# Patient Record
Sex: Male | Born: 1965
Health system: Southern US, Community
[De-identification: ages and names within clinical notes are randomized; demographics above are authoritative.]

## PROBLEM LIST (undated history)

## (undated) DIAGNOSIS — Z803 Family history of malignant neoplasm of breast: Secondary | ICD-10-CM

## (undated) DIAGNOSIS — Z9889 Other specified postprocedural states: Secondary | ICD-10-CM

## (undated) DIAGNOSIS — T4145XA Adverse effect of unspecified anesthetic, initial encounter: Secondary | ICD-10-CM

## (undated) DIAGNOSIS — Z87442 Personal history of urinary calculi: Secondary | ICD-10-CM

## (undated) DIAGNOSIS — M1990 Unspecified osteoarthritis, unspecified site: Secondary | ICD-10-CM

## (undated) DIAGNOSIS — C61 Malignant neoplasm of prostate: Secondary | ICD-10-CM

## (undated) DIAGNOSIS — R112 Nausea with vomiting, unspecified: Secondary | ICD-10-CM

## (undated) DIAGNOSIS — R51 Headache: Secondary | ICD-10-CM

## (undated) DIAGNOSIS — L0390 Cellulitis, unspecified: Secondary | ICD-10-CM

## (undated) DIAGNOSIS — A159 Respiratory tuberculosis unspecified: Secondary | ICD-10-CM

## (undated) DIAGNOSIS — K219 Gastro-esophageal reflux disease without esophagitis: Secondary | ICD-10-CM

## (undated) DIAGNOSIS — T8859XA Other complications of anesthesia, initial encounter: Secondary | ICD-10-CM

## (undated) DIAGNOSIS — T7840XA Allergy, unspecified, initial encounter: Secondary | ICD-10-CM

## (undated) DIAGNOSIS — Z808 Family history of malignant neoplasm of other organs or systems: Secondary | ICD-10-CM

## (undated) DIAGNOSIS — N200 Calculus of kidney: Secondary | ICD-10-CM

## (undated) DIAGNOSIS — E039 Hypothyroidism, unspecified: Secondary | ICD-10-CM

## (undated) DIAGNOSIS — E785 Hyperlipidemia, unspecified: Secondary | ICD-10-CM

## (undated) HISTORY — PX: TONSILLECTOMY: SUR1361

## (undated) HISTORY — DX: Family history of malignant neoplasm of other organs or systems: Z80.8

## (undated) HISTORY — DX: Allergy, unspecified, initial encounter: T78.40XA

## (undated) HISTORY — PX: SPINE SURGERY: SHX786

## (undated) HISTORY — DX: Calculus of kidney: N20.0

## (undated) HISTORY — DX: Family history of malignant neoplasm of breast: Z80.3

## (undated) HISTORY — DX: Hyperlipidemia, unspecified: E78.5

## (undated) HISTORY — PX: NASAL SEPTOPLASTY W/ TURBINOPLASTY: SHX2070

## (undated) HISTORY — PX: ELBOW SURGERY: SHX618

## (undated) HISTORY — DX: Headache: R51

## (undated) HISTORY — DX: Gastro-esophageal reflux disease without esophagitis: K21.9

## (undated) HISTORY — DX: Cellulitis, unspecified: L03.90

## (undated) HISTORY — DX: Unspecified osteoarthritis, unspecified site: M19.90

## (undated) HISTORY — PX: FRACTURE SURGERY: SHX138

---

## 1997-11-17 HISTORY — PX: MICRODISCECTOMY LUMBAR: SUR864

## 1998-11-17 HISTORY — PX: VASECTOMY: SHX75

## 1999-11-07 ENCOUNTER — Ambulatory Visit (HOSPITAL_COMMUNITY): Admission: RE | Admit: 1999-11-07 | Discharge: 1999-11-07 | Payer: Self-pay | Admitting: Internal Medicine

## 1999-11-07 ENCOUNTER — Encounter: Payer: Self-pay | Admitting: Internal Medicine

## 1999-11-27 ENCOUNTER — Encounter: Payer: Self-pay | Admitting: Neurosurgery

## 1999-11-27 ENCOUNTER — Ambulatory Visit (HOSPITAL_COMMUNITY): Admission: RE | Admit: 1999-11-27 | Discharge: 1999-11-28 | Payer: Self-pay | Admitting: Neurosurgery

## 2000-08-23 ENCOUNTER — Encounter: Payer: Self-pay | Admitting: Neurosurgery

## 2000-08-23 ENCOUNTER — Ambulatory Visit (HOSPITAL_COMMUNITY): Admission: RE | Admit: 2000-08-23 | Discharge: 2000-08-23 | Payer: Self-pay | Admitting: Neurosurgery

## 2001-01-04 ENCOUNTER — Other Ambulatory Visit: Admission: RE | Admit: 2001-01-04 | Discharge: 2001-01-04 | Payer: Self-pay | Admitting: Urology

## 2001-01-04 ENCOUNTER — Encounter (INDEPENDENT_AMBULATORY_CARE_PROVIDER_SITE_OTHER): Payer: Self-pay | Admitting: Specialist

## 2002-09-20 ENCOUNTER — Emergency Department (HOSPITAL_COMMUNITY): Admission: EM | Admit: 2002-09-20 | Discharge: 2002-09-20 | Payer: Self-pay

## 2002-11-02 ENCOUNTER — Encounter: Admission: RE | Admit: 2002-11-02 | Discharge: 2002-11-02 | Payer: Self-pay | Admitting: Neurosurgery

## 2002-11-02 ENCOUNTER — Encounter: Payer: Self-pay | Admitting: Neurosurgery

## 2002-11-15 ENCOUNTER — Encounter: Admission: RE | Admit: 2002-11-15 | Discharge: 2002-11-15 | Payer: Self-pay | Admitting: Neurosurgery

## 2002-11-15 ENCOUNTER — Encounter: Payer: Self-pay | Admitting: Neurosurgery

## 2002-11-17 HISTORY — PX: LUMBAR FUSION: SHX111

## 2002-11-28 ENCOUNTER — Encounter: Payer: Self-pay | Admitting: Neurosurgery

## 2002-11-28 ENCOUNTER — Encounter: Admission: RE | Admit: 2002-11-28 | Discharge: 2002-11-28 | Payer: Self-pay | Admitting: Neurosurgery

## 2002-12-20 ENCOUNTER — Encounter: Payer: Self-pay | Admitting: Neurosurgery

## 2002-12-20 ENCOUNTER — Encounter: Admission: RE | Admit: 2002-12-20 | Discharge: 2002-12-20 | Payer: Self-pay | Admitting: Neurosurgery

## 2003-01-16 ENCOUNTER — Inpatient Hospital Stay (HOSPITAL_COMMUNITY): Admission: RE | Admit: 2003-01-16 | Discharge: 2003-01-19 | Payer: Self-pay | Admitting: Neurosurgery

## 2003-01-16 ENCOUNTER — Encounter: Payer: Self-pay | Admitting: Neurosurgery

## 2003-02-20 ENCOUNTER — Encounter: Admission: RE | Admit: 2003-02-20 | Discharge: 2003-02-20 | Payer: Self-pay | Admitting: Neurosurgery

## 2003-02-20 ENCOUNTER — Encounter: Payer: Self-pay | Admitting: Neurosurgery

## 2003-04-18 ENCOUNTER — Encounter: Admission: RE | Admit: 2003-04-18 | Discharge: 2003-04-18 | Payer: Self-pay | Admitting: Neurosurgery

## 2003-04-18 ENCOUNTER — Encounter: Payer: Self-pay | Admitting: Neurosurgery

## 2003-09-11 ENCOUNTER — Encounter: Payer: Self-pay | Admitting: Family Medicine

## 2003-09-11 ENCOUNTER — Encounter: Admission: RE | Admit: 2003-09-11 | Discharge: 2003-09-11 | Payer: Self-pay | Admitting: Family Medicine

## 2004-10-16 ENCOUNTER — Ambulatory Visit: Payer: Self-pay | Admitting: Internal Medicine

## 2004-10-17 ENCOUNTER — Encounter: Admission: RE | Admit: 2004-10-17 | Discharge: 2004-10-17 | Payer: Self-pay | Admitting: Internal Medicine

## 2005-03-20 ENCOUNTER — Encounter: Admission: RE | Admit: 2005-03-20 | Discharge: 2005-03-20 | Payer: Self-pay | Admitting: Neurosurgery

## 2005-03-23 ENCOUNTER — Encounter: Admission: RE | Admit: 2005-03-23 | Discharge: 2005-03-23 | Payer: Self-pay | Admitting: Neurosurgery

## 2005-12-01 ENCOUNTER — Ambulatory Visit: Payer: Self-pay | Admitting: Family Medicine

## 2006-04-22 ENCOUNTER — Emergency Department (HOSPITAL_COMMUNITY): Admission: EM | Admit: 2006-04-22 | Discharge: 2006-04-22 | Payer: Self-pay | Admitting: Emergency Medicine

## 2006-05-05 ENCOUNTER — Ambulatory Visit: Payer: Self-pay | Admitting: Internal Medicine

## 2008-04-02 ENCOUNTER — Emergency Department (HOSPITAL_COMMUNITY): Admission: EM | Admit: 2008-04-02 | Discharge: 2008-04-02 | Payer: Self-pay | Admitting: Family Medicine

## 2008-08-23 ENCOUNTER — Ambulatory Visit (HOSPITAL_BASED_OUTPATIENT_CLINIC_OR_DEPARTMENT_OTHER): Admission: RE | Admit: 2008-08-23 | Discharge: 2008-08-23 | Payer: Self-pay | Admitting: Orthopedic Surgery

## 2008-11-28 ENCOUNTER — Encounter: Payer: Self-pay | Admitting: Internal Medicine

## 2009-01-12 ENCOUNTER — Ambulatory Visit: Payer: Self-pay | Admitting: Internal Medicine

## 2009-01-12 DIAGNOSIS — Z87442 Personal history of urinary calculi: Secondary | ICD-10-CM | POA: Insufficient documentation

## 2009-01-12 DIAGNOSIS — K219 Gastro-esophageal reflux disease without esophagitis: Secondary | ICD-10-CM | POA: Insufficient documentation

## 2009-01-12 DIAGNOSIS — Z9889 Other specified postprocedural states: Secondary | ICD-10-CM | POA: Insufficient documentation

## 2009-01-12 DIAGNOSIS — J309 Allergic rhinitis, unspecified: Secondary | ICD-10-CM | POA: Insufficient documentation

## 2009-01-12 DIAGNOSIS — J45909 Unspecified asthma, uncomplicated: Secondary | ICD-10-CM | POA: Insufficient documentation

## 2009-01-12 DIAGNOSIS — G43009 Migraine without aura, not intractable, without status migrainosus: Secondary | ICD-10-CM | POA: Insufficient documentation

## 2009-01-12 DIAGNOSIS — E785 Hyperlipidemia, unspecified: Secondary | ICD-10-CM | POA: Insufficient documentation

## 2009-01-16 ENCOUNTER — Encounter: Payer: Self-pay | Admitting: Internal Medicine

## 2009-01-22 ENCOUNTER — Encounter (INDEPENDENT_AMBULATORY_CARE_PROVIDER_SITE_OTHER): Payer: Self-pay | Admitting: *Deleted

## 2009-02-08 ENCOUNTER — Encounter: Payer: Self-pay | Admitting: Internal Medicine

## 2009-09-07 ENCOUNTER — Ambulatory Visit: Payer: Self-pay | Admitting: Family Medicine

## 2009-09-07 ENCOUNTER — Encounter: Payer: Self-pay | Admitting: Internal Medicine

## 2009-09-07 DIAGNOSIS — R1013 Epigastric pain: Secondary | ICD-10-CM | POA: Insufficient documentation

## 2009-09-20 ENCOUNTER — Telehealth (INDEPENDENT_AMBULATORY_CARE_PROVIDER_SITE_OTHER): Payer: Self-pay | Admitting: *Deleted

## 2009-09-25 ENCOUNTER — Ambulatory Visit: Payer: Self-pay | Admitting: Internal Medicine

## 2009-09-25 DIAGNOSIS — B353 Tinea pedis: Secondary | ICD-10-CM | POA: Insufficient documentation

## 2009-11-17 HISTORY — PX: KNEE ARTHROSCOPY: SUR90

## 2010-02-07 ENCOUNTER — Inpatient Hospital Stay (HOSPITAL_COMMUNITY): Admission: EM | Admit: 2010-02-07 | Discharge: 2010-02-09 | Payer: Self-pay | Admitting: Emergency Medicine

## 2010-02-28 ENCOUNTER — Ambulatory Visit: Payer: Self-pay | Admitting: Internal Medicine

## 2010-02-28 DIAGNOSIS — R21 Rash and other nonspecific skin eruption: Secondary | ICD-10-CM | POA: Insufficient documentation

## 2010-03-18 ENCOUNTER — Ambulatory Visit: Payer: Self-pay | Admitting: Internal Medicine

## 2010-03-21 ENCOUNTER — Encounter: Payer: Self-pay | Admitting: Internal Medicine

## 2010-03-26 ENCOUNTER — Encounter: Payer: Self-pay | Admitting: Internal Medicine

## 2010-03-27 ENCOUNTER — Ambulatory Visit: Payer: Self-pay | Admitting: Family Medicine

## 2010-03-27 ENCOUNTER — Encounter: Payer: Self-pay | Admitting: Internal Medicine

## 2010-03-27 DIAGNOSIS — L02619 Cutaneous abscess of unspecified foot: Secondary | ICD-10-CM | POA: Insufficient documentation

## 2010-03-27 DIAGNOSIS — L03119 Cellulitis of unspecified part of limb: Secondary | ICD-10-CM

## 2010-04-03 ENCOUNTER — Telehealth: Payer: Self-pay | Admitting: Internal Medicine

## 2010-04-03 ENCOUNTER — Encounter: Payer: Self-pay | Admitting: Internal Medicine

## 2010-04-10 ENCOUNTER — Encounter: Payer: Self-pay | Admitting: Internal Medicine

## 2010-05-17 ENCOUNTER — Encounter: Payer: Self-pay | Admitting: Internal Medicine

## 2010-08-16 ENCOUNTER — Encounter: Payer: Self-pay | Admitting: Internal Medicine

## 2010-08-25 ENCOUNTER — Emergency Department (HOSPITAL_COMMUNITY): Admission: EM | Admit: 2010-08-25 | Discharge: 2010-08-25 | Payer: Self-pay | Admitting: Emergency Medicine

## 2010-09-11 ENCOUNTER — Telehealth: Payer: Self-pay | Admitting: Internal Medicine

## 2010-09-23 ENCOUNTER — Encounter: Payer: Self-pay | Admitting: Internal Medicine

## 2010-12-09 ENCOUNTER — Encounter: Payer: Self-pay | Admitting: Internal Medicine

## 2010-12-15 LAB — CONVERTED CEMR LAB
AST: 39 units/L — ABNORMAL HIGH (ref 0–37)
Alkaline Phosphatase: 52 units/L (ref 39–117)
BUN: 11 mg/dL (ref 6–23)
BUN: 12 mg/dL (ref 6–23)
Basophils Absolute: 0 10*3/uL (ref 0.0–0.1)
Basophils Relative: 0.8 % (ref 0.0–3.0)
CO2: 31 meq/L (ref 19–32)
CO2: 32 meq/L (ref 19–32)
Chloride: 102 meq/L (ref 96–112)
Cholesterol, target level: 200 mg/dL
Cholesterol: 229 mg/dL — ABNORMAL HIGH (ref 0–200)
Creatinine, Ser: 0.9 mg/dL (ref 0.4–1.5)
Eosinophils Absolute: 0.3 10*3/uL (ref 0.0–0.7)
Eosinophils Relative: 3 % (ref 0–5)
Eosinophils Relative: 5.4 % — ABNORMAL HIGH (ref 0.0–5.0)
GFR calc Af Amer: 119 mL/min
GFR calc non Af Amer: 98 mL/min
Glucose, Bld: 93 mg/dL (ref 70–99)
HCT: 44 % (ref 39.0–52.0)
HCT: 44.5 % (ref 39.0–52.0)
HCT: 45.7 % (ref 39.0–52.0)
HDL: 40.7 mg/dL (ref >39.00)
Helicobacter Pylori Antibody-IgG: <0.4
Hemoglobin: 15.1 g/dL (ref 13.0–17.0)
Hemoglobin: 15.8 g/dL (ref 13.0–17.0)
Indirect Bilirubin: 0.4 mg/dL (ref 0.0–0.9)
LDL Goal: 100 mg/dL
Lymphocytes Relative: 27 % (ref 12–46)
Lymphs Abs: 2.1 10*3/uL (ref 0.7–4.0)
MCHC: 34.3 g/dL (ref 30.0–36.0)
MCV: 89.6 fL (ref 78.0–100.0)
Monocytes Relative: 15.2 % — ABNORMAL HIGH (ref 3.0–12.0)
Monocytes Relative: 9.9 % (ref 3.0–12.0)
Neutro Abs: 4.5 10*3/uL (ref 1.7–7.7)
Neutrophils Relative %: 34.9 % — ABNORMAL LOW (ref 43.0–77.0)
Platelets: 322 10*3/uL (ref 150–400)
Potassium: 4.2 meq/L (ref 3.5–5.1)
RBC: 4.83 M/uL (ref 4.22–5.81)
RBC: 5.06 M/uL (ref 4.22–5.81)
RDW: 12.8 % (ref 11.5–14.6)
Sodium: 140 meq/L (ref 135–145)
TSH: 1.55 microintl units/mL (ref 0.35–5.50)
TSH: 2.05 microintl units/mL (ref 0.35–5.50)
Total Bilirubin: 0.5 mg/dL (ref 0.3–1.2)
Total Bilirubin: 0.8 mg/dL (ref 0.3–1.2)
Total CHOL/HDL Ratio: 6
Total Protein: 6.6 g/dL (ref 6.0–8.3)
Triglycerides: 159 mg/dL — ABNORMAL HIGH (ref 0.0–149.0)
WBC: 4.7 10*3/uL (ref 4.5–10.5)
WBC: 7.8 10*3/uL (ref 4.5–10.5)
WBC: 8 10*3/uL (ref 4.0–10.5)

## 2010-12-19 NOTE — Assessment & Plan Note (Signed)
Summary: RT FOOT INFECTION/RH.......[name]   Vital Signs:  Patient profile:   45 year old male Weight:      186 pounds Temp:     98.2 degrees F oral BP sitting:   120 / 80  (left arm)  Vitals Entered By: Doristine Devoid (Mar 27, 2010 11:27 AM) CC: R foot infection- used ketoconzale cream which made worse some itching and discomfort- hx of recent staph infection    History of Present Illness: 45 yo man here today for R foot infxn.  in March flew to [place] and 'my foot blew up'.  had severe staph infxn.  April had recurrent infxn.  last week had CPE- has derm appt upcoming.  area is now oozing, hot, itching, burning.  + swelling.  took Bactrim in April w/ resolution of sxs.  used Ketoconazole cream and felt top of foot was 'on fire'.  area now again 'flared up'.  no fevers.  Current Medications (verified): 1)  Allegra 180 Mg Tabs (Fexofenadine Hcl) .[name].. 1 By Mouth Once Daily 2)  Aspirin Adult Low Strength 81 Mg Tbec (Aspirin) .[name].. 1 By Mouth Once Daily 3)  Nasonex 50 Mcg/act Susp (Mometasone Furoate) .... As Needed 4)  Proair Hfa 108 (90 Base) Mcg/act Aers (Albuterol Sulfate) .... As Needed 5)  Epipen 0.3 Mg/0.21ml (1:1000) Devi (Epinephrine Hcl (Anaphylaxis)) .... As Needed 6)  Ibuprofen 800 Mg Tabs (Ibuprofen) .[name].. 1 By Mouth Once Daily 7)  Allergy Injections .[name].. 1 Injection 2 X Weekly 8)  Ranitidine Hcl 150 Mg Tabs (Ranitidine Hcl) .[name].. 1 Two Times A Day Ac Meals 9)  Maxalt-Mlt 10 Mg Tbdp (Rizatriptan Benzoate) .[name].. 1 As Needed 10)  Pravastatin Sodium 20 Mg Tabs (Pravastatin Sodium) .[name].. 1 At Bedtime 11)  Cephalexin 500 Mg  Tabs (Cephalexin) .... Take One By Mouth Two Times A Day X 10 Days  Allergies (verified): No Known Drug Allergies  Review of Systems      See HPI  Physical Exam  General:  well-nourished, alert,appropriate and cooperative throughout examination Pulses:  +2 DP pulses Extremities:  + erythema, induration, some vesicles.  + serous drainage.   Impression &  Recommendations:  Problem # 1:  CELLULITIS, FOOT (ICD-682.7) Assessment New pt w/ apparent re-infxn of dorsum of R foot.  doesn't appear to be MRSA.  cx in hospital was (-) for MRSA.  will not refill Bactrim as pt stopped this 2 weeks ago.  need to save MRSA fighting meds.  will start Keflex.  reviewed supportive care and red flags that should prompt return.  Pt expresses understanding and is in agreement w/ this plan. His updated medication list for this problem includes:    Cephalexin 500 Mg Tabs (Cephalexin) .[name]... Take one by mouth two times a day x 10 days  Complete Medication List: 1)  Allegra 180 Mg Tabs (Fexofenadine hcl) .[name].. 1 by mouth once daily 2)  Aspirin Adult Low Strength 81 Mg Tbec (Aspirin) .[name].. 1 by mouth once daily 3)  Nasonex 50 Mcg/act Susp (Mometasone furoate) .... As needed 4)  Proair Hfa 108 (90 Base) Mcg/act Aers (Albuterol sulfate) .... As needed 5)  Epipen 0.3 Mg/0.31ml (1:1000) Devi (Epinephrine hcl (anaphylaxis)) .... As needed 6)  Ibuprofen 800 Mg Tabs (Ibuprofen) .[name].. 1 by mouth once daily 7)  Allergy Injections  .[name].. 1 injection 2 x weekly 8)  Ranitidine Hcl 150 Mg Tabs (Ranitidine hcl) .[name].. 1 two times a day ac meals 9)  Maxalt-mlt 10 Mg Tbdp (Rizatriptan benzoate) .[name].. 1 as  needed 10)  Pravastatin Sodium 20 Mg Tabs (Pravastatin sodium) .[name].. 1 at bedtime 11)  Cephalexin 500 Mg Tabs (Cephalexin) .... Take one by mouth two times a day x 10 days  Patient Instructions: 1)  Start the antibiotics today- take with food 2)  Take oral benadryl for a possible allergic component 3)  DO NOT smear any creams on your foot 4)  If your redness is spreading, streaking, you develop fevers or other concerns- please call or go to the ER 5)  HANG IN THERE!! Prescriptions: CEPHALEXIN 500 MG  TABS (CEPHALEXIN) take one by mouth two times a day x 10 days  #20 x 0   Entered and Authorized by:   Neena Rhymes MD   Signed by:   Neena Rhymes MD on 03/27/2010   Method used:    Electronically to        [facility] [address] (retail)       [address]       [place], [place]  [address]       Ph: [phone]       Fax: [phone]   RxID:   [id number]

## 2010-12-19 NOTE — Letter (Signed)
Summary: [facility]  [facility]   Imported By: Lanelle Bal 04/01/2010 12:26:27  _____________________________________________________________________  External Attachment:    Type:   Image     Comment:   External Document

## 2010-12-19 NOTE — Consult Note (Signed)
Summary: [facility]   Imported By: Lanelle Bal 05/29/2010 09:26:11  _____________________________________________________________________  External Attachment:    Type:   Image     Comment:   External Document

## 2010-12-19 NOTE — Letter (Signed)
Summary: [facility]  [facility]   Imported By: Lanelle Bal 10/18/2010 11:43:28  _____________________________________________________________________  External Attachment:    Type:   Image     Comment:   External Document

## 2010-12-19 NOTE — Letter (Signed)
Summary: E Mail Regarding Dermatology Consult  E Mail Regarding Dermatology Consult   Imported By: Lanelle Bal 04/20/2010 09:12:36  _____________________________________________________________________  External Attachment:    Type:   Image     Comment:   External Document

## 2010-12-19 NOTE — Progress Notes (Signed)
Summary: earlier appt  Phone Note Call from Patient Call back at [phone] CELL [phone]   Caller: Patient Summary of Call: PT wife is REQUEST A EARLIER APPT for pt  DUE TO INFECTION RECURRENT. Pt is on his third round of antibiotic and was almost hospitalized on friday. Pt wife would like for dr Sacheen Arrasmith to try to get pt in earlier .pls advise.....................Marland KitchenFelecia Deloach CMA  Apr 03, 2010 10:17 AM   Follow-up for Phone Call        He gets recurrent rash on his foot followed by secondary cellulitis. He was hospitalized X 1 & almost a second time. His wife is Engineer, civil (consulting); he is Emergency planning/management officer; very intelligent & motivated. Appt ASAP please. Will MD to MD call help? Thanks Follow-up by: Marga Melnick MD,  Apr 03, 2010 11:47 AM  Additional Follow-up for Phone Call Additional follow up Details #1::        I placed a Triage call to [facility], awaiting call back with their physician's approval for pt to be worked in earlier. Magdalen Spatz [facility]  Apr 03, 2010 11:56 AM     Additional Follow-up for Phone Call Additional follow up Details #2::    PATIENT'S APPT HAS BEEN MOVED UP TO 04-09-2010 ARRIVING NO LATER THAN 3:30PM WITH DR DAN Yetta Barre, I S/W PT'S WIFE SHE IS Beverley Fiedler [facility]  Apr 04, 2010 8:54 AM   Follow-up by: Marga Melnick MD,  Apr 04, 2010 6:27 PM

## 2010-12-19 NOTE — Letter (Signed)
Summary: [facility]  [facility]   Imported By: Lanelle Bal 04/01/2010 12:30:39  _____________________________________________________________________  External Attachment:    Type:   Image     Comment:   External Document

## 2010-12-19 NOTE — Letter (Signed)
Summary: E Mail with Patient Concerns  E Mail with Patient Concerns   Imported By: Lanelle Bal 04/10/2010 08:20:18  _____________________________________________________________________  External Attachment:    Type:   Image     Comment:   External Document

## 2010-12-19 NOTE — Letter (Signed)
Summary: [facility]  [facility]   Imported By: Lanelle Bal 09/10/2010 10:59:26  _____________________________________________________________________  External Attachment:    Type:   Image     Comment:   External Document
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
## 2010-12-19 NOTE — Assessment & Plan Note (Signed)
Summary: [facility]   Vital Signs:  Patient profile:   45 year old male Weight:      185 pounds BMI:     25.53 Pulse rate:   60 / minute Resp:     15 per minute BP sitting:   106 / 70  (left arm) Cuff size:   large  Vitals Entered By: Shonna Chock (February 28, 2010 11:27 AM) CC: Hospital Follow-up: patient was doing ok then 2-3 days ago symptoms with right foot restarted(Staph Infection), patient contacted the ER and was RX'ed another round of Bactrim for 14days. Patient also noticed an area of concern on his left foot Comments REVIEWED MED LIST, PATIENT AGREED DOSE AND INSTRUCTION CORRECT    CC:  Hospital Follow-up: patient was doing ok then 2-3 days ago symptoms with right foot restarted(Staph Infection) and patient contacted the ER and was RX'ed another round of Bactrim for 14days. Patient also noticed an area of concern on his left foot.  History of Present Illness: D/C Summary reviewed ; 03/24-26/2011 for parenteral antibiotics  for cellulitis of R foot. The rash essentially cleared until 04/12  when it began to recur as erythema. PMH of contact dermatitis with adhesive medical dressings in 2001, Neosporin, anti fungal cream  & reaction to sutures   Allergies (verified): No Known Drug Allergies  Review of Systems General:  Denies chills, fever, and sweats. CV:  Denies bluish discoloration of lips or nails and leg cramps with exertion. Derm:  Complains of changes in color of skin and itching; No purulence. Allergy:  Denies itching eyes and sneezing; On allergy shots from Dr Barnetta Chapel.  Physical Exam  General:  in no acute distress; alert,appropriate and cooperative throughout examination Abdomen:  Bowel sounds positive,abdomen soft and non-tender without masses, organomegaly or hernias noted. Pulses:  R and L femoral,dorsalis pedis and posterior tibial pulses are full and equal bilaterally Extremities:  No clubbing, cyanosis, edema. Good nail health Skin:  7X 11 cm  erythema with blanching to pressure over  R dorsal foot w/o increased temp  Cervical Nodes:  No lymphadenopathy noted Axillary Nodes:  No palpable lymphadenopathy Inguinal Nodes:  No significant adenopathy   Impression & Recommendations:  Problem # 1:  RASH-NONVESICULAR (ICD-782.1)  PMH of contact dermatitis with multiple agents; clinically not cellulitis  Orders: Dermatology Referral (Derma)  Complete Medication List: 1)  Allegra 180 Mg Tabs (Fexofenadine hcl) .[name].. 1 by mouth once daily 2)  Aspirin Adult Low Strength 81 Mg Tbec (Aspirin) .[name].. 1 by mouth once daily 3)  Nasonex 50 Mcg/act Susp (Mometasone furoate) .... As needed 4)  Proair Hfa 108 (90 Base) Mcg/act Aers (Albuterol sulfate) .... As needed 5)  Epipen 0.3 Mg/0.46ml (1:1000) Devi (Epinephrine hcl (anaphylaxis)) .... As needed 6)  Ibuprofen 800 Mg Tabs (Ibuprofen) .[name].. 1 by mouth once daily 7)  Allergy Injections  .[name].. 1 injection 2 x weekly 8)  Ranitidine Hcl 150 Mg Tabs (Ranitidine hcl) .[name].. 1 two times a day ac meals 9)  Maxalt-mlt 10 Mg Tbdp (Rizatriptan benzoate) .[name].. 1 as needed 10)  Bactrim Ds 800-160 Mg Tabs (Sulfamethoxazole-trimethoprim) .[name].. 1 by mouth two times a day x 14 days. started: 02/26/10  Patient Instructions: 1)  Wear sandals weather permitting until rash resolves. Mix Eucerin1 part to 1 part Cortaid  & apply two times a day for itching as needed . Wet to dry saline two times a day- three times a day

## 2010-12-19 NOTE — Progress Notes (Signed)
Summary: FYI:   Phone Note Call from Patient   Caller: Patient Summary of Call: Message left on VM: Patient went to [facility] yesterday for an anaphylactic reaction to an allergy injection. Patient was told to contact his primary care doctor.    I called the patient to inform him we got his message. Patient indicated he has a f/u with Dr.Whelan  Shonna Chock CMA  September 11, 2010 1:09 PM   Follow-up for Phone Call        noted Follow-up by: Marga Melnick MD,  September 11, 2010 2:14 PM

## 2010-12-20 NOTE — Letter (Signed)
Summary: [facility]  [facility]   Imported By: Lanelle Bal 04/01/2010 12:27:56  _____________________________________________________________________  External Attachment:    Type:   Image     Comment:   External Document

## 2010-12-25 NOTE — Letter (Signed)
Summary: [facility]  [facility]   Imported By: Lanelle Bal 12/19/2010 08:09:59  _____________________________________________________________________  External Attachment:    Type:   Image     Comment:   External Document

## 2011-01-30 LAB — URINALYSIS, ROUTINE W REFLEX MICROSCOPIC
Bilirubin Urine: NEGATIVE
Specific Gravity, Urine: 1.004 — ABNORMAL LOW (ref 1.005–1.030)
Urobilinogen, UA: 0.2 mg/dL (ref 0.0–1.0)
pH: 6.5 (ref 5.0–8.0)

## 2011-02-10 LAB — CBC
HCT: 44.6 % (ref 39.0–52.0)
MCHC: 34.3 g/dL (ref 30.0–36.0)
MCV: 90.7 fL (ref 78.0–100.0)
Platelets: 287 10*3/uL (ref 150–400)
WBC: 8.4 10*3/uL (ref 4.0–10.5)

## 2011-02-10 LAB — WOUND CULTURE: Gram Stain: NONE SEEN

## 2011-02-10 LAB — DIFFERENTIAL
Basophils Relative: 1 % (ref 0–1)
Eosinophils Relative: 6 % — ABNORMAL HIGH (ref 0–5)
Lymphocytes Relative: 22 % (ref 12–46)
Neutro Abs: 4.9 10*3/uL (ref 1.7–7.7)
Neutrophils Relative %: 58 % (ref 43–77)

## 2011-02-10 LAB — BASIC METABOLIC PANEL
BUN: 12 mg/dL (ref 6–23)
CO2: 31 mEq/L (ref 19–32)
Calcium: 8.6 mg/dL (ref 8.4–10.5)
Creatinine, Ser: 1.09 mg/dL (ref 0.4–1.5)
Potassium: 3.6 mEq/L (ref 3.5–5.1)
Sodium: 136 mEq/L (ref 135–145)

## 2011-04-01 NOTE — Op Note (Signed)
NAMECOULTER, OLDAKER NO.:  [id number]   MEDICAL RECORD NO.:  [id number]          PATIENT TYPE:  AMB   LOCATION:  [facility]                         FACILITY:  [facility]   PHYSICIAN:  Ollen Gross, M.D.    DATE OF BIRTH:  02-Sep-1966   DATE OF PROCEDURE:  08/23/2008  DATE OF DISCHARGE:                               OPERATIVE REPORT   PREOPERATIVE DIAGNOSES:  Right knee patellar chondral defect.   POSTOPERATIVE DIAGNOSES:  Right knee patellar chondral defect.   PROCEDURE:  Right-knee arthroscopy with chondroplasty patella.   SURGEON:  Ollen Gross, M.D.   ASSISTANT:  None.   ANESTHESIA:  General.   ESTIMATED BLOOD LOSS:  Minimal.   DRAINS:  None.   COMPLICATIONS:  None.   CONDITION:  Stable to recovery.   BRIEF CLINICAL NOTE:  Josph is a 45 year old male who has had a several-  month history of progressively worsening right knee pain and mechanical  type symptoms.  He has had extensive physical therapy without  improvement.  Most of his symptoms appear referable to the  patellofemoral joint.  He has been unable to return to his occupation  because of the pain and dysfunction.  He presents now for arthroscopy  and debridement.   PROCEDURE IN DETAIL:  After successful administration of general  anesthetic, a tourniquet was placed high on the right thigh and right  lower extremity prepped and draped in the usual sterile fashion.  Standard superomedial and inferolateral incisions were made.  Inflow  cannula passed, superomedial camera passed, inferolateral.  Arthroscopic  visualization proceeds.   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  There is also some associated  hypertrophic fat and synovial tissue just adjacent to the junction of  the quad tendon and the patella and also at the inferior aspect of the  junction of the patellar tendon and patella.  The trochlea looks  completely  normal.  The medial and lateral gutters are visualized.  There are no loose bodies.  Flexion in valgus force is applied to the  knee and the medial compartment is entered.  The medial meniscus looks  normal, as does the medial femoral condyle and tibial plateau.  The  spinal needle is used to localize the inferomedial portal, a small  incision made, dilator placed.  A probe is placed and the meniscus  probes normally.  The intercondylar notch is visualized.  The ACL is  normal.  Lateral compartment is entered and it looks normal also.   I again addressed the patellofemoral joint.  Using the 4.2 mm shaver,  the area of chondromalacia, superior and inferior, is debrided back to a  stable cartilaginous base.  There is no exposed bone.  I then used the  ArthroCare device to debride the hypertrophic tissue just adjacent to  the superior and inferior pole of the patella, which potentially was  impinging in flexion.  This hypertrophic tissue is removed.  Superolateral, there is also what appears to be a plica and that is  excised.  The joint is again inspected.  There are no further tears or  loose bodies.  The patella is centrally located in the trochlea.  Thus a  lateral release was not necessary.   Once the impinging tissue was removed, then I removed the arthroscopic  equipment from the inferior portals, which were closed with interrupted  4-0 nylon.  Then 20 mL of 0.25% Marcaine with epinephrine were injected  for the inflow cannula, then that was removed and that portal closed  with nylon.  A bulky sterile dressing is applied and he is then awakened  and transported to recovery in stable condition.      Ollen Gross, M.D.  Electronically Signed     FA/MEDQ  D:  08/23/2008  T:  08/23/2008  Job:  [id number]
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
## 2011-04-04 NOTE — Op Note (Signed)
[place]  Patient:    Aaron Gomez                          MRN: [id number] Proc. Date: 11/27/99 Adm. Date:  [id number] Attending:  Donn Pierini                           Operative Report  PREOPERATIVE DIAGNOSES:  1. Left L4-5 extra foraminal herniated nucleus pulposus with radiculopathy. 2.  Left L5-S1 herniated nucleus pulposus with a superior free fragment causing  radiculopathy.  POSTOPERATIVE DIAGNOSES:  1. Left L4-5 extra foraminal herniated nucleus pulposus with radiculopathy. 2.  Left L5-S1 herniated nucleus pulposus with a superior free fragment causing  radiculopathy.  OPERATION:  Left L4-5 extra foraminal microdiskectomy. Left L5-S1 laminotomy and microdiskectomy.  SURGEON:  Julio Sicks, M.D.  ASSISTANT:  Bradd Canary., M.D.  ANESTHESIA:  General endotracheal.  INDICATION FOR PROCEDURE:  Mr. Savant is a 45 year old white male with a history of back and left lower extremity pain and paresthesias consistent with both a left L4 and L5 radiculopathy and some intermittent symptoms of left S1 radiculopathy. he patient has failed efforts of conservative management over the past six months.  MRI scan demonstrates a focal extra foraminal disc herniation with compression f the left sided L4 nerve root at L4-5.  The patient also has evidence of a left sided L5-S1 disc herniation with a superior free fragment causing compression of the left sided L5 nerve root.  The patient has been counselled as to his options and wishes to proceed with lumbar laminotomy and microdiskectomy at L5-S1 on the left and extra foraminal microdiskectomy at L4-5 on the left for hopeful relief of his symptoms.  DESCRIPTION OF PROCEDURE:  The patient was taken to the operating room and placed on the operating table in the supine position.  After endotracheal anesthesia, he patient positioned prone on to a Wilson frame properly  padded.  The patients lumbar region is shaved and prepped sterilely.  A ten blade is used to make a linear skin incision overlying the L4-5 interspace.  This was carried down sharply in the midline. Subperiosteal dissection then performed on the left side exposing the lamina and facet joints at L4, L5 and S1.  The transverse processes of L4 was also exposed. Deep self retaining retractor was placed.  X-ray was taken.  The levels were confirmed.  Laminotomy was then performed at L5-S1 using high speed  drill and Kerrison rongeurs to move the inferior one-third of the lamina of L5. The medial one-third of the L5-S2 fact and the superior rim of the sacrum. The ligamentum flavum was elevated and resected in a piece meal fashion using Kerrison rongeurs.  An extra foraminal approach was then performed at L4-5 by using the igh speed drill and Kerrison rongeurs to remove the inferior aspect of the proximal  transverse process and pedicle.  Also the superior lateral aspect of the superior facet of L5 was also resected.  Soft tissue was then elevated and resected in a  piece meal fashion using Kerrison rongeurs.  Underlying L4 nerve root was identified.  Using the microscope for microdissection of the left sided L4 nerve root, the nerve root was mobilized and retracted superiorly.  Venous and fatty structures were coagulated and cut.  Disc herniation was isolated and incised in a 15 blade rectangular fashion.  A very laterally  placed disc herniation which was very tough and adherent was dissected free and resected in a piece meal fashion  using Kerrison rongeurs and pituitary rongeurs.  All elements of the disc herniation were resected.  Bone clamps passed easily along the course of the left sided L4 nerve root.  There was no evidence of injury to the nerve root. Gelfoam was placed topically for hemostasis and attention was then placed to L5-S1. The L5-S1 interspace was identified.  Thecal sac and S2 nerve root were immobilized nd retracted towards the midline. Epidural venous plexus was coagulated and cut. he disc space was isolated and incised with a 15 blade in a rectangular fashion. A  wide disc space clean out was then achieved using pituitary rongeurs, upward angle pituitary rongeurs and Ebstein curets.  A superior fragment mixed with osteophyte was then encountered and dissected free using microinstruments and Ebstein curets. This completely decompressed the left sided L5 nerve root.  At this point, good  decompression of the left L4, L5 and S1 nerve roots had been performed.  The wound was then copiously irrigated with antibiotic solution. Gelfoam was placed topically for hemostasis which was found to be good.  Microscope and retractor system were removed.  Hemostasis of the muscle was achieved with electrocautery. The wound as then closed in layers with Vicryl sutures. Staples were applied to the surface.  There were no apparent complications.  The patient tolerated the procedure well and he returns to the recovery room postoperatively. DD:  11/27/99 TD:  11/27/99 Job: [id number]
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
## 2011-04-18 ENCOUNTER — Other Ambulatory Visit: Payer: Self-pay | Admitting: Internal Medicine

## 2011-05-20 ENCOUNTER — Encounter: Payer: Self-pay | Admitting: Internal Medicine

## 2011-05-23 ENCOUNTER — Encounter: Payer: Self-pay | Admitting: Internal Medicine

## 2011-05-23 ENCOUNTER — Ambulatory Visit (INDEPENDENT_AMBULATORY_CARE_PROVIDER_SITE_OTHER): Payer: BC Managed Care – PPO | Admitting: Internal Medicine

## 2011-05-23 VITALS — BP 104/68 | HR 56 | Temp 98.4°F | Resp 12 | Ht 72.0 in | Wt 178.6 lb

## 2011-05-23 DIAGNOSIS — Z0000 Encounter for general adult medical examination without abnormal findings: Secondary | ICD-10-CM

## 2011-05-23 DIAGNOSIS — E785 Hyperlipidemia, unspecified: Secondary | ICD-10-CM

## 2011-05-23 DIAGNOSIS — T782XXA Anaphylactic shock, unspecified, initial encounter: Secondary | ICD-10-CM

## 2011-05-23 DIAGNOSIS — J309 Allergic rhinitis, unspecified: Secondary | ICD-10-CM

## 2011-05-23 LAB — CBC WITH DIFFERENTIAL/PLATELET
Basophils Relative: 0.5 % (ref 0.0–3.0)
Eosinophils Relative: 2.2 % (ref 0.0–5.0)
Hemoglobin: 15.5 g/dL (ref 13.0–17.0)
Lymphocytes Relative: 30.1 % (ref 12.0–46.0)
MCHC: 34.6 g/dL (ref 30.0–36.0)
Monocytes Relative: 9.6 % (ref 3.0–12.0)
Neutro Abs: 4.5 10*3/uL (ref 1.4–7.7)
Neutrophils Relative %: 57.6 % (ref 43.0–77.0)
Platelets: 280 10*3/uL (ref 150.0–400.0)
RBC: 4.92 Mil/uL (ref 4.22–5.81)
RDW: 12.4 % (ref 11.5–14.6)
WBC: 7.7 10*3/uL (ref 4.5–10.5)

## 2011-05-23 LAB — LIPID PANEL
HDL: 51.1 mg/dL (ref >39.00)
LDL Cholesterol: 91 mg/dL (ref 0–99)
VLDL: 35.4 mg/dL (ref 0.0–40.0)

## 2011-05-23 LAB — BASIC METABOLIC PANEL
CO2: 31 mEq/L (ref 19–32)
Creatinine, Ser: 1.1 mg/dL (ref 0.4–1.5)
Glucose, Bld: 85 mg/dL (ref 70–99)

## 2011-05-23 LAB — HEPATIC FUNCTION PANEL
Bilirubin, Direct: 0 mg/dL (ref 0.0–0.3)
Total Bilirubin: 0.4 mg/dL (ref 0.3–1.2)

## 2011-05-23 MED ORDER — PRAVASTATIN SODIUM 20 MG PO TABS: 20.0000 mg | ORAL_TABLET | Freq: Every day | ORAL | Status: DC

## 2011-05-23 NOTE — Patient Instructions (Signed)
Preventive Health Care: Exercise at least 30-45 minutes a day,  3-4 days a week.  Eat a low-fat diet with lots of fruits and vegetables, up to 7-9 servings per day. Consume less than 40 grams of sugar per day from foods & drinks with High Fructose Corn Sugar as #2,3 or # 4 on label. Health Care Power of Attorney & Living Will. Complete if not in place ; these place you in charge of your health care decisions. 

## 2011-05-23 NOTE — Progress Notes (Signed)
Subjective:    Patient ID: Aaron Gomez, male    DOB: 1966/07/08, 45 y.o.   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      Review of Systems Patient reports no  Significant vision/ hearing changes,anorexia, weight change, fever ,adenopathy, persistant / recurrent hoarseness, swallowing issues, chest pain,palpitations, edema,persistant / recurrent cough, hemoptysis, dyspnea(rest, exertional, paroxysmal nocturnal), gastrointestinal  bleeding (melena, rectal bleeding), abdominal pain, excessive heart burn, GU symptoms( dysuria, hematuria, pyuria, voiding  issues) syncope, focal weakness,numbness & tingling, hair/nail changes,depression, anxiety, abnormal bruising/bleeding, or  musculoskeletal symptoms/signs.      Objective:   Physical Exam Gen.: Healthy and well-nourished in appearance. Alert, appropriate and cooperative throughout exam. Head: Normocephalic without obvious abnormalities;  no alopecia  Eyes: No corneal or conjunctival inflammation noted. Pupils equal round reactive to light and accommodation. Fundal exam is benign without hemorrhages, exudate, papilledema. Extraocular motion intact. Vision grossly normal. Ears: External  ear exam reveals no significant lesions or deformities. Canals clear .TMs normal. Hearing is grossly normal bilaterally. Nose: External nasal exam reveals no deformity or inflammation. Nasal mucosa are pink and moist. No lesions or exudates noted. Septum : no deviation Mouth: Oral mucosa and oropharynx reveal no lesions or exudates. Teeth in good repair. Neck: No deformities, masses, or tenderness noted. Range of motion  normal. Thyroid R lobe physiologically larger than L & firm w/o nodules. ?cervical rib Lungs: Normal respiratory effort; chest expands symmetrically. Lungs are clear to auscultation without rales, wheezes, or increased work of breathing. Heart:  Slow rate and regular  rhythm.  Normal S1 and S2. No gallop, click, or rub. No  murmur. Abdomen: Bowel sounds normal; abdomen soft and nontender. No masses, organomegaly or hernias noted. Genitalia/ DRE: prostate ULN w/o nodules. L varicocele.   .                                                                                   Musculoskeletal/extremities: No deformity or scoliosis noted of  the thoracic or lumbar spine. No clubbing, cyanosis, edema, or deformity noted. Range of motion  normal .Tone & strength  normal.Joints normal. Nail health  good. Vascular: Carotid, radial artery, dorsalis pedis and  posterior tibial pulses are full and equal. No bruits present. Neurologic: Alert and oriented x3. Deep tendon reflexes symmetrical and normal.          Skin: dry , slightly exfoliative rash base R great toe Lymph: No cervical, axillary, or inguinal lymphadenopathy present. Psych: Mood and affect are normal. Normally interactive                                                                                        Assessment & Plan:  #1 comprehensive physical exam; no acute findings #2 see Problem  List with Assessments & Recommendations Plan: see Orders

## 2011-11-12 ENCOUNTER — Telehealth: Payer: Self-pay | Admitting: Internal Medicine

## 2011-11-12 MED ORDER — OSELTAMIVIR PHOSPHATE 75 MG PO CAPS: 75.0000 mg | ORAL_CAPSULE | Freq: Two times a day (BID) | ORAL | Status: AC

## 2011-11-12 NOTE — Telephone Encounter (Signed)
Tamiflu 75 mg one twice a day; dispense 10

## 2011-11-12 NOTE — Telephone Encounter (Signed)
Done. Patient informed

## 2011-11-12 NOTE — Telephone Encounter (Signed)
Patient has temp - body ache  & headache - he is a Emergency planning/management officer in [employer] & saw city nurse she suggested he get rx for tama-flu -she said she saw about 4 other people with same symptoms today [facility]

## 2012-02-02 ENCOUNTER — Ambulatory Visit (INDEPENDENT_AMBULATORY_CARE_PROVIDER_SITE_OTHER): Payer: BC Managed Care – PPO | Admitting: Internal Medicine

## 2012-02-02 ENCOUNTER — Encounter: Payer: Self-pay | Admitting: Internal Medicine

## 2012-02-02 VITALS — BP 124/86 | HR 62 | Temp 98.1°F | Resp 14 | Ht 72.0 in | Wt 184.0 lb

## 2012-02-02 DIAGNOSIS — Z23 Encounter for immunization: Secondary | ICD-10-CM

## 2012-02-02 DIAGNOSIS — Z0000 Encounter for general adult medical examination without abnormal findings: Secondary | ICD-10-CM

## 2012-02-02 DIAGNOSIS — E785 Hyperlipidemia, unspecified: Secondary | ICD-10-CM

## 2012-02-02 NOTE — Patient Instructions (Addendum)
Preventive Health Care: Exercise at least 30-45 minutes a day,  3-4 days a week.  Eat a low-fat diet with lots of fruits and vegetables, up to 7-9 servings per day. Consume less than 40 grams of sugar per day from foods & drinks with High Fructose Corn Sugar as # 1,2,3 or # 4 on label. Health Care Power of Attorney & Living Will. Complete if not in place ; these place you in charge of your health care decisions. Please take enteric-coated aspirin 81 mg daily with breakfast.

## 2012-02-02 NOTE — Progress Notes (Signed)
  Subjective:    Patient ID: Aaron Gomez, male    DOB: September 10, 1966, 46 y.o.   MRN: [id number]  HPI  Mr. Czarnecki is here for a physical; he denies acute issues except for recent blistering of soles after 14 mile hike with heavy backpack.      Review of Systems Patient reports no  vision/ hearing changes,anorexia, weight change, fever ,adenopathy, persistant / recurrent hoarseness, swallowing issues, chest pain,palpitations, edema,persistant / recurrent cough, hemoptysis, dyspnea(rest, exertional, paroxysmal nocturnal), gastrointestinal  bleeding (melena, rectal bleeding), abdominal pain, excessive heart burn, GU symptoms( dysuria, hematuria, pyuria, voiding/incontinence  issues) syncope, focal weakness, memory loss,numbness & tingling, skin/hair/nail changes,depression, anxiety, abnormal bruising/bleeding,or  musculoskeletal symptoms/signs.   [employer] [place] has requested an A1c. His glucoses have ranged from 85-89; this would correspond to an A1c less than 4.8%.     Objective:   Physical Exam Gen.: Healthy and well-nourished in appearance. Alert, appropriate and cooperative throughout exam. Head: Normocephalic without obvious abnormalities; no alopecia  Eyes: No corneal or conjunctival inflammation noted. Pupils equal round reactive to light and accommodation. Fundal exam is benign without hemorrhages, exudate, papilledema. Extraocular motion intact. Vision grossly normal. Ears: External  ear exam reveals no significant lesions or deformities. Canals clear .TMs normal. Hearing is grossly normal bilaterally. Nose: External nasal exam reveals no deformity or inflammation. Nasal mucosa are pink and moist. No lesions or exudates noted.  Mouth: Oral mucosa and oropharynx reveal no lesions or exudates. Teeth in good repair. Neck: No deformities, masses, or tenderness noted. Range of motion normal. Thyroid : physiologic asymmetry. Lungs: Normal respiratory effort; chest expands symmetrically.  Lungs are clear to auscultation without rales, wheezes, or increased work of breathing. Heart: Normal rate and rhythm. Normal S1 and S2. No gallop, click, or rub.S4 w/o  murmur. Abdomen: Bowel sounds normal; abdomen soft and nontender. No masses, organomegaly or hernias noted. Genitalia/DRE: Dr Marcello Fennel Musculoskeletal/extremities: No deformity or scoliosis noted of  the thoracic or lumbar spine. No clubbing, cyanosis, edema, or deformity noted. Range of motion  normal .Tone & strength  normal.Joints normal. Nail health  good. Vascular: Carotid, radial artery, dorsalis pedis and  posterior tibial pulses are full and equal. No bruits present. Neurologic: Alert and oriented x3. Deep tendon reflexes symmetrical and normal.          Skin: Intact without suspicious lesions or rashes. Leaf nevus R lateral abdomen. Blister w/o cellulitis R sole Lymph: No cervical, axillary lymphadenopathy present. Psych: Mood and affect are normal. Normally interactive                                                                                         Assessment & Plan:  #1 comprehensive physical exam; no acute findings #2 see Problem List with Assessments & Recommendations Plan: see Orders

## 2012-02-03 LAB — LIPID PANEL
Cholesterol: 188 mg/dL (ref 0–200)
HDL: 55.7 mg/dL (ref >39.00)
VLDL: 26 mg/dL (ref 0.0–40.0)

## 2012-02-03 LAB — BASIC METABOLIC PANEL
CO2: 29 mEq/L (ref 19–32)
Chloride: 103 mEq/L (ref 96–112)
Glucose, Bld: 97 mg/dL (ref 70–99)
Potassium: 4.1 mEq/L (ref 3.5–5.1)
Sodium: 140 mEq/L (ref 135–145)

## 2012-02-03 LAB — CBC WITH DIFFERENTIAL/PLATELET
Basophils Absolute: 0.1 10*3/uL (ref 0.0–0.1)
Eosinophils Absolute: 0 10*3/uL (ref 0.0–0.7)
HCT: 43.3 % (ref 39.0–52.0)
Hemoglobin: 14.5 g/dL (ref 13.0–17.0)
Lymphs Abs: 1.8 10*3/uL (ref 0.7–4.0)
MCHC: 33.6 g/dL (ref 30.0–36.0)
Neutro Abs: 2.5 10*3/uL (ref 1.4–7.7)
RDW: 12.2 % (ref 11.5–14.6)

## 2012-02-03 LAB — TSH: TSH: 2.74 u[IU]/mL (ref 0.35–5.50)

## 2012-02-03 LAB — HEPATIC FUNCTION PANEL
AST: 66 U/L — ABNORMAL HIGH (ref 0–37)
Albumin: 4.5 g/dL (ref 3.5–5.2)

## 2012-04-23 ENCOUNTER — Other Ambulatory Visit: Payer: Self-pay | Admitting: Internal Medicine

## 2012-05-25 ENCOUNTER — Other Ambulatory Visit: Payer: Self-pay | Admitting: Internal Medicine

## 2012-05-25 NOTE — Telephone Encounter (Signed)
Please advise on labs from 02/02/2012 ( I do not see where they were addressed-system error)

## 2012-05-25 NOTE — Telephone Encounter (Signed)
Refill until 3/14

## 2012-06-04 ENCOUNTER — Telehealth: Payer: Self-pay

## 2012-06-04 DIAGNOSIS — R7401 Elevation of levels of liver transaminase levels: Secondary | ICD-10-CM

## 2012-06-04 NOTE — Telephone Encounter (Signed)
Message copied by Maurice Small on Fri Jun 04, 2012  8:15 AM ------      Message from: Pecola Lawless      Created: Wed May 26, 2012  4:53 PM       Please see the labs 3/13 with handwritten notations . He should have fasting hepatic panel repeated because of the mild elevation of AST/ALT. (code: 790.4)

## 2012-06-04 NOTE — Telephone Encounter (Signed)
I spoke with patient, patient states he received Dr.Hopper's handwritten notes on a mailed copy of labs, patient was currently out of town(vacation)and will return mid-next week. Patient will call next week to set up appointment to recheck liver function test fasting. (Future orders placed).

## 2013-01-01 ENCOUNTER — Other Ambulatory Visit: Payer: Self-pay

## 2013-04-04 ENCOUNTER — Other Ambulatory Visit: Payer: Self-pay | Admitting: General Practice

## 2013-04-04 MED ORDER — PRAVASTATIN SODIUM 20 MG PO TABS: ORAL_TABLET | ORAL | Status: DC

## 2013-08-16 ENCOUNTER — Telehealth: Payer: Self-pay

## 2013-08-16 NOTE — Telephone Encounter (Signed)
LM for CB  HM UTD WE flu vaccine Patient sees urology for hx kidney stones

## 2013-08-17 ENCOUNTER — Encounter: Payer: Self-pay | Admitting: Internal Medicine

## 2013-08-17 ENCOUNTER — Ambulatory Visit (INDEPENDENT_AMBULATORY_CARE_PROVIDER_SITE_OTHER): Payer: BC Managed Care – PPO | Admitting: Internal Medicine

## 2013-08-17 ENCOUNTER — Other Ambulatory Visit: Payer: Self-pay | Admitting: Internal Medicine

## 2013-08-17 VITALS — BP 102/68 | HR 61 | Temp 98.4°F | Ht 72.0 in | Wt 183.6 lb

## 2013-08-17 DIAGNOSIS — Z0000 Encounter for general adult medical examination without abnormal findings: Secondary | ICD-10-CM

## 2013-08-17 DIAGNOSIS — G43009 Migraine without aura, not intractable, without status migrainosus: Secondary | ICD-10-CM

## 2013-08-17 DIAGNOSIS — E785 Hyperlipidemia, unspecified: Secondary | ICD-10-CM

## 2013-08-17 DIAGNOSIS — Z23 Encounter for immunization: Secondary | ICD-10-CM

## 2013-08-17 DIAGNOSIS — Z1331 Encounter for screening for depression: Secondary | ICD-10-CM

## 2013-08-17 MED ORDER — RIZATRIPTAN BENZOATE 10 MG PO TBDP: ORAL_TABLET | ORAL | Status: DC

## 2013-08-17 NOTE — Patient Instructions (Addendum)
EKG is normal but there are MINOR ST-T changes of early repolarization. These are normal variants but could be mistaken for acute injury. This EKG should be available for comparison if  seen emergently.

## 2013-08-17 NOTE — Progress Notes (Signed)
  Subjective:    Patient ID: Aaron Gomez, male    DOB: 1966-08-25, 47 y.o.   MRN: [id number]  HPI  He is here for a physical;acute issues denied.     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 Specifically he denies chest pain, palpitations, dyspnea, or claudication. Family history is strongly positive for premature coronary disease. Advanced cholesterol testing reveals his LDL goal is less than 100, ideally < 70. There is medication compliance with the statin. Significant abdominal symptoms, memory deficit, or myalgias denied.     Objective:   Physical Exam  Gen.: Thin but healthy and well-nourished in appearance. Alert, appropriate and cooperative throughout exam. Appears younger than stated age  Head: Normocephalic without obvious abnormalities  Eyes: No corneal or conjunctival inflammation noted. Pupils equal round reactive to light and accommodation.  Extraocular motion intact. Vision grossly normal without lenses Ears: External  ear exam reveals no significant lesions or deformities. Canals clear .TMs normal. Hearing is grossly normal bilaterally. Nose: External nasal exam reveals no deformity or inflammation. Nasal mucosa are pink and moist. No lesions or exudates noted.   Mouth: Oral mucosa and oropharynx reveal no lesions or exudates. Teeth in good repair. Neck: No deformities, masses, or tenderness noted. Range of motion normal. Thyroid : physiologic asymmetry, R > L.. Lungs: Normal respiratory effort; chest expands symmetrically. Lungs are clear to auscultation without rales, wheezes, or increased work of breathing. Heart: Slow rate and regular rhythm. Normal S1 and S2. No gallop, click, or rub.S4 w/o murmur. Abdomen: Bowel sounds normal; abdomen soft and nontender. No masses, organomegaly or hernias noted. Genitalia: Vasectomy scar tissue on L.DRE as per Dr Marcello Fennel                                  Musculoskeletal/extremities: No deformity or scoliosis noted of  the thoracic or lumbar spine.  No clubbing, cyanosis, edema, or significant extremity  deformity noted. Range of motion normal .Tone & strength  Normal. Joints normal . Nail health good. Able to lie down & sit up w/o help. Negative SLR bilaterally Vascular: Carotid, radial artery, dorsalis pedis and  posterior tibial pulses are full and equal. No bruits present. Neurologic: Alert and oriented x3. Deep tendon reflexes symmetrical and normal.         Skin: Isolated eczema of foot Lymph: No cervical, axillary lymphadenopathy present. Psych: Mood and affect are normal. Normally interactive                                                                                        Assessment & Plan:  #1 comprehensive physical exam; no acute findings  Plan: see Orders  & Recommendations

## 2013-08-18 LAB — CBC WITH DIFFERENTIAL/PLATELET
Basophils Relative: 0.5 % (ref 0.0–3.0)
Eosinophils Absolute: 0.2 10*3/uL (ref 0.0–0.7)
Hemoglobin: 15.8 g/dL (ref 13.0–17.0)
Lymphs Abs: 1.9 10*3/uL (ref 0.7–4.0)
MCHC: 34 g/dL (ref 30.0–36.0)
MCV: 90.4 fl (ref 78.0–100.0)
Monocytes Absolute: 0.7 10*3/uL (ref 0.1–1.0)
Neutro Abs: 4.1 10*3/uL (ref 1.4–7.7)
RBC: 5.15 Mil/uL (ref 4.22–5.81)

## 2013-08-19 ENCOUNTER — Encounter: Payer: Self-pay | Admitting: Internal Medicine

## 2013-08-19 LAB — NMR LIPOPROFILE WITH LIPIDS
HDL Size: 8.2 nm — ABNORMAL LOW (ref >=9.2)
LDL Particle Number: 2099 nmol/L — ABNORMAL HIGH (ref <1000)
LDL Size: 20.1 nm — ABNORMAL LOW (ref >20.5)
Large VLDL-P: 3.2 nmol/L — ABNORMAL HIGH (ref <=2.7)
Small LDL Particle Number: 1442 nmol/L — ABNORMAL HIGH (ref <=527)
VLDL Size: 44.1 nm (ref <=46.6)

## 2013-08-19 LAB — HEPATIC FUNCTION PANEL
ALT: 57 U/L — ABNORMAL HIGH (ref 0–53)
Albumin: 4.6 g/dL (ref 3.5–5.2)
Total Protein: 7.4 g/dL (ref 6.0–8.3)

## 2013-08-19 LAB — BASIC METABOLIC PANEL
CO2: 28 mEq/L (ref 19–32)
Chloride: 103 mEq/L (ref 96–112)
Sodium: 138 mEq/L (ref 135–145)

## 2013-08-19 LAB — TSH: TSH: 2.36 u[IU]/mL (ref 0.35–5.50)

## 2013-08-22 ENCOUNTER — Telehealth: Payer: Self-pay | Admitting: *Deleted

## 2013-08-22 DIAGNOSIS — T887XXA Unspecified adverse effect of drug or medicament, initial encounter: Secondary | ICD-10-CM

## 2013-08-22 DIAGNOSIS — E785 Hyperlipidemia, unspecified: Secondary | ICD-10-CM

## 2013-08-22 MED ORDER — ATORVASTATIN CALCIUM 20 MG PO TABS: 20.0000 mg | ORAL_TABLET | Freq: Every day | ORAL | Status: DC

## 2013-08-22 NOTE — Telephone Encounter (Signed)
Message copied by Verdie Shire on Mon Aug 22, 2013 12:01 PM ------      Message from: Pecola Lawless      Created: Fri Aug 19, 2013  5:04 PM       Change Pravastatin to Atorvastatin 20 mg (#90).      Please  schedule fasting Labs after 10  Weeks of Atorvastatin 20 mg changes: CK,Lipids, hepatic panel. Diagnoses /Codes: 272.4,995.20 ------

## 2013-08-22 NOTE — Telephone Encounter (Signed)
New rx sent to the pharmacy by e-script.  Future labs ordered and sent.//AB/CMA

## 2013-11-23 ENCOUNTER — Other Ambulatory Visit: Payer: Self-pay | Admitting: Internal Medicine

## 2013-11-23 NOTE — Telephone Encounter (Signed)
Atorvastatin refilled per protocol. JG//CMA 

## 2014-06-22 ENCOUNTER — Other Ambulatory Visit: Payer: Self-pay | Admitting: Internal Medicine

## 2014-10-30 ENCOUNTER — Other Ambulatory Visit: Payer: Self-pay

## 2014-10-30 NOTE — Telephone Encounter (Signed)
OK X1 Needs CPX; last 10/14

## 2014-10-31 MED ORDER — RIZATRIPTAN BENZOATE 10 MG PO TBDP: ORAL_TABLET | ORAL | Status: DC

## 2014-12-22 ENCOUNTER — Other Ambulatory Visit: Payer: Self-pay

## 2014-12-22 MED ORDER — ATORVASTATIN CALCIUM 20 MG PO TABS: 20.0000 mg | ORAL_TABLET | Freq: Every day | ORAL | Status: DC

## 2015-02-23 ENCOUNTER — Encounter: Payer: Self-pay | Admitting: Family Medicine

## 2015-02-23 ENCOUNTER — Ambulatory Visit (INDEPENDENT_AMBULATORY_CARE_PROVIDER_SITE_OTHER): Payer: Self-pay | Admitting: Family Medicine

## 2015-02-23 VITALS — BP 110/72 | HR 67 | Temp 98.1°F | Ht 72.0 in | Wt 201.0 lb

## 2015-02-23 DIAGNOSIS — G43009 Migraine without aura, not intractable, without status migrainosus: Secondary | ICD-10-CM

## 2015-02-23 DIAGNOSIS — E785 Hyperlipidemia, unspecified: Secondary | ICD-10-CM

## 2015-02-23 DIAGNOSIS — R0683 Snoring: Secondary | ICD-10-CM | POA: Diagnosis not present

## 2015-02-23 DIAGNOSIS — K219 Gastro-esophageal reflux disease without esophagitis: Secondary | ICD-10-CM | POA: Diagnosis not present

## 2015-02-23 MED ORDER — RIZATRIPTAN BENZOATE 10 MG PO TBDP: 10.0000 mg | ORAL_TABLET | Freq: Every day | ORAL | Status: DC | PRN

## 2015-02-23 MED ORDER — ATORVASTATIN CALCIUM 20 MG PO TABS: 20.0000 mg | ORAL_TABLET | Freq: Every day | ORAL | Status: DC

## 2015-02-23 NOTE — Assessment & Plan Note (Signed)
S: Strong family history of premature myocardial infarction. Last LDL level is elevated but patient was increased to his atorvastatin 20 mg. Patient did run out for the last 2 weeks. Suspect reasonable control while on medication A/P: Suspect good control, continue atorvastatin 20 mg. Patient will follow-up for fasting labs before physical and for 6 months. We will plan on doing an EKG at that time. Discussed the need for exercise and weight loss as his weight is up 18 pounds

## 2015-02-23 NOTE — Progress Notes (Signed)
Aaron Reddish, MD Phone: [phone]  Subjective:  Patient presents today to establish care with me as their new primary care provider. Patient was formerly a patient of Dr. Linna Darner. Chief complaint-noted.   Please see problem-oriented charting ROS-no chest pain, shortness of breath, blurry vision, headaches. Does have some daytime sleepiness.  The following were reviewed and entered/updated in epic: Past Medical History  Diagnosis Date  . Allergy     venom anaphylaxis  . Asthma     extrinsic  . Headache(784.0)   . Nephrolithiasis      X 8,Dr Tannebaum  . Cellulitis     non MR Staph RLE  . GERD (gastroesophageal reflux disease)   . Hyperlipidemia    Patient Active Problem List   Diagnosis Date Noted  . 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 Hyperlipidemia 01/12/2009    Priority: Medium  . Asthma 01/12/2009    Priority: Medium  . Common migraine 01/12/2009    Priority: Medium  . TINEA PEDIS 09/25/2009    Priority: Low  . Allergic rhinitis 01/12/2009    Priority: Low  . GERD 01/12/2009    Priority: Low  . NEPHROLITHIASIS, HX OF 01/12/2009    Priority: Low  . Snoring 02/23/2015   Past Surgical History  Procedure Laterality Date  . Knee arthroscopy  2011    R knee; Dr Maureen Ralphs  . Microdiscectomy lumbar  1999    L4-5,L5-S1;Dr. Trenton Gammon  . Tonsillectomy    . Lumbar fusion  2004    Dr Trenton Gammon  . Elbow surgery      Tommy Aquil's    Family History  Problem Relation Age of Onset  . Thyroid disease Mother   . Heart attack Father 58    died @ 21  . Heart attack Brother 71    1/2 brother  . Crohn's disease Brother   . Hypertension Paternal Grandmother   . Heart disease Paternal Grandmother     MI in 40s  . Diabetes Paternal Grandmother   . Heart attack Paternal Grandfather 41  . Heart attack Paternal Aunt 33    Medications- reviewed and updated Current Outpatient Prescriptions  Medication Sig Dispense Refill  . aspirin 81 MG tablet Take 81 mg by mouth  daily.      [name] atorvastatin (LIPITOR) 20 MG tablet Take 1 tablet (20 mg total) by mouth daily. 90 tablet 3  . fexofenadine (ALLEGRA) 180 MG tablet Take 180 mg by mouth daily.      [name] ibuprofen (ADVIL,MOTRIN) 800 MG tablet Take 800 mg by mouth every 8 (eight) hours as needed.      [name] Ketorolac Tromethamine 15.75 MG/SPRAY SOLN Place 1 spray into the nose every 4 (four) hours as needed.    . ranitidine (ZANTAC) 150 MG capsule Take 150 mg by mouth 2 (two) times daily.      . rizatriptan (MAXALT-MLT) 10 MG disintegrating tablet Take 1 tablet (10 mg total) by mouth daily as needed for migraine. DISSOLVE ONE TABLET IN MOUTH AS NEEDED 12 tablet 5  . Tamsulosin HCl (FLOMAX) 0.4 MG CAPS Take 0.4 mg by mouth as needed.     [name] albuterol (PROAIR HFA) 108 (90 BASE) MCG/ACT inhaler Inhale 2 puffs into the lungs every 6 (six) hours as needed.      [name] EPINEPHrine (EPIPEN) 0.3 mg/0.3 mL DEVI Inject 0.3 mg into the muscle once.      . mometasone (NASONEX) 50 MCG/ACT nasal spray Place 2 sprays into the nose daily.  Allergies-reviewed and updated Allergies  Allergen Reactions  . Bee Venom     Also venom from wasps & hornets cause anaphylaxis; he carries Epi-Pen & has Medi Alert bracelet  . Neomycin Sulfate     Anaphylactic shock  Because of a history of documented adverse serious drug reaction;Medi Alert bracelet  is recommended    History   Social History  . Marital Status: Married    Spouse Name: N/A  . Number of Children: N/A  . Years of Education: N/A   Social History Main Topics  . Smoking status: Former Smoker    Quit date: 11/17/1986  . Smokeless tobacco: Not on file     Comment: smoked 1980-1988, up to 1 ppd  . Alcohol Use: Yes     Comment: minimally  . Drug Use: No  . Sexual Activity: Not on file   Other Topics Concern  . None   Social History Narrative   Married. 2 kids-son and daughter.       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  lot of outdoor)    ROS--See HPI   Objective: BP 110/72 mmHg  Pulse 67  Temp(Src) 98.1 F (36.7 C)  Ht 6' (1.829 m)  Wt 201 lb (91.173 kg)  BMI 27.25 kg/m2 Gen: NAD, resting comfortably HEENT: Mucous membranes are moist. Oropharynx normal CV: RRR no murmurs rubs or gallops Lungs: CTAB no crackles, wheeze, rhonchi Abdomen: soft/nontender/nondistended/normal bowel sounds. Overweight Ext: no edema Skin: warm, dry, no rash  Neuro: grossly normal, moves all extremities, PERRLA  Assessment/Plan:  Hyperlipidemia S: Strong family history of premature myocardial infarction. Last LDL level is elevated but patient was increased to his atorvastatin 20 mg. Patient did run out for the last 2 weeks. Suspect reasonable control while on medication A/P: Suspect good control, continue atorvastatin 20 mg. Patient will follow-up for fasting labs before physical and for 6 months. We will plan on doing an EKG at that time. Discussed the need for exercise and weight loss as his weight is up 18 pounds    Common migraine S: Long-term history of headaches. Average 1 a month, but come in clusters 2-3 every 2-3 months.  A/P: Continue without prophylaxis. Refill Maxalt. Patient can also use ibuprofen or Excedrin as other options.    GERD S: Reasonable control on Zantac once daily. He notices epigastric discomfort and some retrosternal burning when he stops the medication A/P: Continue Zantac daily. at some point if he continues to gain weight that I suspect he will need twice-daily dosing- hopeful to reverse this trend.    Snoring S: As patient has gained 18 pounds over last 6 months he also had an increase in his snoring. He doesn't seem as well rested in the daytime. A/P: We discussed losing weight. If snoring difficulties do not improve then we would consider sleep evaluation by pulmonology.    four to six-month follow-up for annual physical. Labs week prior.   Meds ordered this encounter    Medications  . atorvastatin (LIPITOR) 20 MG tablet    Sig: Take 1 tablet (20 mg total) by mouth daily.    Dispense:  90 tablet    Refill:  3  . rizatriptan (MAXALT-MLT) 10 MG disintegrating tablet    Sig: Take 1 tablet (10 mg total) by mouth daily as needed for migraine. DISSOLVE ONE TABLET IN MOUTH AS NEEDED    Dispense:  12 tablet    Refill:  5    

## 2015-02-23 NOTE — Assessment & Plan Note (Signed)
S: Reasonable control on Zantac once daily. He notices epigastric discomfort and some retrosternal burning when he stops the medication A/P: Continue Zantac daily. at some point if he continues to gain weight that I suspect he will need twice-daily dosing- hopeful to reverse this trend.

## 2015-02-23 NOTE — Patient Instructions (Addendum)
Most important thing from today is to reverse the weight trend -Encourage walking as much as possible on days that you are not bothered -think losing weight will help sleep issues, consider sleep eval if does not -love the idea of cutting portions, dash diet below is a healthy eating option for blood pressure/heart disease  Let's see each other in 4-6 months with fasting bloodwork a few days before  Refilled atorvastatin and maxalt   DASH Eating Plan DASH stands for "Dietary Approaches to Stop Hypertension." The DASH eating plan is a healthy eating plan that has been shown to reduce high blood pressure (hypertension). Additional health benefits may include reducing the risk of type 2 diabetes mellitus, heart disease, and stroke. The DASH eating plan may also help with weight loss. WHAT DO I NEED TO KNOW ABOUT THE DASH EATING PLAN? For the DASH eating plan, you will follow these general guidelines:  Choose foods with a percent daily value for sodium of less than 5% (as listed on the food label).  Use salt-free seasonings or herbs instead of table salt or sea salt.  Check with your health care provider or pharmacist before using salt substitutes.  Eat lower-sodium products, often labeled as "lower sodium" or "no salt added."  Eat fresh foods.  Eat more vegetables, fruits, and low-fat dairy products.  Choose whole grains. Look for the word "whole" as the first word in the ingredient list.  Choose fish and skinless chicken or [place] more often than red meat. Limit fish, poultry, and meat to 6 oz (170 g) each day.  Limit sweets, desserts, sugars, and sugary drinks.  Choose heart-healthy fats.  Limit cheese to 1 oz (28 g) per day.  Eat more home-cooked food and less restaurant, buffet, and fast food.  Limit fried foods.  Cook foods using methods other than frying.  Limit canned vegetables. If you do use them, rinse them well to decrease the sodium.  When eating at a restaurant,  ask that your food be prepared with less salt, or no salt if possible. WHAT FOODS CAN I EAT? Seek help from a dietitian for individual calorie needs. Grains Whole grain or whole wheat bread. Brown rice. Whole grain or whole wheat pasta. Quinoa, bulgur, and whole grain cereals. Low-sodium cereals. Corn or whole wheat flour tortillas. Whole grain cornbread. Whole grain crackers. Low-sodium crackers. Vegetables Fresh or frozen vegetables (raw, steamed, roasted, or grilled). Low-sodium or reduced-sodium tomato and vegetable juices. Low-sodium or reduced-sodium tomato sauce and paste. Low-sodium or reduced-sodium canned vegetables.  Fruits All fresh, canned (in natural juice), or frozen fruits. Meat and Other Protein Products Ground beef (85% or leaner), grass-fed beef, or beef trimmed of fat. Skinless chicken or [place]. Ground chicken or [place]. Pork trimmed of fat. All fish and seafood. Eggs. Dried beans, peas, or lentils. Unsalted nuts and seeds. Unsalted canned beans. Dairy Low-fat dairy products, such as skim or 1% milk, 2% or reduced-fat cheeses, low-fat ricotta or cottage cheese, or plain low-fat yogurt. Low-sodium or reduced-sodium cheeses. Fats and Oils Tub margarines without trans fats. Light or reduced-fat mayonnaise and salad dressings (reduced sodium). Avocado. Safflower, olive, or canola oils. Natural peanut or almond butter. Other Unsalted popcorn and pretzels. The items listed above may not be a complete list of recommended foods or beverages. Contact your dietitian for more options. WHAT FOODS ARE NOT RECOMMENDED? Grains White bread. White pasta. White rice. Refined cornbread. Bagels and croissants. Crackers that contain trans fat. Vegetables Creamed or fried vegetables. Vegetables in  a cheese sauce. Regular canned vegetables. Regular canned tomato sauce and paste. Regular tomato and vegetable juices. Fruits Dried fruits. Canned fruit in light or heavy syrup. Fruit juice. Meat  and Other Protein Products Fatty cuts of meat. Ribs, chicken wings, bacon, sausage, bologna, salami, chitterlings, fatback, hot dogs, bratwurst, and packaged luncheon meats. Salted nuts and seeds. Canned beans with salt. Dairy Whole or 2% milk, cream, half-and-half, and cream cheese. Whole-fat or sweetened yogurt. Full-fat cheeses or blue cheese. Nondairy creamers and whipped toppings. Processed cheese, cheese spreads, or cheese curds. Condiments Onion and garlic salt, seasoned salt, table salt, and sea salt. Canned and packaged gravies. Worcestershire sauce. Tartar sauce. Barbecue sauce. Teriyaki sauce. Soy sauce, including reduced sodium. Steak sauce. Fish sauce. Oyster sauce. Cocktail sauce. Horseradish. Ketchup and mustard. Meat flavorings and tenderizers. Bouillon cubes. Hot sauce. Tabasco sauce. Marinades. Taco seasonings. Relishes. Fats and Oils Butter, stick margarine, lard, shortening, ghee, and bacon fat. Coconut, palm kernel, or palm oils. Regular salad dressings. Other Pickles and olives. Salted popcorn and pretzels. The items listed above may not be a complete list of foods and beverages to avoid. Contact your dietitian for more information. WHERE CAN I FIND MORE INFORMATION? National Heart, Lung, and Blood Institute: travelstabloid.com Document Released: 10/23/2011 Document Revised: 03/20/2014 Document Reviewed: 09/07/2013 [facility] Patient Information 2015 [place], [place]. This information is not intended to replace advice given to you by your health care provider. Make sure you discuss any questions you have with your health care provider.

## 2015-02-23 NOTE — Assessment & Plan Note (Signed)
S: As patient has gained 18 pounds over last 6 months he also had an increase in his snoring. He doesn't seem as well rested in the daytime. A/P: We discussed losing weight. If snoring difficulties do not improve then we would consider sleep evaluation by pulmonology.

## 2015-02-23 NOTE — Assessment & Plan Note (Signed)
S: Long-term history of headaches. Average 1 a month, but come in clusters 2-3 every 2-3 months.  A/P: Continue without prophylaxis. Refill Maxalt. Patient can also use ibuprofen or Excedrin as other options.

## 2015-06-26 ENCOUNTER — Telehealth: Payer: Self-pay

## 2015-06-26 DIAGNOSIS — Z0000 Encounter for general adult medical examination without abnormal findings: Secondary | ICD-10-CM

## 2015-06-26 NOTE — Telephone Encounter (Signed)
Labs entered.

## 2015-06-27 ENCOUNTER — Other Ambulatory Visit (INDEPENDENT_AMBULATORY_CARE_PROVIDER_SITE_OTHER): Payer: BLUE CROSS/BLUE SHIELD

## 2015-06-27 ENCOUNTER — Other Ambulatory Visit: Payer: BLUE CROSS/BLUE SHIELD

## 2015-06-27 DIAGNOSIS — E039 Hypothyroidism, unspecified: Secondary | ICD-10-CM

## 2015-06-27 DIAGNOSIS — I519 Heart disease, unspecified: Principal | ICD-10-CM

## 2015-06-27 DIAGNOSIS — Z0000 Encounter for general adult medical examination without abnormal findings: Secondary | ICD-10-CM | POA: Diagnosis not present

## 2015-06-27 LAB — BASIC METABOLIC PANEL
BUN: 14 mg/dL (ref 6–23)
CHLORIDE: 105 meq/L (ref 96–112)
CO2: 27 mEq/L (ref 19–32)
CREATININE: 1.05 mg/dL (ref 0.40–1.50)
Calcium: 9.3 mg/dL (ref 8.4–10.5)
GFR: 79.82 mL/min (ref >60.00)
Glucose, Bld: 96 mg/dL (ref 70–99)
Potassium: 3.9 mEq/L (ref 3.5–5.1)
SODIUM: 140 meq/L (ref 135–145)

## 2015-06-27 LAB — LIPID PANEL
CHOLESTEROL: 118 mg/dL (ref 0–200)
HDL: 41.9 mg/dL (ref >39.00)
LDL Cholesterol: 55 mg/dL (ref 0–99)
NonHDL: 76.05
TRIGLYCERIDES: 103 mg/dL (ref 0.0–149.0)
Total CHOL/HDL Ratio: 3
VLDL: 20.6 mg/dL (ref 0.0–40.0)

## 2015-06-27 LAB — CBC
HEMATOCRIT: 41.2 % (ref 39.0–52.0)
Hemoglobin: 13.9 g/dL (ref 13.0–17.0)
MCHC: 33.8 g/dL (ref 30.0–36.0)
MCV: 91.2 fl (ref 78.0–100.0)
Platelets: 278 10*3/uL (ref 150.0–400.0)
RBC: 4.52 Mil/uL (ref 4.22–5.81)
RDW: 13.1 % (ref 11.5–15.5)
WBC: 5.6 10*3/uL (ref 4.0–10.5)

## 2015-06-27 LAB — TSH: TSH: 11.78 u[IU]/mL — AB (ref 0.35–4.50)

## 2015-06-27 LAB — HEPATIC FUNCTION PANEL
ALK PHOS: 55 U/L (ref 39–117)
ALT: 30 U/L (ref 0–53)
AST: 19 U/L (ref 0–37)
Albumin: 4.3 g/dL (ref 3.5–5.2)
BILIRUBIN DIRECT: 0.1 mg/dL (ref 0.0–0.3)
TOTAL PROTEIN: 6.9 g/dL (ref 6.0–8.3)
Total Bilirubin: 0.6 mg/dL (ref 0.2–1.2)

## 2015-06-29 ENCOUNTER — Ambulatory Visit: Payer: BLUE CROSS/BLUE SHIELD | Admitting: Family Medicine

## 2015-06-29 LAB — T4: T4, Total: 5.7 ug/dL (ref 4.5–12.0)

## 2015-06-29 LAB — T3: T3 TOTAL: 102.9 ng/dL (ref 80.0–204.0)

## 2015-07-04 ENCOUNTER — Other Ambulatory Visit (INDEPENDENT_AMBULATORY_CARE_PROVIDER_SITE_OTHER): Payer: BLUE CROSS/BLUE SHIELD

## 2015-07-04 DIAGNOSIS — E039 Hypothyroidism, unspecified: Secondary | ICD-10-CM

## 2015-07-04 DIAGNOSIS — I519 Heart disease, unspecified: Principal | ICD-10-CM

## 2015-07-04 LAB — T3, FREE: T3 FREE: 3.4 pg/mL (ref 2.3–4.2)

## 2015-07-04 LAB — T4, FREE: Free T4: 0.57 ng/dL — ABNORMAL LOW (ref 0.60–1.60)

## 2015-07-06 ENCOUNTER — Ambulatory Visit (INDEPENDENT_AMBULATORY_CARE_PROVIDER_SITE_OTHER): Payer: BLUE CROSS/BLUE SHIELD | Admitting: Family Medicine

## 2015-07-06 ENCOUNTER — Encounter: Payer: Self-pay | Admitting: Family Medicine

## 2015-07-06 VITALS — BP 122/70 | HR 66 | Temp 98.8°F | Wt 195.0 lb

## 2015-07-06 DIAGNOSIS — E039 Hypothyroidism, unspecified: Secondary | ICD-10-CM | POA: Insufficient documentation

## 2015-07-06 DIAGNOSIS — E785 Hyperlipidemia, unspecified: Secondary | ICD-10-CM

## 2015-07-06 DIAGNOSIS — Z0000 Encounter for general adult medical examination without abnormal findings: Secondary | ICD-10-CM | POA: Diagnosis not present

## 2015-07-06 MED ORDER — LEVOTHYROXINE SODIUM 75 MCG PO TABS: 75.0000 ug | ORAL_TABLET | Freq: Every day | ORAL | Status: DC

## 2015-07-06 NOTE — Progress Notes (Signed)
Aaron Reddish, MD Phone: [phone]  Subjective:  Patient presents today for their annual physical. Chief complaint-noted.   Chol looks great with starting atorvastatin back Wt down 6 lbs. To help with gerd. Snoring Family history of early MI- get EKG  ROS- full  review of systems was completed and negative  Pertinent negn- o hair or nail changes. No heat/cold intolerance. No constipation or diarrhea. Denies shakiness or anxiety.No palpitations. No chest pain or shortness of breath, no myalgias   The following were reviewed and entered/updated in epic: Past Medical History  Diagnosis Date  . Allergy     venom anaphylaxis  . Asthma     extrinsic  . Headache(784.0)   . Nephrolithiasis      X 8,Dr Tannebaum  . Cellulitis     non MR Staph RLE  . GERD (gastroesophageal reflux disease)   . Hyperlipidemia    Patient Active Problem List   Diagnosis Date Noted  . Hypothyroidism 07/06/2015    Priority: Medium  . 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 Hyperlipidemia 01/12/2009    Priority: Medium  . Asthma 01/12/2009    Priority: Medium  . Common migraine 01/12/2009    Priority: Medium  . TINEA PEDIS 09/25/2009    Priority: Low  . Allergic rhinitis 01/12/2009    Priority: Low  . GERD 01/12/2009    Priority: Low  . NEPHROLITHIASIS, HX OF 01/12/2009    Priority: Low  . Snoring 02/23/2015   Past Surgical History  Procedure Laterality Date  . Knee arthroscopy  2011    R knee; Dr Maureen Ralphs  . Microdiscectomy lumbar  1999    L4-5,L5-S1;Dr. Trenton Gammon  . Tonsillectomy    . Lumbar fusion  2004    Dr Trenton Gammon  . Elbow surgery      Tommy Glennon's    Family History  Problem Relation Age of Onset  . Thyroid disease Mother   . Heart attack Father 28    died @ 27  . Heart attack Brother 25    1/2 brother  . Crohn's disease Brother   . Hypertension Paternal Grandmother   . Heart disease Paternal Grandmother     MI in 66s  . Diabetes Paternal Grandmother   . Heart  attack Paternal Grandfather 67  . Heart attack Paternal Aunt 48    Medications- reviewed and updated Current Outpatient Prescriptions  Medication Sig Dispense Refill  . aspirin 81 MG tablet Take 81 mg by mouth daily.      [name] atorvastatin (LIPITOR) 20 MG tablet Take 1 tablet (20 mg total) by mouth daily. 90 tablet 3  . fexofenadine (ALLEGRA) 180 MG tablet Take 180 mg by mouth daily.      [name] ibuprofen (ADVIL,MOTRIN) 800 MG tablet Take 800 mg by mouth every 8 (eight) hours as needed.      [name] Ketorolac Tromethamine 15.75 MG/SPRAY SOLN Place 1 spray into the nose every 4 (four) hours as needed.    . mometasone (NASONEX) 50 MCG/ACT nasal spray Place 2 sprays into the nose daily.      . ranitidine (ZANTAC) 150 MG capsule Take 150 mg by mouth 2 (two) times daily.      [name] albuterol (PROAIR HFA) 108 (90 BASE) MCG/ACT inhaler Inhale 2 puffs into the lungs every 6 (six) hours as needed.      [name] EPINEPHrine (EPIPEN) 0.3 mg/0.3 mL DEVI Inject 0.3 mg into the muscle once.      [name] levothyroxine (SYNTHROID, LEVOTHROID) 75 MCG  tablet Take 1 tablet (75 mcg total) by mouth daily. 90 tablet 3  . rizatriptan (MAXALT-MLT) 10 MG disintegrating tablet Take 1 tablet (10 mg total) by mouth daily as needed for migraine. DISSOLVE ONE TABLET IN MOUTH AS NEEDED (Patient not taking: Reported on 07/06/2015) 12 tablet 5  . Tamsulosin HCl (FLOMAX) 0.4 MG CAPS Take 0.4 mg by mouth as needed.      No current facility-administered medications for this visit.    Allergies-reviewed and updated Allergies  Allergen Reactions  . Bee Venom     Also venom from wasps & hornets cause anaphylaxis; he carries Epi-Pen & has Medi Alert bracelet  . Neomycin Sulfate     Anaphylactic shock  Because of a history of documented adverse serious drug reaction;Medi Alert bracelet  is recommended    Social History   Social History  . Marital Status: Married    Spouse Name: N/A  . Number of Children: N/A  . Years of Education: N/A   Social  History Main Topics  . Smoking status: Former Smoker    Quit date: 11/17/1986  . Smokeless tobacco: None     Comment: smoked 1980-1988, up to 1 ppd  . Alcohol Use: Yes     Comment: minimally  . Drug Use: No  . Sexual Activity: Not Asked   Other Topics Concern  . None   Social History Narrative   Married. 2 kids-son and daughter.       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 lot of outdoor)    ROS--See HPI   Objective: BP 122/70 mmHg  Pulse 66  Temp(Src) 98.8 F (37.1 C)  Wt 195 lb (88.451 kg) Gen: NAD, resting comfortably HEENT: Mucous membranes are moist. Oropharynx normal Neck: mild thyromegaly CV: RRR no murmurs rubs or gallops Lungs: CTAB no crackles, wheeze, rhonchi Abdomen: soft/nontender/nondistended/normal bowel sounds. No rebound or guarding.  Rectal at urology  Ext: no edema, 2+ PT and DP pulses Skin: warm, dry, no rash. No signs BCC, SCC, melanoma. 1 small 2-3 mm lesion on left abdomen dark, raised- no recent change to watch Neuro: grossly normal, moves all extremities, PERRLA  EKG: sinus bradycardia, normal axis, normal intervals, no hypertrophy, no st or t wave changes   Assessment/Plan:  49 y.o. male presenting for annual physical.  Health Maintenance counseling: 1. Anticipatory guidance: Patient counseled regarding regular dental exams, wearing seatbelts, wearing sunscreen 2. Risk factor reduction:  Advised patient of need for regular exercise (some issues with knees- got ellipitical) and diet rich and fruits and vegetables to reduce risk of heart attack and stroke. Working on portion [employer] and has lost 6 lbs. Not eating out in AM, still eating out at lunch, home cooking for dinner 3. Immunizations/screenings/ancillary studies Health Maintenance Due  Topic Date Due  . HIV Screening - through donating blood 07/30/1981  . INFLUENZA VACCINE - gets at work 06/18/2015  4. Skin cancer screening-  normal skin exam 5. Prostate cancer screening- gets rectal exams with urology for nephrolithiasis 6. Colon cancer screening at 50  Hyperlipidemia S: controlled when on atorvastatin 20mg  Lab Results  Component Value Date   CHOL 118 06/27/2015   HDL 41.90 06/27/2015   LDLCALC 55 06/27/2015   LDLDIRECT 149.4 03/18/2010   TRIG 103.0 06/27/2015   CHOLHDL 3 06/27/2015  A/P:Continue current meds   Hypothyroidism S: New issues as of most recent set of labs with TSH near 12 and  free T4 mildly depressed A/P: start thyroid supplementation at 75 mcg,   6 week TSH, likely 6 month f/u. Sooner Return precautions advised.   Orders Placed This Encounter  Procedures  . TSH    [place]    Standing Status: Future     Number of Occurrences:      Standing Expiration Date: 07/05/2016  . EKG 12-Lead    Meds ordered this encounter  Medications  . levothyroxine (SYNTHROID, LEVOTHROID) 75 MCG tablet    Sig: Take 1 tablet (75 mcg total) by mouth daily.    Dispense:  90 tablet    Refill:  3

## 2015-07-06 NOTE — Assessment & Plan Note (Signed)
S: New issues as of most recent set of labs with TSH near 12 and free T4 mildly depressed A/P: start thyroid supplementation at 75 mcg

## 2015-07-06 NOTE — Assessment & Plan Note (Signed)
S: controlled when on atorvastatin 20mg  Lab Results  Component Value Date   CHOL 118 06/27/2015   HDL 41.90 06/27/2015   LDLCALC 55 06/27/2015   LDLDIRECT 149.4 03/18/2010   TRIG 103.0 06/27/2015   CHOLHDL 3 06/27/2015  A/P:Continue current meds

## 2015-07-06 NOTE — Patient Instructions (Signed)
Medication Instructions:  Add levothyroxine 75 mcg, no other changes  Labwork: Return for labs 6 weeks after you start new medicine, so probably 7 weeks.   Testing/Procedures/Immunizations: Flu shot sept- november  Follow-Up (all visit scheduling, rescheduling, cancellations including labs should be scheduled at front desk): 6 months, sooner if you need [place]

## 2015-08-13 ENCOUNTER — Other Ambulatory Visit: Payer: Self-pay | Admitting: Urology

## 2015-08-15 ENCOUNTER — Other Ambulatory Visit: Payer: Self-pay | Admitting: Urology

## 2015-08-15 ENCOUNTER — Encounter (HOSPITAL_COMMUNITY): Payer: Self-pay | Admitting: *Deleted

## 2015-08-15 NOTE — Progress Notes (Signed)
Call to [facility] to clarify who will be doing procedure and she states Dr. Jeffie Pollock will do procedure on 10/3/16she will correct order.

## 2015-08-19 NOTE — H&P (Signed)
Reason For Visit 1 week f/u, KUB & review 24hr urine results   Active Problems Problems  1. Nephrolithiasis (N20.0)   Assessed By: Carolan Clines (Urology); Last Assessed: 01 Jun 2015  History of Present Illness     Aaron Gomez is an 49 yo married male, returning today for a 1 week f/u, KUB & to review 24hr urine results. He was last seen by Jimmey Ralph, NP on 07/23/13 for right flank pain. Intermittent right flank pain with nausea since 07/16/15. Pain and nausea controlled with PRN Sprix and Ondansetron. KUB showed ? Rt ureteral stone at L3-4. Review of KUB and CT scan shows 4.5 mm stone in the RUP kidney.     Hx of kidney stones. He has recently passed a kidney stone this past weekend from Left side; & 3 in May 2016 from Right side. He hs the 74m stone from may (R) and 580mfrom Left side with him today. He has Sprix at home, which worked great. Multiple bilateral stones. + family hx stones (father). Sodas: none x 2 years. Coffee, and water. Stone analysis shows ca oxalate stone.     Stone analysis: July 2016 calcium oxalate   Past Medical History Problems  1. History of Asthma (J45.909) 2. History of esophageal reflux (Z87.19)  Surgical History Problems  1. History of Arthroscopy Knee Right 2. History of Elbow Surgery 3. History of Laminectomy Lumbar 4. History of Laminectomy Lumbar 5. History of Oral Surgery Tooth Extraction 6. History of Surgery Of Male Genitalia Vasectomy  Current Meds 1. Allegra 180 MG TABS;  Therapy: (ReIWLNLGXQ:[id number] Recorded 2. Aspirin 81 MG TABS;  Therapy: (ReXKGYJEHU:[id number] Recorded 3. Atorvastatin Calcium 20 MG Oral Tablet;  Therapy: (Recorded:15Jul2016) to Recorded 4. Cyclobenzaprine HCl - 10 MG Oral Tablet; TAKE 1 TABLET 3 TIMES DAILY AS NEEDED;  Therapy: [id number] (Evaluate:11Sep2016)  Requested for: 06Sep2016; Last  Rx:06Sep2016 Ordered 5. EPINEPHrine SOLN;  Therapy: (Recorded:15Jul2016) to Recorded 6. Ibuprofen 600 MG Oral  Tablet; TAKE TABLET  PRN;  Therapy: [id number] (Evaluate:19Dec2012) Recorded 7. Levothyroxine Sodium TABS;  Therapy: (Recorded:06Sep2016) to Recorded 8. Maxalt-MLT 10 MG Oral Tablet Dispersible;  Therapy: (Recorded:29Apr2015) to Recorded 9. Ondansetron HCl - 8 MG Oral Tablet; TAKE 1 TABLET 4 times daily PRN nausea   Requested for: [id number] Rx:15Jul2016 Ordered 10. Oxycodone-Acetaminophen 10-325 MG Oral Tablet; TAKE 1 TABLET EVERY 4 TO 6   HOURS AS NEEDED FOR PAIN;   Therapy: [id number] (Evaluate:19Jul2016); Last Rx:15Jul2016 Ordered 11. Sprix 15.75 MG/SPRAY Nasal Solution; INSTILL 1 SQUIRT Every 8 hours  Requested for:   [id number] Rx:15Jul2016 Ordered 12. Tamsulosin HCl - 0.4 MG Oral Capsule; TAKE 1 CAPSULE Bedtime;   Therapy: 23Jan2012 to (Evaluate:10Jul2017)  Requested for: [id number]   Rx:15Jul2016 Ordered 13. Zantac 150 MG CAPS;   Therapy: (ReTZGYFVCB:[id number] Recorded 14. Zyrtec TABS;   Therapy: (Recorded:29Apr2015) to Recorded  Allergies Medication  1. Neomycin Sulfate TABS Non-Medication  2. Bee sting  Family History Problems  1. Family history of Acute Myocardial Infarction : Father 2. Family history of Family Health Status - Mother's Age 46.74Family history of Family Health Status Number Of Children   1 son, 1 daughter 4. Family history of Father Deceased At Age ____   5389heart attack 5. Family history of Heart Disease 6. Family history of Nephrolithiasis : Father  Social History Problems  1. Being A Social Drinker 2. Caffeine Use   2 per day 3. Former smoker (Z87.891)   1 ppd x [id number]  67yr ago 4. Marital History - Currently Married 5. Occupation:   PEngineer, structural Review of Systems Genitourinary, constitutional, skin, eye, otolaryngeal, hematologic/lymphatic, cardiovascular, pulmonary, endocrine, musculoskeletal, gastrointestinal, neurological and psychiatric system(s) were reviewed and pertinent findings if present are  noted and are otherwise negative.  Gastrointestinal: nausea and flank pain.    Vitals Vital Signs [Data Includes: Last 1 Day]  Recorded: 14Sep2016 02:45PM  Blood Pressure: 120 / 76 Temperature: 97.9 F Heart Rate: 66  Physical Exam Constitutional: Well nourished and well developed . No acute distress.  ENT:. The ears and nose are normal in appearance.  Neck: The appearance of the neck is normal and no neck mass is present.  Pulmonary: No respiratory distress and normal respiratory rhythm and effort.  Cardiovascular: Heart rate and rhythm are normal . No peripheral edema.  Abdomen: The abdomen is soft and nontender. No masses are palpated. mild right CVA tenderness no CVA tenderness. No hernias are palpable. No hepatosplenomegaly noted.  Genitourinary: Examination of the penis demonstrates no discharge, no masses, no lesions and a normal meatus. The scrotum is without lesions. The right epididymis is palpably normal and non-tender. The left epididymis is palpably normal and non-tender. The right testis is non-tender and without masses. The left testis is non-tender and without masses.  Lymphatics: The femoral and inguinal nodes are not enlarged or tender.  Skin: Normal skin turgor, no visible rash and no visible skin lesions.  Neuro/Psych:. Mood and affect are appropriate.    Results/Data Urine [Data Includes: Last 1 Day]   14Sep2016  COLOR YELLOW   APPEARANCE CLEAR   SPECIFIC GRAVITY 1.015   pH 6.0   GLUCOSE NEGATIVE   BILIRUBIN NEGATIVE   KETONE NEGATIVE   BLOOD 1+   PROTEIN NEGATIVE   NITRITE NEGATIVE   LEUKOCYTE ESTERASE NEGATIVE   SQUAMOUS EPITHELIAL/HPF NONE SEEN HPF  WBC NONE SEEN WBC/HPF  RBC 0-2 RBC/HPF  BACTERIA NONE SEEN HPF  CRYSTALS NONE SEEN HPF  CASTS NONE SEEN LPF  Yeast NONE SEEN HPF   Assessment Assessed  1. Kidney stone on right side (N20.0) 2. Right flank pain (R10.9)  JVolneyis very symptomatic today, with severe right flank pain. he is off his sprix,  in hopes of having lithotripsy. However, I cannot see the stone on the KUB, and believe hs needs another CT scan, with ora and IV contrast, in order to be sure we have not missed anyl other pathology. He has only right flank pain: no RUQ pain, No SOB, No N, V. No gross hematuria, No hx of ulcers, or pancreatitis, or evidence of appendicitis.   Plan Health Maintenance  1. UA With REFLEX; [Do Not Release]; Status:Resulted - Requires Verification;   Done:  [id number]:12PM Kidney stone on right side  2. Administer: Ketorolac Tromethamine 60 MG/2ML Injection Solution; INJECT 60  MG  Intramuscular; To Be Done: 14Sep2016 3. CBC W/DIFF; Status:Hold For - Specimen/Data Collection,Appointment; Requested  for:14Sep2016;  4. CREATININE with eGFR; Status:Hold For - Specimen/Data Collection,Appointment;  Requested for:14Sep2016;   CBC, Cr/ gfr, and CT with oral and IV contrast.   Toradol now.

## 2015-08-20 ENCOUNTER — Encounter (HOSPITAL_COMMUNITY)
Admission: RE | Disposition: A | Discharge status: Home / Self Care | Payer: Self-pay | Source: Ambulatory Visit | Attending: Urology

## 2015-08-20 ENCOUNTER — Ambulatory Visit (HOSPITAL_COMMUNITY): Payer: BLUE CROSS/BLUE SHIELD

## 2015-08-20 ENCOUNTER — Encounter (HOSPITAL_COMMUNITY): Payer: Self-pay | Admitting: General Practice

## 2015-08-20 ENCOUNTER — Ambulatory Visit (HOSPITAL_COMMUNITY)
Admission: RE | Admit: 2015-08-20 | Discharge: 2015-08-20 | Disposition: A | Discharge status: Home / Self Care | Payer: BLUE CROSS/BLUE SHIELD | Source: Ambulatory Visit | Attending: Urology | Admitting: Urology

## 2015-08-20 DIAGNOSIS — J45909 Unspecified asthma, uncomplicated: Secondary | ICD-10-CM | POA: Insufficient documentation

## 2015-08-20 DIAGNOSIS — Z87891 Personal history of nicotine dependence: Secondary | ICD-10-CM | POA: Diagnosis not present

## 2015-08-20 DIAGNOSIS — N200 Calculus of kidney: Secondary | ICD-10-CM | POA: Diagnosis present

## 2015-08-20 DIAGNOSIS — Z79899 Other long term (current) drug therapy: Secondary | ICD-10-CM | POA: Insufficient documentation

## 2015-08-20 DIAGNOSIS — Z791 Long term (current) use of non-steroidal anti-inflammatories (NSAID): Secondary | ICD-10-CM | POA: Insufficient documentation

## 2015-08-20 DIAGNOSIS — Z87442 Personal history of urinary calculi: Secondary | ICD-10-CM | POA: Diagnosis not present

## 2015-08-20 DIAGNOSIS — Z79891 Long term (current) use of opiate analgesic: Secondary | ICD-10-CM | POA: Insufficient documentation

## 2015-08-20 DIAGNOSIS — K219 Gastro-esophageal reflux disease without esophagitis: Secondary | ICD-10-CM | POA: Diagnosis not present

## 2015-08-20 DIAGNOSIS — Z7982 Long term (current) use of aspirin: Secondary | ICD-10-CM | POA: Diagnosis not present

## 2015-08-20 HISTORY — DX: Hypothyroidism, unspecified: E03.9

## 2015-08-20 SURGERY — LITHOTRIPSY, ESWL
Anesthesia: LOCAL | Laterality: Right

## 2015-08-20 MED ORDER — OXYCODONE HCL 5 MG PO TABS: 5.0000 mg | ORAL_TABLET | ORAL | Status: DC | PRN

## 2015-08-20 MED ORDER — DIPHENHYDRAMINE HCL 25 MG PO CAPS
25.0000 mg | ORAL_CAPSULE | ORAL | Status: AC
  Administered 2015-08-20: 25 mg via ORAL
  Filled 2015-08-20: qty 1

## 2015-08-20 MED ORDER — CIPROFLOXACIN HCL 500 MG PO TABS
500.0000 mg | ORAL_TABLET | ORAL | Status: AC
  Administered 2015-08-20: 500 mg via ORAL
  Filled 2015-08-20: qty 1

## 2015-08-20 MED ORDER — SODIUM CHLORIDE 0.9 % IV SOLN: 250.0000 mL | INTRAVENOUS | Status: DC | PRN

## 2015-08-20 MED ORDER — SODIUM CHLORIDE 0.9 % IJ SOLN: 3.0000 mL | Freq: Two times a day (BID) | INTRAMUSCULAR | Status: DC

## 2015-08-20 MED ORDER — ACETAMINOPHEN 650 MG RE SUPP
650.0000 mg | RECTAL | Status: DC | PRN
  Filled 2015-08-20: qty 1

## 2015-08-20 MED ORDER — OXYCODONE-ACETAMINOPHEN 10-325 MG PO TABS: 1.0000 | ORAL_TABLET | ORAL | Status: DC | PRN

## 2015-08-20 MED ORDER — SODIUM CHLORIDE 0.9 % IJ SOLN: 3.0000 mL | INTRAMUSCULAR | Status: DC | PRN

## 2015-08-20 MED ORDER — SODIUM CHLORIDE 0.9 % IV SOLN
INTRAVENOUS | Status: DC
  Administered 2015-08-20: 09:00:00 via INTRAVENOUS

## 2015-08-20 MED ORDER — FENTANYL CITRATE (PF) 100 MCG/2ML IJ SOLN: 25.0000 ug | INTRAMUSCULAR | Status: DC | PRN

## 2015-08-20 MED ORDER — ACETAMINOPHEN 325 MG PO TABS: 650.0000 mg | ORAL_TABLET | ORAL | Status: DC | PRN

## 2015-08-20 MED ORDER — DIAZEPAM 5 MG PO TABS
10.0000 mg | ORAL_TABLET | ORAL | Status: AC
  Administered 2015-08-20: 10 mg via ORAL
  Filled 2015-08-20: qty 2

## 2015-08-20 NOTE — Interval H&P Note (Signed)
History and Physical Interval Note:  08/20/2015 10:57 AM  Aaron Gomez  has presented today for surgery, with the diagnosis of RIGHT UPPER QUADRANT STONE, RIGHT FLANK PAIN  The various methods of treatment have been discussed with the patient and family. After consideration of risks, benefits and other options for treatment, the patient has consented to  Procedure(s): RIGHT EXTRACORPOREAL SHOCK WAVE LITHOTRIPSY (ESWL) (Right) as a surgical intervention .  The patient's history has been reviewed, patient examined, no change in status, stable for surgery.  I have reviewed the patient's chart and labs.  Questions were answered to the patient's satisfaction.     Ahlaya Ende,Jurrell J

## 2015-08-20 NOTE — Discharge Instructions (Signed)

## 2015-08-24 ENCOUNTER — Other Ambulatory Visit: Payer: Self-pay | Admitting: Family Medicine

## 2015-08-24 ENCOUNTER — Other Ambulatory Visit (INDEPENDENT_AMBULATORY_CARE_PROVIDER_SITE_OTHER): Payer: BLUE CROSS/BLUE SHIELD

## 2015-08-24 DIAGNOSIS — E039 Hypothyroidism, unspecified: Secondary | ICD-10-CM

## 2015-08-24 LAB — TSH: TSH: 5.49 u[IU]/mL — AB (ref 0.35–4.50)

## 2015-08-24 MED ORDER — LEVOTHYROXINE SODIUM 88 MCG PO TABS: 88.0000 ug | ORAL_TABLET | Freq: Every day | ORAL | Status: DC

## 2015-10-09 ENCOUNTER — Other Ambulatory Visit: Payer: Self-pay | Admitting: *Deleted

## 2015-10-09 MED ORDER — LEVOTHYROXINE SODIUM 88 MCG PO TABS: 88.0000 ug | ORAL_TABLET | Freq: Every day | ORAL | Status: DC

## 2015-11-02 ENCOUNTER — Other Ambulatory Visit: Payer: Self-pay | Admitting: Family Medicine

## 2015-11-18 HISTORY — PX: COLONOSCOPY: SHX174

## 2016-01-11 ENCOUNTER — Ambulatory Visit (INDEPENDENT_AMBULATORY_CARE_PROVIDER_SITE_OTHER): Payer: BLUE CROSS/BLUE SHIELD | Admitting: Family Medicine

## 2016-01-11 ENCOUNTER — Encounter: Payer: Self-pay | Admitting: Family Medicine

## 2016-01-11 VITALS — BP 120/72 | HR 69 | Temp 98.6°F | Wt 197.0 lb

## 2016-01-11 DIAGNOSIS — E039 Hypothyroidism, unspecified: Secondary | ICD-10-CM

## 2016-01-11 DIAGNOSIS — B353 Tinea pedis: Secondary | ICD-10-CM | POA: Diagnosis not present

## 2016-01-11 DIAGNOSIS — E785 Hyperlipidemia, unspecified: Secondary | ICD-10-CM | POA: Diagnosis not present

## 2016-01-11 DIAGNOSIS — L309 Dermatitis, unspecified: Secondary | ICD-10-CM

## 2016-01-11 DIAGNOSIS — G43009 Migraine without aura, not intractable, without status migrainosus: Secondary | ICD-10-CM

## 2016-01-11 LAB — TSH: TSH: 2.65 u[IU]/mL (ref 0.35–4.50)

## 2016-01-11 NOTE — Assessment & Plan Note (Addendum)
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 Wants to discuss with dermatology as well

## 2016-01-11 NOTE — Assessment & Plan Note (Signed)
S: mild poor control previously with  Lab Results  Component Value Date   TSH 5.49* 08/24/2015  despite levothyroxine- titrated to 88 mcg previously A/P: check tsh today- titrate up as needed

## 2016-01-11 NOTE — Patient Instructions (Signed)
Thyroid test before you leave. May need changes/repeat in about 8 weeks.   We will call you within a week about your referral to dermatology. I would ask for their opinion on the feet as well as the spot on the wrist and shins. We considered triamcinolone but given your history of allergies- opted to have that discussion with dermatology.  If you do not hear within 2 weeks, give [place] a call.

## 2016-01-11 NOTE — Assessment & Plan Note (Signed)
S: compliant with atorvastatin 20mg . Shows lipid screen from work with LDL 84 within a few months A/P: continue current dose of medicine as well as aspirin given family history

## 2016-01-11 NOTE — Assessment & Plan Note (Signed)
S: no change in pattern. About 1 a month but with occasional clusters of 2-3 migraines in shorter period. For most part- maxalt and ibuprofen still helping A/P: continue current medicine without prophylaxis

## 2016-01-11 NOTE — Assessment & Plan Note (Addendum)
S: reports a long term history of eczema. He will get various red areas with fine scale on them for years. Most recently has some on his shins and right wrist which are in atypical areas for him.  O: 2 cm areas with 4 total between right wrist and bilateral shins- erythematous with fine scale. Also left hand with small erythematous papules A/P: we considered steroid cream- patient apprehensive with prior allergy to neomycin and anaphylaxis to try any topicals without derm input which is understandable. We have referred to dermatology at this time.

## 2016-01-11 NOTE — Progress Notes (Signed)
Garret Reddish, MD  Subjective:  Aaron Gomez is a 50 y.o. year old very pleasant male patient who presents for/with See problem oriented charting ROS- not ill appearing, no fever/chills. No new medications. Not immunocompromised. No mucus membrane involvement. No chest pain or shortness of breath. No headache or blurry vision.  Past Medical History-  Patient Active Problem List   Diagnosis Date Noted  . Hypothyroidism 07/06/2015    Priority: Medium  . 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 Hyperlipidemia 01/12/2009    Priority: Medium  . Asthma 01/12/2009    Priority: Medium  . Common migraine 01/12/2009    Priority: Medium  . Eczema 01/11/2016    Priority: Low  . TINEA PEDIS 09/25/2009    Priority: Low  . Allergic rhinitis 01/12/2009    Priority: Low  . GERD 01/12/2009    Priority: Low  . NEPHROLITHIASIS, HX OF 01/12/2009    Priority: Low  . Snoring 02/23/2015    Medications- reviewed and updated Current Outpatient Prescriptions  Medication Sig Dispense Refill  . aspirin 81 MG tablet Take 81 mg by mouth daily.      [name] atorvastatin (LIPITOR) 20 MG tablet Take 1 tablet (20 mg total) by mouth daily. (Patient taking differently: Take 20 mg by mouth every morning. ) 90 tablet 3  . fexofenadine (ALLEGRA) 180 MG tablet Take 180 mg by mouth daily.      [name] ibuprofen (ADVIL,MOTRIN) 800 MG tablet Take 800 mg by mouth every 8 (eight) hours as needed.      [name] Ketorolac Tromethamine 15.75 MG/SPRAY SOLN Place 1 spray into the nose every 4 (four) hours as needed (migraines/ kidney stone pain.).     [name] levothyroxine (SYNTHROID, LEVOTHROID) 88 MCG tablet Take 1 tablet (88 mcg total) by mouth daily. 90 tablet 1  . mometasone (NASONEX) 50 MCG/ACT nasal spray Place 2 sprays into the nose daily.      . ranitidine (ZANTAC) 150 MG capsule Take 150 mg by mouth 2 (two) times daily.      . Tamsulosin HCl (FLOMAX) 0.4 MG CAPS Take 0.4 mg by mouth every morning.     [name] albuterol (PROAIR HFA) 108  (90 BASE) MCG/ACT inhaler Inhale 2 puffs into the lungs every 6 (six) hours as needed for wheezing or shortness of breath. Reported on 01/11/2016    . EPINEPHrine (EPIPEN) 0.3 mg/0.3 mL DEVI Inject 0.3 mg into the muscle once. Reported on 01/11/2016    . oxyCODONE-acetaminophen (PERCOCET) 10-325 MG tablet Take 1 tablet by mouth every 4 (four) hours as needed for pain (migraine.). (Patient not taking: Reported on 01/11/2016) 30 tablet 0  . rizatriptan (MAXALT-MLT) 10 MG disintegrating tablet Take 1 tablet (10 mg total) by mouth daily as needed for migraine. DISSOLVE ONE TABLET IN MOUTH AS NEEDED (Patient not taking: Reported on 07/06/2015) 12 tablet 5   No current facility-administered medications for this visit.    Objective: BP 120/72 mmHg  Pulse 69  Temp(Src) 98.6 F (37 C)  Wt 197 lb (89.359 kg) Gen: NAD, resting comfortably No thyromegaly CV: RRR no murmurs rubs or gallops Lungs: CTAB no crackles, wheeze, rhonchi Abdomen: soft/nontender/nondistended/normal bowel sounds.  Ext: no edema Skin: warm, dry, see exam below. In addition does have 1 small blister on outer portion of right great toe Neuro: grossly normal, moves all extremities, normal gait  Assessment/Plan:  Eczema S: reports a long term history of eczema. He will get various red areas with fine scale on them for  years. Most recently has some on his shins and right wrist which are in atypical areas for him.  O: 2 cm areas with 4 total between right wrist and bilateral shins- erythematous with fine scale. Also left hand with small erythematous papules A/P: we considered steroid cream- patient apprehensive with prior allergy to neomycin and anaphylaxis to try any topicals without derm input which is understandable. We have referred to dermatology at this time.    TINEA PEDIS He also has recurrent blisters on feet- better if can air them out but has difficulty with this due to his work where must where black polished shoes for >8  hours at a time. Wants to discuss with dermatology as well  Hypothyroidism S: mild poor control previously with  Lab Results  Component Value Date   TSH 5.49* 08/24/2015  despite levothyroxine- titrated to 88 mcg previously A/P: check tsh today- titrate up as needed   Hyperlipidemia S: compliant with atorvastatin 20mg . Shows lipid screen from work with LDL 84 within a few months A/P: continue current dose of medicine as well as aspirin given family history   Common migraine S: no change in pattern. About 1 a month but with occasional clusters of 2-3 migraines in shorter period. For most part- maxalt and ibuprofen still helping A/P: continue current medicine without prophylaxis     Return in about 6 months (around 07/10/2016) for physical. 07/06/16 or later. Return precautions advised.   Orders Placed This Encounter  Procedures  . TSH    [facility]  . Ambulatory referral to Dermatology    Referral Priority:  Routine    Referral Type:  Consultation    Referral Reason:  Specialty Services Required    Requested Specialty:  Dermatology    Number of Visits Requested:  1

## 2016-03-24 ENCOUNTER — Other Ambulatory Visit: Payer: Self-pay | Admitting: Family Medicine

## 2016-04-24 ENCOUNTER — Other Ambulatory Visit: Payer: Self-pay | Admitting: Family Medicine

## 2016-06-19 ENCOUNTER — Other Ambulatory Visit: Payer: Self-pay | Admitting: Family Medicine

## 2016-07-04 ENCOUNTER — Other Ambulatory Visit: Payer: Self-pay | Admitting: Family Medicine

## 2016-07-04 ENCOUNTER — Other Ambulatory Visit (INDEPENDENT_AMBULATORY_CARE_PROVIDER_SITE_OTHER): Payer: BLUE CROSS/BLUE SHIELD

## 2016-07-04 DIAGNOSIS — R319 Hematuria, unspecified: Secondary | ICD-10-CM

## 2016-07-04 DIAGNOSIS — R7989 Other specified abnormal findings of blood chemistry: Secondary | ICD-10-CM | POA: Diagnosis not present

## 2016-07-04 DIAGNOSIS — E039 Hypothyroidism, unspecified: Secondary | ICD-10-CM | POA: Diagnosis not present

## 2016-07-04 DIAGNOSIS — Z0000 Encounter for general adult medical examination without abnormal findings: Secondary | ICD-10-CM

## 2016-07-04 LAB — BASIC METABOLIC PANEL
BUN: 12 mg/dL (ref 6–23)
CALCIUM: 9.3 mg/dL (ref 8.4–10.5)
CO2: 28 mEq/L (ref 19–32)
Chloride: 103 mEq/L (ref 96–112)
Creatinine, Ser: 1.15 mg/dL (ref 0.40–1.50)
GFR: 71.57 mL/min (ref >60.00)
GLUCOSE: 88 mg/dL (ref 70–99)
Potassium: 4.2 mEq/L (ref 3.5–5.1)
Sodium: 139 mEq/L (ref 135–145)

## 2016-07-04 LAB — POC URINALSYSI DIPSTICK (AUTOMATED)
BILIRUBIN UA: NEGATIVE
GLUCOSE UA: NEGATIVE
KETONES UA: NEGATIVE
Leukocytes, UA: NEGATIVE
Nitrite, UA: NEGATIVE
Protein, UA: NEGATIVE
SPEC GRAV UA: 1.02
UROBILINOGEN UA: 0.2
pH, UA: 5.5

## 2016-07-04 LAB — LIPID PANEL
CHOL/HDL RATIO: 4
CHOLESTEROL: 145 mg/dL (ref 0–200)
HDL: 40.9 mg/dL (ref >39.00)
NonHDL: 104.52
Triglycerides: 207 mg/dL — ABNORMAL HIGH (ref 0.0–149.0)
VLDL: 41.4 mg/dL — AB (ref 0.0–40.0)

## 2016-07-04 LAB — HEPATIC FUNCTION PANEL
ALK PHOS: 78 U/L (ref 39–117)
ALT: 38 U/L (ref 0–53)
AST: 23 U/L (ref 0–37)
Albumin: 4.2 g/dL (ref 3.5–5.2)
BILIRUBIN TOTAL: 0.5 mg/dL (ref 0.2–1.2)
Bilirubin, Direct: 0.1 mg/dL (ref 0.0–0.3)
Total Protein: 6.3 g/dL (ref 6.0–8.3)

## 2016-07-04 LAB — CBC WITH DIFFERENTIAL/PLATELET
BASOS ABS: 0 10*3/uL (ref 0.0–0.1)
Basophils Relative: 0.6 % (ref 0.0–3.0)
Eosinophils Absolute: 0.4 10*3/uL (ref 0.0–0.7)
Eosinophils Relative: 5.1 % — ABNORMAL HIGH (ref 0.0–5.0)
HCT: 42.8 % (ref 39.0–52.0)
Hemoglobin: 14.6 g/dL (ref 13.0–17.0)
LYMPHS ABS: 2 10*3/uL (ref 0.7–4.0)
Lymphocytes Relative: 27.7 % (ref 12.0–46.0)
MCHC: 34.1 g/dL (ref 30.0–36.0)
MCV: 89.9 fl (ref 78.0–100.0)
MONO ABS: 0.8 10*3/uL (ref 0.1–1.0)
Monocytes Relative: 11.7 % (ref 3.0–12.0)
NEUTROS PCT: 54.9 % (ref 43.0–77.0)
Neutro Abs: 3.9 10*3/uL (ref 1.4–7.7)
Platelets: 271 10*3/uL (ref 150.0–400.0)
RBC: 4.75 Mil/uL (ref 4.22–5.81)
RDW: 13.1 % (ref 11.5–15.5)
WBC: 7.2 10*3/uL (ref 4.0–10.5)

## 2016-07-04 LAB — TSH: TSH: 3.57 u[IU]/mL (ref 0.35–4.50)

## 2016-07-04 LAB — LDL CHOLESTEROL, DIRECT: LDL DIRECT: 73 mg/dL

## 2016-07-04 LAB — URINALYSIS, MICROSCOPIC ONLY: RBC / HPF: NONE SEEN (ref 0–?)

## 2016-07-04 LAB — PSA: PSA: 3.93 ng/mL (ref 0.10–4.00)

## 2016-07-11 ENCOUNTER — Ambulatory Visit (INDEPENDENT_AMBULATORY_CARE_PROVIDER_SITE_OTHER): Payer: BLUE CROSS/BLUE SHIELD | Admitting: Family Medicine

## 2016-07-11 ENCOUNTER — Encounter: Payer: Self-pay | Admitting: Family Medicine

## 2016-07-11 ENCOUNTER — Encounter: Payer: Self-pay | Admitting: Internal Medicine

## 2016-07-11 VITALS — BP 114/76 | HR 65 | Temp 98.3°F | Ht 71.5 in | Wt 200.2 lb

## 2016-07-11 DIAGNOSIS — Z1211 Encounter for screening for malignant neoplasm of colon: Secondary | ICD-10-CM

## 2016-07-11 DIAGNOSIS — Z0000 Encounter for general adult medical examination without abnormal findings: Secondary | ICD-10-CM | POA: Diagnosis not present

## 2016-07-11 NOTE — Progress Notes (Signed)
Pre visit review using our clinic review tool, if applicable. No additional management support is needed unless otherwise documented below in the visit note. 

## 2016-07-11 NOTE — Patient Instructions (Signed)
No changes to medicines  Please call urology and schedule follow up- please take your labs from today with you.   We will call you within a week about your referral to GI for colonoscopy. If you do not hear within 2 weeks, give [place] a call.

## 2016-07-11 NOTE — Progress Notes (Signed)
Phone: [phone]  Subjective:  Patient presents today for their annual physical. Chief complaint-noted.   See problem oriented charting- ROS- full  review of systems was completed and negative including No chest pain. SOB with asthma but controlled with albuterol. Migraines noted. No  blurry vision.    The following were reviewed and entered/updated in epic: Past Medical History:  Diagnosis Date  . Allergy    venom anaphylaxis  . Asthma    extrinsic  . 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  . GERD (gastroesophageal reflux disease)   . Headache(784.0)   . Hyperlipidemia   . Hypothyroidism   . Nephrolithiasis     X 8,Dr Tannebaum   Patient Active Problem List   Diagnosis Date Noted  . Hypothyroidism 07/06/2015    Priority: Medium  . 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 Hyperlipidemia 01/12/2009    Priority: Medium  . Asthma 01/12/2009    Priority: Medium  . Common migraine 01/12/2009    Priority: Medium  . Eczema 01/11/2016    Priority: Low  . TINEA PEDIS 09/25/2009    Priority: Low  . Allergic rhinitis 01/12/2009    Priority: Low  . GERD 01/12/2009    Priority: Low  . NEPHROLITHIASIS, HX OF 01/12/2009    Priority: Low  . Snoring 02/23/2015   Past Surgical History:  Procedure Laterality Date  . ELBOW SURGERY     Tommy Tlaloc's  . KNEE ARTHROSCOPY  2011   R knee; Dr Maureen Ralphs  . LUMBAR FUSION  2004   Dr Trenton Gammon  . MICRODISCECTOMY LUMBAR  1999   L4-5,L5-S1;Dr. Trenton Gammon  . TONSILLECTOMY      Family History  Problem Relation Age of Onset  . Thyroid disease Mother   . Heart attack Father 107    died @ 94  . Heart attack Brother 47    1/2 brother  . Crohn's disease Brother   . Hypertension Paternal Grandmother   . Heart disease Paternal Grandmother     MI in 1s  . Diabetes Paternal Grandmother   . Heart attack Paternal Grandfather 74  . Heart attack Paternal Aunt 68    Medications- reviewed and updated Current Outpatient Prescriptions  Medication  Sig Dispense Refill  . albuterol (PROAIR HFA) 108 (90 BASE) MCG/ACT inhaler Inhale 2 puffs into the lungs every 6 (six) hours as needed for wheezing or shortness of breath. Reported on 01/11/2016    . aspirin 81 MG tablet Take 81 mg by mouth daily.      [name] atorvastatin (LIPITOR) 20 MG tablet TAKE 1 TABLET (20 MG TOTAL) BY MOUTH DAILY. 90 tablet 1  . EPINEPHrine (EPIPEN) 0.3 mg/0.3 mL DEVI Inject 0.3 mg into the muscle once. Reported on 01/11/2016    . fexofenadine (ALLEGRA) 180 MG tablet Take 180 mg by mouth daily.      [name] ibuprofen (ADVIL,MOTRIN) 800 MG tablet Take 800 mg by mouth every 8 (eight) hours as needed.      [name] Ketorolac Tromethamine 15.75 MG/SPRAY SOLN Place 1 spray into the nose every 4 (four) hours as needed (migraines/ kidney stone pain.).     [name] levothyroxine (SYNTHROID, LEVOTHROID) 88 MCG tablet Take 1 tablet (88 mcg total) by mouth daily. 90 tablet 1  . levothyroxine (SYNTHROID, LEVOTHROID) 88 MCG tablet TAKE 1 TABLET BY MOUTH DAILY. 30 tablet 5  . mometasone (NASONEX) 50 MCG/ACT nasal spray Place 2 sprays into the nose daily.      [name] oxyCODONE-acetaminophen (PERCOCET) 10-325 MG  tablet Take 1 tablet by mouth every 4 (four) hours as needed for pain (migraine.). 30 tablet 0  . ranitidine (ZANTAC) 150 MG capsule Take 150 mg by mouth 2 (two) times daily.      . rizatriptan (MAXALT-MLT) 10 MG disintegrating tablet TAKE 1 TABLET BY MOUTH EVERY DAY AS NEEDED FOR MIGRAINE. DISSOLVE IN MOUTH 12 tablet 5  . Tamsulosin HCl (FLOMAX) 0.4 MG CAPS Take 0.4 mg by mouth every morning.      No current facility-administered medications for this visit.     Allergies-reviewed and updated Allergies  Allergen Reactions  . Bee Venom     Also venom from wasps & hornets cause anaphylaxis; he carries Epi-Pen & has Medi Alert bracelet  . Neomycin Sulfate     Anaphylactic shock  Because of a history of documented adverse serious drug reaction;Medi Alert bracelet  is recommended  . Tape Anaphylaxis     adhesvie tape-rash, blisters  . Venomil Mixed Vespid  [Mixed Vespid Venom] Anaphylaxis  . Banana   . Strawberry Extract   . Percocet [Oxycodone-Acetaminophen] Nausea Only    Social History   Social History  . Marital status: Married    Spouse name: N/A  . Number of children: N/A  . Years of education: N/A   Social History Main Topics  . Smoking status: Former Smoker    Quit date: 11/17/1986  . Smokeless tobacco: None     Comment: smoked 1980-1988, up to 1 ppd  . Alcohol use Yes     Comment: minimally  . Drug use: No  . Sexual activity: Not Asked   Other Topics Concern  . None   Social History Narrative   Married. 2 kids-son and daughter.       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 lot of outdoor)    Objective: BP 114/76 (BP Location: Left Arm, Patient Position: Sitting, Cuff Size: Normal)   Pulse 65   Temp 98.3 F (36.8 C) (Oral)   Ht 5' 11.5" (1.816 m)   Wt 200 lb 3.2 oz (90.8 kg)   SpO2 96%   BMI 27.53 kg/m  Gen: NAD, resting comfortably HEENT: Mucous membranes are moist. Oropharynx normal Neck: no thyromegaly CV: RRR no murmurs rubs or gallops Lungs: CTAB no crackles, wheeze, rhonchi Abdomen: soft/nontender/nondistended/normal bowel sounds. No rebound or guarding.  Ext: no edema Skin: warm, dry Neuro: grossly normal, moves all extremities, PERRLA Rectal: normal tone, diffusely enlarged prostate, no masses or tenderness  Assessment/Plan:  50 y.o. male presenting for annual physical.  Health Maintenance counseling: 1. Anticipatory guidance: Patient counseled regarding regular dental exams, eye exams, wearing seatbelts.  2. Risk factor reduction:  Advised patient of need for regular exercise and diet rich and fruits and vegetables to reduce risk of heart attack and stroke. See discussion under snoring below 3. Immunizations/screenings/ancillary studies- flu shot will get free through work- send [place] a message  when you get it.  Immunization History  Administered Date(s) Administered  . Influenza,inj,Quad PF,36+ Mos 08/17/2013  . Influenza-Unspecified 11/02/2015  . Td 11/17/2001  . Tdap 02/02/2012   4. Prostate cancer screening- PSA more elevated for age than expected, although rectal reassuring- Already established with Dr. Gaynelle Arabian. He will schedule follow up with him. Some BPH on exam- possible cause Lab Results  Component Value Date   PSA 3.93 07/04/2016   5. Colon cancer screening - no family history and turns 59 next month- refer  at this time  6. Skin cancer screening- sees derm intermittently  Status of chronic or acute concerns  Hypothyroidism- well controlled on levothyroxine 88 mcg  HLD- well controlled on atorvastatin 20mg , also on asa. Strong family history with premature MI in father  Migraines- still averaging 1 a month- uses maxalt or ibuprofen. No true trigger  Asthma- followed by  allergy. Also history of anaphylaxis with bee stings- carries epi pens. Allergic rhinitis managed there as well  Snoring- we had discussed weight loss and if not improving refer to pulm . Has started running 3-4 x a week to try to reverse weight trend (for 1 month)- has been eating eating more since running though. Had been doing blue apron now has dropped that- was eating healthier. Needs to work on portion size. Does not like veggies Wt Readings from Last 3 Encounters:  07/11/16 200 lb 3.2 oz (90.8 kg)  01/11/16 197 lb (89.4 kg)  08/20/15 189 lb 4 oz (85.8 kg)   Foot blisters- spread then goes away  No problem-specific Assessment & Plan notes found for this encounter.   No Follow-up on file.  No orders of the defined types were placed in this encounter.   No orders of the defined types were placed in this encounter.   Return precautions advised.   Garret Reddish, MD

## 2016-07-14 ENCOUNTER — Encounter: Payer: Self-pay | Admitting: Family Medicine

## 2016-07-14 ENCOUNTER — Ambulatory Visit (INDEPENDENT_AMBULATORY_CARE_PROVIDER_SITE_OTHER): Payer: BLUE CROSS/BLUE SHIELD | Admitting: Family Medicine

## 2016-07-14 VITALS — BP 120/82 | HR 65 | Temp 97.9°F | Wt 204.2 lb

## 2016-07-14 DIAGNOSIS — R599 Enlarged lymph nodes, unspecified: Secondary | ICD-10-CM | POA: Diagnosis not present

## 2016-07-14 DIAGNOSIS — R590 Localized enlarged lymph nodes: Secondary | ICD-10-CM

## 2016-07-14 NOTE — Progress Notes (Signed)
Subjective:  Aaron Gomez is a 50 y.o. year old very pleasant male patient who presents for/with See problem oriented charting ROS- see any ROS included in HPI as well.   Past Medical History-  Patient Active Problem List   Diagnosis Date Noted  . Hypothyroidism 07/06/2015    Priority: Medium  . 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 Hyperlipidemia 01/12/2009    Priority: Medium  . Asthma 01/12/2009    Priority: Medium  . Common migraine 01/12/2009    Priority: Medium  . Eczema 01/11/2016    Priority: Low  . TINEA PEDIS 09/25/2009    Priority: Low  . Allergic rhinitis 01/12/2009    Priority: Low  . GERD 01/12/2009    Priority: Low  . NEPHROLITHIASIS, HX OF 01/12/2009    Priority: Low  . Snoring 02/23/2015    Medications- reviewed and updated Current Outpatient Prescriptions  Medication Sig Dispense Refill  . albuterol (PROAIR HFA) 108 (90 BASE) MCG/ACT inhaler Inhale 2 puffs into the lungs every 6 (six) hours as needed for wheezing or shortness of breath. Reported on 01/11/2016    . aspirin 81 MG tablet Take 81 mg by mouth daily.      [name] atorvastatin (LIPITOR) 20 MG tablet TAKE 1 TABLET (20 MG TOTAL) BY MOUTH DAILY. 90 tablet 1  . EPINEPHrine (EPIPEN) 0.3 mg/0.3 mL DEVI Inject 0.3 mg into the muscle once. Reported on 01/11/2016    . fexofenadine (ALLEGRA) 180 MG tablet Take 180 mg by mouth daily.      [name] ibuprofen (ADVIL,MOTRIN) 800 MG tablet Take 800 mg by mouth every 8 (eight) hours as needed.      [name] Ketorolac Tromethamine 15.75 MG/SPRAY SOLN Place 1 spray into the nose every 4 (four) hours as needed (migraines/ kidney stone pain.).     [name] levothyroxine (SYNTHROID, LEVOTHROID) 88 MCG tablet Take 1 tablet (88 mcg total) by mouth daily. 90 tablet 1  . levothyroxine (SYNTHROID, LEVOTHROID) 88 MCG tablet TAKE 1 TABLET BY MOUTH DAILY. 30 tablet 5  . mometasone (NASONEX) 50 MCG/ACT nasal spray Place 2 sprays into the nose daily.      [name] oxyCODONE-acetaminophen (PERCOCET)  10-325 MG tablet Take 1 tablet by mouth every 4 (four) hours as needed for pain (migraine.). 30 tablet 0  . ranitidine (ZANTAC) 150 MG capsule Take 150 mg by mouth 2 (two) times daily.      . rizatriptan (MAXALT-MLT) 10 MG disintegrating tablet TAKE 1 TABLET BY MOUTH EVERY DAY AS NEEDED FOR MIGRAINE. DISSOLVE IN MOUTH 12 tablet 5  . Tamsulosin HCl (FLOMAX) 0.4 MG CAPS Take 0.4 mg by mouth every morning.      No current facility-administered medications for this visit.     Objective: BP 120/82 (BP Location: Left Arm, Patient Position: Sitting, Cuff Size: Normal)   Pulse 65   Temp 97.9 F (36.6 C) (Oral)   Wt 204 lb 3.2 oz (92.6 kg)   SpO2 97%   BMI 28.08 kg/m  Gen: NAD, resting comfortably 3 lymph nodes postauricularly noted with lymphadenopathy with largest in center at <1 cm. TM normal. No surrounding warmth- mild swelling aruond this- mild erythema. Oropharynx and nares normal. Scalp normal without signs of infection. No skin changes upper body. No other lymph node involvement from waist up.  Good dentition with no signs of infection CV: RRR no murmurs rubs or gallops Lungs: CTAB no crackles, wheeze, rhonchi Abdomen: soft/nontender/nondistended/normal bowel sounds. No rebound or guarding.  Ext: no edema Skin:  warm, dry, no issues other than subq nodules already noted.       Assessment/Plan:  Painful knot behind right ear  S: On Sunday patient noted warm red area behind right ear that was also swollen. Seemed to calm down inflammation wise through day  And then noted 3 small nodules subcutaneously with largest < 1 cm. Seemed to decrease into today. Pain 4-5/10 from lesions rated as achy pain. Ibuprofen helped some yesterday. ROS- no rash including on head/scalp, no other ares of pain, feels well overall- no fever, chills, nausea, vomiting, fatigue. No cough, cold congestion A/P: we are going to try 5 days of nsaids for pain component but discussed these are likely postauricular  lymph nodes. Discussed if persists for a month or if bothers him more or if simply desires- we can refer to ENT. If past a month would consider biopsy. No signs of infection causing this. Review of uptodate mentions relation to rubella but no other signs/symptoms of that and would not be likely. Other mention is infection of parieto- temporal scalp with no suggestion of that on exam. Patient may use mychart to request referral. monogamous doubt syphilis related  Update 1 month if persists.   The duration of face-to-face time during this visit was 15 minutes. Greater than 50% of this time was spent in counseling, explanation of diagnosis, planning of further management, and/or coordination of care.   Return precautions advised.  Garret Reddish, MD

## 2016-07-14 NOTE — Progress Notes (Signed)
Pre visit review using our clinic review tool, if applicable. No additional management support is needed unless otherwise documented below in the visit note. 

## 2016-07-14 NOTE — Patient Instructions (Signed)
Schedule 1 aleve twice a day for 5 days to try to calm down pain and inflammation  I think your body is fighting something off but just not clear what- let me know if you discover potential infection because I am interested but could not locate any today  Normally- enlarged lymph nodes can take up to a month to resolve but hopeful the pain slowly improves before then even if lymph node is still enlarged

## 2016-07-16 ENCOUNTER — Encounter: Payer: Self-pay | Admitting: Family Medicine

## 2016-07-16 ENCOUNTER — Ambulatory Visit (INDEPENDENT_AMBULATORY_CARE_PROVIDER_SITE_OTHER): Payer: BLUE CROSS/BLUE SHIELD | Admitting: Family Medicine

## 2016-07-16 VITALS — BP 122/80 | HR 65 | Temp 98.3°F | Wt 201.8 lb

## 2016-07-16 DIAGNOSIS — B029 Zoster without complications: Secondary | ICD-10-CM

## 2016-07-16 MED ORDER — VALACYCLOVIR HCL 1 G PO TABS: 1000.0000 mg | ORAL_TABLET | Freq: Three times a day (TID) | ORAL | 0 refills | Status: DC

## 2016-07-16 NOTE — Patient Instructions (Addendum)
Shingles- treat with valtrex for 7 days 3x a day  If any concern that you have a lesion near the eye- need to see optho ASAP- call us until you get Roselyn Reef or me and we will do a stat referral.   Another option would just to go see your optometrist   Shingles Shingles, which is also known as herpes zoster, is an infection that causes a painful skin rash and fluid-filled blisters. Shingles is not related to genital herpes, which is a sexually transmitted infection.   Shingles only develops in people who:  Have had chickenpox.  Have received the chickenpox vaccine. (This is rare.) CAUSES Shingles is caused by varicella-zoster virus (VZV). This is the same virus that causes chickenpox. After exposure to VZV, the virus stays in the body in an inactive (dormant) state. Shingles develops if the virus reactivates. This can happen many years after the initial exposure to VZV. It is not known what causes this virus to reactivate. RISK FACTORS People who have had chickenpox or received the chickenpox vaccine are at risk for shingles. Infection is more common in people who:  Are older than age 41.  Have a weakened defense (immune) system, such as those with HIV, AIDS, or cancer.  Are taking medicines that weaken the immune system, such as transplant medicines.  Are under great stress. SYMPTOMS Early symptoms of this condition include itching, tingling, and pain in an area on your skin. Pain may be described as burning, stabbing, or throbbing. A few days or weeks after symptoms start, a painful red rash appears, usually on one side of the body in a bandlike or beltlike pattern. The rash eventually turns into fluid-filled blisters that break open, scab over, and dry up in about 2-3 weeks. At any time during the infection, you may also develop:  A fever.  Chills.  A headache.  An upset stomach. DIAGNOSIS This condition is diagnosed with a skin exam. Sometimes, skin or fluid samples are taken  from the blisters before a diagnosis is made. These samples are examined under a microscope or sent to a lab for testing. TREATMENT There is no specific cure for this condition. Your health care provider will probably prescribe medicines to help you manage pain, recover more quickly, and avoid long-term problems. Medicines may include:  Antiviral drugs.  Anti-inflammatory drugs.  Pain medicines. If the area involved is on your face, you may be referred to a specialist, such as an eye doctor (ophthalmologist) or an ear, nose, and throat (ENT) doctor to help you avoid eye problems, chronic pain, or disability. HOME CARE INSTRUCTIONS Medicines  Take medicines only as directed by your health care provider.  Apply an anti-itch or numbing cream to the affected area as directed by your health care provider. Blister and Rash Care  Take a cool bath or apply cool compresses to the area of the rash or blisters as directed by your health care provider. This may help with pain and itching.  Keep your rash covered with a loose bandage (dressing). Wear loose-fitting clothing to help ease the pain of material rubbing against the rash.  Keep your rash and blisters clean with mild soap and cool water or as directed by your health care provider.  Check your rash every day for signs of infection. These include redness, swelling, and pain that lasts or increases.  Do not pick your blisters.  Do not scratch your rash. General Instructions  Rest as directed by your health care provider.  Keep all follow-up visits as directed by your health care provider. This is important.  Until your blisters scab over, your infection can cause chickenpox in people who have never had it or been vaccinated against it. To prevent this from happening, avoid contact with other people, especially:  Babies.  Pregnant women.  Children who have eczema.  Elderly people who have transplants.  People who have chronic  illnesses, such as leukemia or AIDS. SEEK MEDICAL CARE IF:  Your pain is not relieved with prescribed medicines.  Your pain does not get better after the rash heals.  Your rash looks infected. Signs of infection include redness, swelling, and pain that lasts or increases. SEEK IMMEDIATE MEDICAL CARE IF:  The rash is on your face or nose.  You have facial pain, pain around your eye area, or loss of feeling on one side of your face.  You have ear pain or you have ringing in your ear.  You have loss of taste.  Your condition gets worse.   This information is not intended to replace advice given to you by your health care provider. Make sure you discuss any questions you have with your health care provider.   Document Released: 11/03/2005 Document Revised: 11/24/2014 Document Reviewed: 09/14/2014 Elsevier Interactive Patient Education [employer].

## 2016-07-16 NOTE — Progress Notes (Signed)
Subjective:  Aaron Gomez is a 50 y.o. year old very pleasant male patient who presents for/with See problem oriented charting ROS- no fever, chills, nausea, vomiting, fatigu.see any ROS included in HPI as well.   Past Medical History-  Patient Active Problem List   Diagnosis Date Noted  . Hypothyroidism 07/06/2015    Priority: Medium  . 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 Hyperlipidemia 01/12/2009    Priority: Medium  . Asthma 01/12/2009    Priority: Medium  . Common migraine 01/12/2009    Priority: Medium  . Eczema 01/11/2016    Priority: Low  . TINEA PEDIS 09/25/2009    Priority: Low  . Allergic rhinitis 01/12/2009    Priority: Low  . GERD 01/12/2009    Priority: Low  . NEPHROLITHIASIS, HX OF 01/12/2009    Priority: Low  . Snoring 02/23/2015    Medications- reviewed and updated Current Outpatient Prescriptions  Medication Sig Dispense Refill  . albuterol (PROAIR HFA) 108 (90 BASE) MCG/ACT inhaler Inhale 2 puffs into the lungs every 6 (six) hours as needed for wheezing or shortness of breath. Reported on 01/11/2016    . aspirin 81 MG tablet Take 81 mg by mouth daily.      [name] atorvastatin (LIPITOR) 20 MG tablet TAKE 1 TABLET (20 MG TOTAL) BY MOUTH DAILY. 90 tablet 1  . EPINEPHrine (EPIPEN) 0.3 mg/0.3 mL DEVI Inject 0.3 mg into the muscle once. Reported on 01/11/2016    . fexofenadine (ALLEGRA) 180 MG tablet Take 180 mg by mouth daily.      [name] ibuprofen (ADVIL,MOTRIN) 800 MG tablet Take 800 mg by mouth every 8 (eight) hours as needed.      [name] Ketorolac Tromethamine 15.75 MG/SPRAY SOLN Place 1 spray into the nose every 4 (four) hours as needed (migraines/ kidney stone pain.).     [name] levothyroxine (SYNTHROID, LEVOTHROID) 88 MCG tablet Take 1 tablet (88 mcg total) by mouth daily. 90 tablet 1  . levothyroxine (SYNTHROID, LEVOTHROID) 88 MCG tablet TAKE 1 TABLET BY MOUTH DAILY. 30 tablet 5  . mometasone (NASONEX) 50 MCG/ACT nasal spray Place 2 sprays into the nose daily.       [name] oxyCODONE-acetaminophen (PERCOCET) 10-325 MG tablet Take 1 tablet by mouth every 4 (four) hours as needed for pain (migraine.). 30 tablet 0  . ranitidine (ZANTAC) 150 MG capsule Take 150 mg by mouth 2 (two) times daily.      . rizatriptan (MAXALT-MLT) 10 MG disintegrating tablet TAKE 1 TABLET BY MOUTH EVERY DAY AS NEEDED FOR MIGRAINE. DISSOLVE IN MOUTH 12 tablet 5  . Tamsulosin HCl (FLOMAX) 0.4 MG CAPS Take 0.4 mg by mouth every morning.      No current facility-administered medications for this visit.     Objective: BP 122/80 (BP Location: Left Arm, Patient Position: Sitting, Cuff Size: Normal)   Pulse 65   Temp 98.3 F (36.8 C) (Oral)   Wt 201 lb 12.8 oz (91.5 kg)   SpO2 94%   BMI 27.75 kg/m  Gen: NAD, resting comfortably Postauricular lymphadenopathy persists On scalp there are multiple papules in v1 distribution on right- a few of these appear to be more obvious vesicles. No involvement periocularly- no involvement of nose CV: RRR no murmurs rubs or gallops Lungs: CTAB no crackles, wheeze, rhonchi Ext: no edema Skin: warm, dry Neuro: grossly normal, moves all extremities  Assessment/Plan:  Rash on face S: patient seen 2 days ago for postauricular lymphadenopathy and told to watch closely for  any skin changes in the scalp and neck or other signs of infection. Yesterday he noted multiple ares on his right forehead itching and burning and later that afternoon noted red bumps popping up that each had burning/stinging sensation. Seemed to worsen through today and scheduled follow upon right forehead, right eyebrown and into scalp all on right side. Pain rated as modareate- pain from lymphadnopathy still moderate as well A/P: v1 distribution for shingles noted today in rather young man. No periocular involvement though we discussed option of seeing optho at this time- he opts to watch closely and if any eye involvement or closer than eyebrow would do stat referral to optho. Will treat  with valtex for 7 days 1g TID. We had a discussion about potential duration of illness and pain including risks of postherpetic neuralgia. Also discussed vaccination a year after clarance potentially  Meds ordered this encounter  Medications  . valACYclovir (VALTREX) 1000 MG tablet    Sig: Take 1 tablet (1,000 mg total) by mouth 3 (three) times daily.    Dispense:  21 tablet    Refill:  0    Return precautions advised.  Garret Reddish, MD

## 2016-07-16 NOTE — Progress Notes (Signed)
Pre visit review using our clinic review tool, if applicable. No additional management support is needed unless otherwise documented below in the visit note. 

## 2016-08-08 ENCOUNTER — Other Ambulatory Visit: Payer: Self-pay | Admitting: Family Medicine

## 2016-08-22 ENCOUNTER — Encounter: Payer: Self-pay | Admitting: Family Medicine

## 2016-08-27 ENCOUNTER — Encounter: Payer: Self-pay | Admitting: Internal Medicine

## 2016-08-27 ENCOUNTER — Ambulatory Visit (AMBULATORY_SURGERY_CENTER): Payer: Self-pay | Admitting: *Deleted

## 2016-08-27 VITALS — Ht 72.0 in | Wt 202.8 lb

## 2016-08-27 DIAGNOSIS — Z1211 Encounter for screening for malignant neoplasm of colon: Secondary | ICD-10-CM

## 2016-08-27 NOTE — Progress Notes (Signed)
No allergies to eggs or soy. No problems with anesthesia.  Pt given Emmi instructions for colonoscopy  No oxygen use  No diet drug use  

## 2016-09-10 ENCOUNTER — Ambulatory Visit (AMBULATORY_SURGERY_CENTER): Payer: BLUE CROSS/BLUE SHIELD | Admitting: Internal Medicine

## 2016-09-10 ENCOUNTER — Encounter: Payer: Self-pay | Admitting: Internal Medicine

## 2016-09-10 VITALS — BP 113/84 | HR 58 | Temp 97.7°F | Resp 12 | Ht 72.0 in | Wt 202.0 lb

## 2016-09-10 DIAGNOSIS — Z1211 Encounter for screening for malignant neoplasm of colon: Secondary | ICD-10-CM

## 2016-09-10 DIAGNOSIS — Z1212 Encounter for screening for malignant neoplasm of rectum: Secondary | ICD-10-CM | POA: Diagnosis not present

## 2016-09-10 MED ORDER — SODIUM CHLORIDE 0.9 % IV SOLN: 500.0000 mL | INTRAVENOUS | Status: DC

## 2016-09-10 NOTE — Progress Notes (Signed)
Report to PACU, RN, vss, BBS= Clear.  

## 2016-09-10 NOTE — Op Note (Signed)
[facility] Patient Name: Aaron Gomez Procedure Date: 09/10/2016 9:03 AM MRN: [id number] Endoscopist: Gatha Mayer , MD Age: 50 Referring MD:  Date of Birth: 31-Aug-1966 Gender: Male Account #: [id number] Procedure:                Colonoscopy Indications:              Screening for colorectal malignant neoplasm, This                            is the patient's first colonoscopy Medicines:                Propofol per Anesthesia, Monitored Anesthesia Care Procedure:                Pre-Anesthesia Assessment:                           - Prior to the procedure, a History and Physical                            was performed, and patient medications and                            allergies were reviewed. The patient's tolerance of                            previous anesthesia was also reviewed. The risks                            and benefits of the procedure and the sedation                            options and risks were discussed with the patient.                            All questions were answered, and informed consent                            was obtained. Prior Anticoagulants: The patient has                            taken no previous anticoagulant or antiplatelet                            agents. ASA Grade Assessment: II - A patient with                            mild systemic disease. After reviewing the risks                            and benefits, the patient was deemed in                            satisfactory condition to undergo the procedure.  After obtaining informed consent, the colonoscope                            was passed under direct vision. Throughout the                            procedure, the patient's blood pressure, pulse, and                            oxygen saturations were monitored continuously. The                            Model CF-HQ190L [id number]) scope was introduced   through the anus and advanced to the the cecum,                            identified by appendiceal orifice and ileocecal                            valve. The colonoscopy was performed without                            difficulty. The patient tolerated the procedure                            well. The quality of the bowel preparation was                            excellent. The bowel preparation used was Miralax.                            The ileocecal valve, appendiceal orifice, and                            rectum were photographed. Scope In: 9:10:51 AM Scope Out: 9:24:33 AM Scope Withdrawal Time: 0 hours 10 minutes 44 seconds  Total Procedure Duration: 0 hours 13 minutes 42 seconds  Findings:                 The perianal and digital rectal examinations were                            normal. Pertinent negatives include normal prostate                            (size, shape, and consistency).                           Multiple diverticula were found in the sigmoid                            colon.                           The exam was otherwise without abnormality on  direct and retroflexion views. Complications:            No immediate complications. Estimated Blood Loss:     Estimated blood loss: none. Impression:               - Moderate diverticulosis in the sigmoid colon.                           - The examination was otherwise normal on direct                            and retroflexion views.                           - No specimens collected. Recommendation:           - Patient has a contact number available for                            emergencies. The signs and symptoms of potential                            delayed complications were discussed with the                            patient. Return to normal activities tomorrow.                            Written discharge instructions were provided to the                             patient.                           - Resume previous diet.                           - Continue present medications.                           - Repeat colonoscopy in 10 years for screening                            purposes. Gatha Mayer, MD 09/10/2016 9:28:59 AM This report has been signed electronically.

## 2016-09-10 NOTE — Patient Instructions (Addendum)
No polyps or cancer seen.  You do have diverticulosis - thickened muscle rings and pouches in the colon wall. Please read the handout about this condition.  Next routine colonoscopy or other screening test in 10 years - 2027  I appreciate the opportunity to care for you. Gatha Mayer, MD, FACG   YOU HAD AN ENDOSCOPIC PROCEDURE TODAY AT [facility]:   Refer to the procedure report that was given to you for any specific questions about what was found during the examination.  If the procedure report does not answer your questions, please call your gastroenterologist to clarify.  If you requested that your care partner not be given the details of your procedure findings, then the procedure report has been included in a sealed envelope for you to review at your convenience later.  YOU SHOULD EXPECT: Some feelings of bloating in the abdomen. Passage of more gas than usual.  Walking can help get rid of the air that was put into your GI tract during the procedure and reduce the bloating. If you had a lower endoscopy (such as a colonoscopy or flexible sigmoidoscopy) you may notice spotting of blood in your stool or on the toilet paper. If you underwent a bowel prep for your procedure, you may not have a normal bowel movement for a few days.  Please Note:  You might notice some irritation and congestion in your nose or some drainage.  This is from the oxygen used during your procedure.  There is no need for concern and it should clear up in a day or so.  SYMPTOMS TO REPORT IMMEDIATELY:   Following lower endoscopy (colonoscopy or flexible sigmoidoscopy):  Excessive amounts of blood in the stool  Significant tenderness or worsening of abdominal pains  Swelling of the abdomen that is new, acute  Fever of 100F or higher   Following upper endoscopy (EGD)  Vomiting of blood or coffee ground material  New chest pain or pain under the shoulder blades  Painful or persistently  difficult swallowing  New shortness of breath  Fever of 100F or higher  Black, tarry-looking stools  For urgent or emergent issues, a gastroenterologist can be reached at any hour by calling [phone].   DIET:  We do recommend a small meal at first, but then you may proceed to your regular diet.  Drink plenty of fluids but you should avoid alcoholic beverages for 24 hours.  ACTIVITY:  You should plan to take it easy for the rest of today and you should NOT DRIVE or use heavy machinery until tomorrow (because of the sedation medicines used during the test).    FOLLOW UP: Our staff will call the number listed on your records the next business day following your procedure to check on you and address any questions or concerns that you may have regarding the information given to you following your procedure. If we do not reach you, we will leave a message.  However, if you are feeling well and you are not experiencing any problems, there is no need to return our call.  We will assume that you have returned to your regular daily activities without incident.  If any biopsies were taken you will be contacted by phone or by letter within the next 1-3 weeks.  Please call us at [phone] if you have not heard about the biopsies in 3 weeks.    SIGNATURES/CONFIDENTIALITY: You and/or your care partner have signed paperwork which will be  entered into your electronic medical record.  These signatures attest to the fact that that the information above on your After Visit Summary has been reviewed and is understood.  Full responsibility of the confidentiality of this discharge information lies with you and/or your care-partner.

## 2016-09-11 ENCOUNTER — Other Ambulatory Visit: Payer: Self-pay

## 2016-09-11 ENCOUNTER — Telehealth: Payer: Self-pay

## 2016-09-11 NOTE — Telephone Encounter (Signed)
  Follow up Call-  Call back number 09/10/2016  Post procedure Call Back phone  # [phone] cell  Permission to leave phone message Yes  Some recent data might be hidden     Patient questions:  Do you have a fever, pain , or abdominal swelling? No. Pain Score  0 *  Have you tolerated food without any problems? Yes.    Have you been able to return to your normal activities? Yes.    Do you have any questions about your discharge instructions: Diet   No. Medications  No. Follow up visit  No.  Do you have questions or concerns about your Care? No.  Actions: * If pain score is 4 or above: No action needed, pain <4.

## 2016-09-12 ENCOUNTER — Other Ambulatory Visit: Payer: Self-pay | Admitting: *Deleted

## 2016-09-12 MED ORDER — LEVOTHYROXINE SODIUM 88 MCG PO TABS: 88.0000 ug | ORAL_TABLET | Freq: Every day | ORAL | 0 refills | Status: DC

## 2016-10-10 ENCOUNTER — Other Ambulatory Visit: Payer: Self-pay | Admitting: Family Medicine

## 2016-12-05 ENCOUNTER — Encounter: Payer: Self-pay | Admitting: Family Medicine

## 2016-12-10 ENCOUNTER — Other Ambulatory Visit: Payer: Self-pay | Admitting: Family Medicine

## 2016-12-21 ENCOUNTER — Other Ambulatory Visit: Payer: Self-pay | Admitting: Family Medicine

## 2017-03-24 ENCOUNTER — Encounter: Payer: Self-pay | Admitting: Family Medicine

## 2017-03-26 ENCOUNTER — Other Ambulatory Visit: Payer: Self-pay

## 2017-03-26 DIAGNOSIS — M79671 Pain in right foot: Secondary | ICD-10-CM

## 2017-04-02 ENCOUNTER — Ambulatory Visit (INDEPENDENT_AMBULATORY_CARE_PROVIDER_SITE_OTHER): Payer: BLUE CROSS/BLUE SHIELD | Admitting: Sports Medicine

## 2017-04-02 ENCOUNTER — Ambulatory Visit: Payer: Self-pay

## 2017-04-02 ENCOUNTER — Encounter: Payer: Self-pay | Admitting: Sports Medicine

## 2017-04-02 VITALS — BP 110/80 | HR 66 | Ht 72.0 in | Wt 201.4 lb

## 2017-04-02 DIAGNOSIS — M79671 Pain in right foot: Secondary | ICD-10-CM | POA: Diagnosis not present

## 2017-04-02 DIAGNOSIS — M722 Plantar fascial fibromatosis: Secondary | ICD-10-CM

## 2017-04-02 NOTE — Assessment & Plan Note (Addendum)
Classic appearance of right plantar fascia for plantar fasciitis. Patient reports only moderate pain at this time so will defer injection. Continue with compression as well as with eccentric exercises, plantar fascia stretching, icing and long arch strapping/superfeet PF inserts will plan to re-ultrasound his foot in 5 weeks and if persistently swollen at that time will plan for injection prior to his upcoming trip.  Can also consider custom cushioned orthotics.  +++++++++++++++++++++++++++++++++++++++++++++++++++++++++++++++ PROCEDURE NOTE: THERAPEUTIC EXERCISES (97110) 15 minutes spent for Therapeutic exercises as stated in above notes.  This included exercises focusing on stretching, strengthening, with significant focus on eccentric aspects.   Proper technique shown and discussed handout in great detail with ATC.  All questions were discussed and answered.

## 2017-04-02 NOTE — Patient Instructions (Signed)
Please perform the exercise program that Jeneen Rinks has prepared for you and gone over in detail on a daily basis.  In addition to the handout you were provided you can access your program through: [email or web address]   Your unique program code is: [phone]

## 2017-04-02 NOTE — Progress Notes (Signed)
OFFICE VISIT NOTE Aaron Gomez, [place] [facility] [place] - 51 y.o. male MRN [id number]  Date of birth: 12/19/1965  Visit Date: 04/02/2017  PCP: Marin Olp, MD   Referred by: Marin Olp, MD  Burlene Arnt, CMA acting as scribe for Dr. Paulla Fore.  SUBJECTIVE:   Chief Complaint  Patient presents with  . pain in right foot   HPI: As below and per problem based documentation when appropriate.  Pain in right foot/heel Sx started in October Patient is a runner and does a lot of back packing and hiking. He hasn't run in 2 months to see if sx would resolve but they have not.  He does have an upcoming trip to like a [place] in 8 weeks and is interested in having this improved prior to that trip.  He has tried over-the-counter super feet  The pain is described as stabbing and is rated as 4/10.  Worsened with prolonged standing, walking, running Improves with massage, stretching gives short term relief.  Therapies tried include Ibuprofen, minimal relief Other associated symptoms include: none Pt denies fever, chills, night sweats    Review of Systems  Constitutional: Negative for chills and fever.  Respiratory: Negative for shortness of breath.   Cardiovascular: Negative for chest pain.  Gastrointestinal: Negative for constipation and diarrhea.  Musculoskeletal: Negative for falls.  Neurological: Positive for headaches (migraines). Negative for dizziness and tingling.  Endo/Heme/Allergies: Does not bruise/bleed easily.    Otherwise per HPI.  HISTORY & PERTINENT PRIOR DATA:  No specialty comments available. He reports that he quit smoking about 30 years ago. He has never used smokeless tobacco. No results for input(s): HGBA1C, LABURIC in the last 8760 hours. Medications & Allergies reviewed per EMR Patient Active Problem List   Diagnosis Date Noted  . 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  Eczema 01/11/2016  . Hypothyroidism 07/06/2015  . Snoring 02/23/2015  . 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 TINEA PEDIS 09/25/2009  . Hyperlipidemia 01/12/2009  . Allergic rhinitis 01/12/2009  . Asthma 01/12/2009  . GERD 01/12/2009  . Common migraine 01/12/2009  . NEPHROLITHIASIS, HX OF 01/12/2009   Past Medical History:  Diagnosis Date  . Allergy    venom anaphylaxis  . Asthma    extrinsic  . 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  . GERD (gastroesophageal reflux disease)   . Headache(784.0)   . Hyperlipidemia   . Hypothyroidism   . Nephrolithiasis     X 8,Dr Tannebaum   Family History  Problem Relation Age of Onset  . Heart attack Father 78       died @ 102  . Thyroid disease Mother   . Heart attack Brother 36       1/2 brother  . Crohn's disease Brother   . Hypertension Paternal Grandmother   . Heart disease Paternal Grandmother        MI in 27s  . Diabetes Paternal Grandmother   . Heart attack Paternal Grandfather 63  . Heart attack Paternal Aunt 43  . Colon cancer Neg Hx   . Esophageal cancer Neg Hx   . Pancreatic cancer Neg Hx   . Prostate cancer Neg Hx   . Rectal cancer Neg Hx   . Stomach cancer Neg Hx    Past Surgical History:  Procedure Laterality Date  . ELBOW SURGERY     Tommy Judas's  . KNEE ARTHROSCOPY  2011  R knee; Dr Maureen Ralphs  . LUMBAR FUSION  2004   Dr Trenton Gammon  . MICRODISCECTOMY LUMBAR  1999   L4-5,L5-S1;Dr. Trenton Gammon  . TONSILLECTOMY    . VASECTOMY  2000   Social History   Occupational History  . Not on file.   Social History Main Topics  . Smoking status: Former Smoker    Quit date: 11/17/1986  . Smokeless tobacco: Never Used     Comment: smoked 1980-1988, up to 1 ppd  . Alcohol use Yes     Comment: 2 drinks a years  . Drug use: No  . Sexual activity: Not on file    OBJECTIVE:  VS:  HT:6' (182.9 cm)   WT:201 lb 6.4 oz (91.4 kg)  BMI:27.4    BP:110/80  HR:66bpm  TEMP: ( )  RESP:98 % EXAM: Findings:  WDWN, NAD, Non-toxic appearing Alert &  appropriately interactive Not depressed or anxious appearing No increased work of breathing. Pupils are equal. EOM intact without nystagmus No clubbing or cyanosis of the extremities appreciated No significant rashes/lesions/ulcerations overlying the examined area. DP & PT pulses 2+/4.  No significant pretibial edema.  No clubbing or cyanosis Sensation intact to light touch in lower extremities.  Exam foot: Normal-appearing.  No significant deformity.  Moderate cavus foot. Equinus contracture to 95 on the right with straight knee. Marked TTP over the origin of the plantar fascia. No pain with palpation over the calcaneus or longitudinal arch. Intrinsic ankle strength is 5 out of 5 with good range of motion  ++++++++++++++++++++++++++++++++++++++++++++++++++++++++++++++++++ LIMITED MSK ULTRASOUND OF Bilateral Feet: Images were obtained and interpreted by myself, Teresa Coombs, DO  Images have been saved and stored to PACS system. Images obtained on: GE S7 Ultrasound machine  FINDINGS:  R PF: Markedly swollen insertion at its insertion measuring 0.7 cm.  Longitudinal arch is normal appearing. L PF: Normal appearing. 0.3cm. Normal Long arch  IMPRESSION:  Right Plantar Fasciitis      No results found. ASSESSMENT & PLAN:   Problem List Items Addressed This Visit    Plantar fascia syndrome    Classic appearance of right plantar fascia for plantar fasciitis. Patient reports only moderate pain at this time so will defer injection. Continue with compression as well as with eccentric exercises, plantar fascia stretching, icing and long arch strapping/superfeet PF inserts will plan to re-ultrasound his foot in 5 weeks and if persistently swollen at that time will plan for injection prior to his upcoming trip.  Can also consider custom cushioned orthotics.  +++++++++++++++++++++++++++++++++++++++++++++++++++++++++++++++ PROCEDURE NOTE: THERAPEUTIC EXERCISES (97110) 15 minutes spent for  Therapeutic exercises as stated in above notes.  This included exercises focusing on stretching, strengthening, with significant focus on eccentric aspects.   Proper technique shown and discussed handout in great detail with ATC.  All questions were discussed and answered.        Other Visit Diagnoses    Right foot pain    -  Primary   Relevant Orders   [place] LIMITED JOINT SPACE STRUCTURES LOW RIGHT(NO LINKED CHARGES)      Follow-up: Return in about 5 weeks (around 05/07/2017) for repeat diagnostic ultrasound.   CMA/ATC served as Education administrator during this visit. History, Physical, and Plan performed by medical provider. Documentation and orders reviewed and attested to.      Teresa Coombs, [place] [facility] Physician    04/02/2017 4:13 PM

## 2017-05-04 ENCOUNTER — Other Ambulatory Visit: Payer: Self-pay | Admitting: Family Medicine

## 2017-05-08 ENCOUNTER — Encounter: Payer: Self-pay | Admitting: Sports Medicine

## 2017-05-08 ENCOUNTER — Ambulatory Visit: Payer: Self-pay

## 2017-05-08 ENCOUNTER — Ambulatory Visit (INDEPENDENT_AMBULATORY_CARE_PROVIDER_SITE_OTHER): Payer: BLUE CROSS/BLUE SHIELD | Admitting: Sports Medicine

## 2017-05-08 VITALS — BP 130/90 | HR 55 | Ht 72.0 in | Wt 199.6 lb

## 2017-05-08 DIAGNOSIS — M722 Plantar fascial fibromatosis: Secondary | ICD-10-CM

## 2017-05-08 DIAGNOSIS — M79671 Pain in right foot: Secondary | ICD-10-CM

## 2017-05-08 NOTE — Progress Notes (Signed)
OFFICE VISIT NOTE Aaron Gomez. Aaron Gomez, Aaron Gomez at [place] - 51 y.o. male MRN [id number]  Date of birth: 1966/10/01  Visit Date: 05/08/2017  PCP: Marin Olp, MD   Referred by: Marin Olp, MD  Jari Sportsman, cma acting as scribe for Dr. Paulla Fore.  SUBJECTIVE:   Chief Complaint  Patient presents with  . Follow-up  . Right Foot Pain  . Plantar Fascia Syndrome   HPI: As below and per problem based documentation when appropriate.  Aaron Gomez reports no change in sx since last visit. He has been consistently performing the the therapeutic exercises with no relief. He feels the exercises aggrevated it more. No medication therapy. He would like a steriod injection.     ROS  Otherwise per HPI.  HISTORY & PERTINENT PRIOR DATA:  No specialty comments available. He reports that he quit smoking about 30 years ago. He has never used smokeless tobacco. No results for input(s): HGBA1C, LABURIC in the last 8760 hours. Medications & Allergies reviewed per EMR Patient Active Problem List   Diagnosis Date Noted  . 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 Eczema 01/11/2016  . Hypothyroidism 07/06/2015  . Snoring 02/23/2015  . 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 TINEA PEDIS 09/25/2009  . Hyperlipidemia 01/12/2009  . Allergic rhinitis 01/12/2009  . Asthma 01/12/2009  . GERD 01/12/2009  . Common migraine 01/12/2009  . NEPHROLITHIASIS, HX OF 01/12/2009   Past Medical History:  Diagnosis Date  . Allergy    venom anaphylaxis  . Asthma    extrinsic  . 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  . GERD (gastroesophageal reflux disease)   . Headache(784.0)   . Hyperlipidemia   . Hypothyroidism   . Nephrolithiasis     X 8,Dr Tannebaum   Family History  Problem Relation Age of Onset  . Heart attack Father 50       died @ 61  . Thyroid disease Mother   . Heart attack Brother 55       1/2 brother  . Crohn's disease Brother   .  Hypertension Paternal Grandmother   . Heart disease Paternal Grandmother        MI in 85s  . Diabetes Paternal Grandmother   . Heart attack Paternal Grandfather 30  . Heart attack Paternal Aunt 48  . Colon cancer Neg Hx   . Esophageal cancer Neg Hx   . Pancreatic cancer Neg Hx   . Prostate cancer Neg Hx   . Rectal cancer Neg Hx   . Stomach cancer Neg Hx    Past Surgical History:  Procedure Laterality Date  . ELBOW SURGERY     Aaron Gomez's  . KNEE ARTHROSCOPY  2011   R knee; Dr Maureen Ralphs  . LUMBAR FUSION  2004   Dr Trenton Gammon  . MICRODISCECTOMY LUMBAR  1999   L4-5,L5-S1;Dr. Trenton Gammon  . TONSILLECTOMY    . VASECTOMY  2000   Social History   Occupational History  . Not on file.   Social History Main Topics  . Smoking status: Former Smoker    Quit date: 11/17/1986  . Smokeless tobacco: Never Used     Comment: smoked 1980-1988, up to 1 ppd  . Alcohol use Yes     Comment: 2 drinks a years  . Drug use: No  . Sexual activity: Not on file    OBJECTIVE:  VS:  HT:6' (182.9 cm)   WT:199 lb 9.6  oz (90.5 kg)  BMI:27.1    BP:130/90  HR:(!) 55bpm  TEMP: ( )  RESP:99 % EXAM: Findings:  Marked TTP at the base of the calcaneus directly overlying plantar fascia.  Slight equinus contracture which is mild.  No significant skin changes.  DP PT pulses 2+/4  [place] LIMITED JOINT SPACE STRUCTURES LOW RIGHT(NO LINKED CHARGES) Gerda Diss, DO     06/05/2017  1:08 AM PROCEDURE NOTE -  ULTRASOUND GUIDEDINJECTION: Right plantar  fasciitis injection Images were obtained and interpreted by myself, Teresa Coombs, DO   Images have been saved and stored to PACS system. Images obtained on: GE S7 Ultrasound machine  ULTRASOUND FINDINGS: Markedly thickened plantar fascia with the  fascia measuring 0.77 cm at its greatest thickness.  DESCRIPTION OF PROCEDURE:  The patient's clinical condition is marked by substantial pain  and/or significant functional disability. Other conservative  therapy has  not provided relief, is contraindicated, or not  appropriate. There is a reasonable likelihood that injection will  significantly improve the patient's pain and/or functional  impairment. After discussing the risks, benefits and expected  outcomes of the injection and all questions were reviewed and  answered, the patient wished to undergo the above named  procedure. Verbal consent was obtained. The ultrasound was used  to identify the target structure and adjacent neurovascular  structures. The skin was then prepped in sterile fashion and the  target structure was injected under direct visualization using  sterile technique as below: PREP: Alcohol, Ethel Chloride APPROACH: direct inplane, medial approach single injection, 25g  1.5" needle INJECTATE: 1 cc 1% lidocaine, 1 cc 40mg  DepoMedrol ASPIRATE: N/A DRESSING: Band-Aid  Post procedural instructions including recommending icing and  warning signs for infection were reviewed. This procedure was  well tolerated and there were no complications.   IMPRESSION: Succesful US Guided Injection   No provider could be found using the specified search hierarchy  ASSESSMENT & PLAN:   Problem List Items Addressed This Visit    Plantar fascia syndrome    Markedly thickened, injected during visit.   Consider custom cushion orthotics at follow-up       Other Visit Diagnoses    Right foot pain    -  Primary   Relevant Orders   [place] LIMITED JOINT SPACE STRUCTURES LOW RIGHT(NO LINKED CHARGES) (Completed)      Follow-up: Return in about 8 weeks (around 07/03/2017).   CMA/ATC served as Education administrator during this visit. History, Physical, and Plan performed by medical provider. Documentation and orders reviewed and attested to.      Teresa Coombs, [place] Physician

## 2017-06-05 NOTE — Assessment & Plan Note (Signed)
Markedly thickened, injected during visit.   Consider custom cushion orthotics at follow-up

## 2017-06-05 NOTE — Procedures (Signed)
PROCEDURE NOTE -  ULTRASOUND GUIDEDINJECTION: Right plantar fasciitis injection Images were obtained and interpreted by myself, Teresa Coombs, DO  Images have been saved and stored to PACS system. Images obtained on: GE S7 Ultrasound machine  ULTRASOUND FINDINGS: Markedly thickened plantar fascia with the fascia measuring 0.77 cm at its greatest thickness.  DESCRIPTION OF PROCEDURE:  The patient's clinical condition is marked by substantial pain and/or significant functional disability. Other conservative therapy has not provided relief, is contraindicated, or not appropriate. There is a reasonable likelihood that injection will significantly improve the patient's pain and/or functional impairment. After discussing the risks, benefits and expected outcomes of the injection and all questions were reviewed and answered, the patient wished to undergo the above named procedure. Verbal consent was obtained. The ultrasound was used to identify the target structure and adjacent neurovascular structures. The skin was then prepped in sterile fashion and the target structure was injected under direct visualization using sterile technique as below: PREP: Alcohol, Ethel Chloride APPROACH: direct inplane, medial approach single injection, 25g 1.5" needle INJECTATE: 1 cc 1% lidocaine, 1 cc 40mg  DepoMedrol ASPIRATE: N/A DRESSING: Band-Aid  Post procedural instructions including recommending icing and warning signs for infection were reviewed. This procedure was well tolerated and there were no complications.   IMPRESSION: Succesful US Guided Injection

## 2017-06-18 ENCOUNTER — Other Ambulatory Visit: Payer: Self-pay | Admitting: Family Medicine

## 2017-07-03 ENCOUNTER — Encounter: Payer: Self-pay | Admitting: Sports Medicine

## 2017-07-03 ENCOUNTER — Ambulatory Visit (INDEPENDENT_AMBULATORY_CARE_PROVIDER_SITE_OTHER): Payer: BLUE CROSS/BLUE SHIELD | Admitting: Sports Medicine

## 2017-07-03 ENCOUNTER — Other Ambulatory Visit: Payer: Self-pay

## 2017-07-03 VITALS — BP 110/78 | HR 62 | Ht 72.0 in | Wt 199.4 lb

## 2017-07-03 DIAGNOSIS — E785 Hyperlipidemia, unspecified: Secondary | ICD-10-CM

## 2017-07-03 DIAGNOSIS — M722 Plantar fascial fibromatosis: Secondary | ICD-10-CM | POA: Diagnosis not present

## 2017-07-03 DIAGNOSIS — M79671 Pain in right foot: Secondary | ICD-10-CM

## 2017-07-03 DIAGNOSIS — Z0000 Encounter for general adult medical examination without abnormal findings: Secondary | ICD-10-CM

## 2017-07-03 DIAGNOSIS — E039 Hypothyroidism, unspecified: Secondary | ICD-10-CM

## 2017-07-03 DIAGNOSIS — Z125 Encounter for screening for malignant neoplasm of prostate: Secondary | ICD-10-CM

## 2017-07-03 NOTE — Assessment & Plan Note (Signed)
Overall he is markedly improved his pain is almost resolved but he does have occasional intermittent sharp pain. He did well on his recent trip with the injection but I am confident that he still has fairly moderate degree of degenerative change at the insertion/origin of the plantar fascia.  We will have him continue with the heel drops and I would like to check in with him 1 additional time in 10 weeks to ensure the plantar fascia has normalized on MSK ultrasound.

## 2017-07-03 NOTE — Progress Notes (Signed)
OFFICE VISIT NOTE Aaron Gomez. Aaron Gomez, [facility] - 51 y.o. male MRN [id number]  Date of birth: 05-06-1966  Visit Date: 07/03/2017  PCP: Aaron Olp, MD   Referred by: Aaron Olp, MD  Aaron Gomez, CMA acting as scribe for Dr. Paulla Gomez.  SUBJECTIVE:   Chief Complaint  Patient presents with  . Follow-up    rt foot pain   HPI: As below and per problem based documentation when appropriate.  [name] is an established patient presenting today in follow-up of rt foot pain. He had steroid inj 05/08/17.   Pt reports that the injection worked extremely well. He reports that it took 3-4 days for the injection to set in and he has been pain free until the past 3 days. He is having occasional "twinges" of pain in the right foot. He went to [place] and did a lot of hiking, about 12 miles per day. The twinges of pain are mostly in the heel. The pain is not constant, its a sharp pain that comes and goes.     Review of Systems  Constitutional: Negative for chills and fever.  Respiratory: Negative for shortness of breath and wheezing.   Cardiovascular: Negative for chest pain, palpitations and leg swelling.  Musculoskeletal: Negative for falls.  Neurological: Positive for headaches (migraines). Negative for dizziness and tingling.  Endo/Heme/Allergies: Does not bruise/bleed easily.    Otherwise per HPI.  HISTORY & PERTINENT PRIOR DATA:  No specialty comments available. He reports that he quit smoking about 30 years ago. He has never used smokeless tobacco. No results for input(s): HGBA1C, LABURIC in the last 8760 hours. Medications & Allergies reviewed per EMR Patient Active Problem List   Diagnosis Date Noted  . 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 Eczema 01/11/2016  . Hypothyroidism 07/06/2015  . Snoring 02/23/2015  . 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 TINEA PEDIS 09/25/2009  . Hyperlipidemia  01/12/2009  . Allergic rhinitis 01/12/2009  . Asthma 01/12/2009  . GERD 01/12/2009  . Common migraine 01/12/2009  . NEPHROLITHIASIS, HX OF 01/12/2009   Past Medical History:  Diagnosis Date  . Allergy    venom anaphylaxis  . Asthma    extrinsic  . 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  . GERD (gastroesophageal reflux disease)   . Headache(784.0)   . Hyperlipidemia   . Hypothyroidism   . Nephrolithiasis     X 8,Dr Tannebaum   Family History  Problem Relation Age of Onset  . Heart attack Father 50       died @ 67  . Thyroid disease Mother   . Heart attack Brother 46       1/2 brother  . Crohn's disease Brother   . Hypertension Paternal Grandmother   . Heart disease Paternal Grandmother        MI in 12s  . Diabetes Paternal Grandmother   . Heart attack Paternal Grandfather 47  . Heart attack Paternal Aunt 31  . Colon cancer Neg Hx   . Esophageal cancer Neg Hx   . Pancreatic cancer Neg Hx   . Prostate cancer Neg Hx   . Rectal cancer Neg Hx   . Stomach cancer Neg Hx    Past Surgical History:  Procedure Laterality Date  . ELBOW SURGERY     Aaron Gomez's  . KNEE ARTHROSCOPY  2011   R knee; Dr Aaron Gomez  . LUMBAR FUSION  2004  Dr Aaron Gomez  . MICRODISCECTOMY LUMBAR  1999   L4-5,L5-S1;Dr. Trenton Gomez  . TONSILLECTOMY    . VASECTOMY  2000   Social History   Occupational History  . Not on file.   Social History Main Topics  . Smoking status: Former Smoker    Quit date: 11/17/1986  . Smokeless tobacco: Never Used     Comment: smoked 1980-1988, up to 1 ppd  . Alcohol use Yes     Comment: 2 drinks a years  . Drug use: No  . Sexual activity: Not on file    OBJECTIVE:  VS:  HT:6' (182.9 cm)   WT:199 lb 6.4 oz (90.4 kg)  BMI:27.1    BP:110/78  HR:62bpm  TEMP: ( )  RESP:95 % EXAM: Findings:  Adult male in no acute distress alert and appropriate.  Walks with a normal gait.  Feet and ankles are overall well aligned.  No significant lower extremity edema.     No results  found. ASSESSMENT & PLAN:     ICD-10-CM   1. Right foot pain M79.671   2. Plantar fascia syndrome M72.2   ================================================================= Plantar fascia syndrome Overall he is markedly improved his pain is almost resolved but he does have occasional intermittent sharp pain. He did well on his recent trip with the injection but I am confident that he still has fairly moderate degree of degenerative change at the insertion/origin of the plantar fascia.  We will have him continue with the heel drops and I would like to check in with him 1 additional time in 10 weeks to ensure the plantar fascia has normalized on MSK ultrasound. ================================================================= Patient Instructions  I am glad you are doing as well as guarding of the trip was great.  Keep doing the heel drops on a daily basis this will be critical to keep your running.  If you are having any worsening issues as you ramp-up your training for the marathon please let me know.  Remember to soak your foot in cold water following long runs. =================================================================  Follow-up: Return in about 10 weeks (around 09/11/2017).   CMA/ATC served as Education administrator during this visit. History, Physical, and Plan performed by medical provider. Documentation and orders reviewed and attested to.      Aaron Gomez, [facility] Physician

## 2017-07-03 NOTE — Patient Instructions (Signed)
I am glad you are doing as well as guarding of the trip was great.  Keep doing the heel drops on a daily basis this will be critical to keep your running.  If you are having any worsening issues as you ramp-up your training for the marathon please let me know.  Remember to soak your foot in cold water following long runs.

## 2017-07-07 ENCOUNTER — Other Ambulatory Visit (INDEPENDENT_AMBULATORY_CARE_PROVIDER_SITE_OTHER): Payer: BLUE CROSS/BLUE SHIELD

## 2017-07-07 DIAGNOSIS — Z125 Encounter for screening for malignant neoplasm of prostate: Secondary | ICD-10-CM | POA: Diagnosis not present

## 2017-07-07 DIAGNOSIS — Z0000 Encounter for general adult medical examination without abnormal findings: Secondary | ICD-10-CM | POA: Diagnosis not present

## 2017-07-07 DIAGNOSIS — E039 Hypothyroidism, unspecified: Secondary | ICD-10-CM

## 2017-07-07 DIAGNOSIS — E785 Hyperlipidemia, unspecified: Secondary | ICD-10-CM

## 2017-07-07 LAB — COMPREHENSIVE METABOLIC PANEL
ALK PHOS: 78 U/L (ref 39–117)
ALT: 36 U/L (ref 0–53)
AST: 19 U/L (ref 0–37)
Albumin: 4.3 g/dL (ref 3.5–5.2)
BILIRUBIN TOTAL: 0.8 mg/dL (ref 0.2–1.2)
BUN: 12 mg/dL (ref 6–23)
CO2: 29 meq/L (ref 19–32)
CREATININE: 1.11 mg/dL (ref 0.40–1.50)
Calcium: 9.4 mg/dL (ref 8.4–10.5)
Chloride: 103 mEq/L (ref 96–112)
GFR: 74.25 mL/min (ref >60.00)
GLUCOSE: 106 mg/dL — AB (ref 70–99)
Potassium: 4.3 mEq/L (ref 3.5–5.1)
Sodium: 138 mEq/L (ref 135–145)
TOTAL PROTEIN: 7 g/dL (ref 6.0–8.3)

## 2017-07-07 LAB — PSA
PSA: 4.38 ng/mL — AB (ref 0.10–4.00)
PSA: 4.38

## 2017-07-07 LAB — LIPID PANEL
CHOL/HDL RATIO: 4
CHOLESTEROL: 152 mg/dL (ref 0–200)
HDL: 41.2 mg/dL (ref >39.00)
LDL Cholesterol: 89 mg/dL (ref 0–99)
NonHDL: 110.96
TRIGLYCERIDES: 112 mg/dL (ref 0.0–149.0)
VLDL: 22.4 mg/dL (ref 0.0–40.0)

## 2017-07-07 LAB — CBC
HCT: 45.4 % (ref 39.0–52.0)
Hemoglobin: 15.6 g/dL (ref 13.0–17.0)
MCHC: 34.4 g/dL (ref 30.0–36.0)
MCV: 90.4 fl (ref 78.0–100.0)
PLATELETS: 313 10*3/uL (ref 150.0–400.0)
RBC: 5.02 Mil/uL (ref 4.22–5.81)
RDW: 13 % (ref 11.5–15.5)
WBC: 5.6 10*3/uL (ref 4.0–10.5)

## 2017-07-07 LAB — TSH: TSH: 4.52 u[IU]/mL — AB (ref 0.35–4.50)

## 2017-07-10 ENCOUNTER — Encounter: Payer: Self-pay | Admitting: Family Medicine

## 2017-07-13 ENCOUNTER — Encounter: Payer: Self-pay | Admitting: Family Medicine

## 2017-07-14 ENCOUNTER — Encounter: Payer: Self-pay | Admitting: Family Medicine

## 2017-07-14 ENCOUNTER — Ambulatory Visit (INDEPENDENT_AMBULATORY_CARE_PROVIDER_SITE_OTHER): Payer: BLUE CROSS/BLUE SHIELD | Admitting: Family Medicine

## 2017-07-14 VITALS — BP 118/82 | HR 62 | Temp 98.1°F | Ht 72.0 in | Wt 201.0 lb

## 2017-07-14 DIAGNOSIS — G43009 Migraine without aura, not intractable, without status migrainosus: Secondary | ICD-10-CM | POA: Diagnosis not present

## 2017-07-14 DIAGNOSIS — Z0000 Encounter for general adult medical examination without abnormal findings: Secondary | ICD-10-CM

## 2017-07-14 DIAGNOSIS — E039 Hypothyroidism, unspecified: Secondary | ICD-10-CM | POA: Diagnosis not present

## 2017-07-14 DIAGNOSIS — E785 Hyperlipidemia, unspecified: Secondary | ICD-10-CM | POA: Diagnosis not present

## 2017-07-14 MED ORDER — LEVOTHYROXINE SODIUM 100 MCG PO TABS: 100.0000 ug | ORAL_TABLET | Freq: Every day | ORAL | 5 refills | Status: DC

## 2017-07-14 NOTE — Progress Notes (Signed)
Phone: [phone]  Subjective:  Patient presents today for their annual physical. Chief complaint-noted.   See problem oriented charting- ROS- full  review of systems was completed and negative except for: smoke smell in nose, increased migraine frequency  The following were reviewed and entered/updated in epic: Past Medical History:  Diagnosis Date  . Allergy    venom anaphylaxis  . Asthma    extrinsic  . 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  . GERD (gastroesophageal reflux disease)   . Headache(784.0)   . Hyperlipidemia   . Hypothyroidism   . Nephrolithiasis     X 8,Dr Tannebaum   Patient Active Problem List   Diagnosis Date Noted  . Hypothyroidism 07/06/2015    Priority: Medium  . 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 Hyperlipidemia 01/12/2009    Priority: Medium  . Asthma 01/12/2009    Priority: Medium  . Common migraine 01/12/2009    Priority: Medium  . Eczema 01/11/2016    Priority: Low  . TINEA PEDIS 09/25/2009    Priority: Low  . Allergic rhinitis 01/12/2009    Priority: Low  . GERD 01/12/2009    Priority: Low  . NEPHROLITHIASIS, HX OF 01/12/2009    Priority: Low  . 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 Snoring 02/23/2015   Past Surgical History:  Procedure Laterality Date  . ELBOW SURGERY     Tommy [facility]apers[facility]  . KNEE ARTHROSCOPY  2011   R knee; Dr Maureen Ralphs  . LUMBAR FUSION  2004   Dr Trenton Gammon  . MICRODISCECTOMY LUMBAR  1999   L4-5,L5-S1;Dr. Trenton Gammon  . TONSILLECTOMY    . VASECTOMY  2000    Family History  Problem Relation Age of Onset  . Heart attack Father 5       died @ 51  . Thyroid disease Mother   . Heart attack Brother 7       1/2 brother  . Crohn's disease Brother   . Hypertension Paternal Grandmother   . Heart disease Paternal Grandmother        MI in 97s  . Diabetes Paternal Grandmother   . Heart attack Paternal Grandfather 36  . Heart attack Paternal Aunt 40  . Colon cancer Neg Hx   . Esophageal cancer Neg Hx   . Pancreatic  cancer Neg Hx   . Prostate cancer Neg Hx   . Rectal cancer Neg Hx   . Stomach cancer Neg Hx     Medications- reviewed and updated Current Outpatient Prescriptions  Medication Sig Dispense Refill  . albuterol (PROAIR HFA) 108 (90 BASE) MCG/ACT inhaler Inhale 2 puffs into the lungs every 6 (six) hours as needed for wheezing or shortness of breath. Reported on 01/11/2016    . aspirin 81 MG tablet Take 81 mg by mouth daily.      [name] atorvastatin (LIPITOR) 20 MG tablet TAKE 1 TABLET (20 MG TOTAL) BY MOUTH DAILY. 90 tablet 1  . EPINEPHrine (EPIPEN) 0.3 mg/0.3 mL DEVI Inject 0.3 mg into the muscle once. Reported on 01/11/2016    . fexofenadine (ALLEGRA) 180 MG tablet Take 180 mg by mouth daily.      [name] ibuprofen (ADVIL,MOTRIN) 800 MG tablet Take 800 mg by mouth every 8 (eight) hours as needed.      [name] Ketorolac Tromethamine 15.75 MG/SPRAY SOLN Place 1 spray into the nose every 4 (four) hours as needed (migraines/ kidney stone pain.).     [name] levothyroxine (SYNTHROID, LEVOTHROID) 100 MCG tablet Take 1  tablet (100 mcg total) by mouth daily. 30 tablet 5  . oxyCODONE-acetaminophen (PERCOCET) 10-325 MG tablet Take 1 tablet by mouth every 4 (four) hours as needed for pain (migraine.). 30 tablet 0  . PROAIR RESPICLICK 623 (90 Base) MCG/ACT AEPB 2 INHALATIONS AS NEEDED EVERY 4-6 HOURS AS NEEDED  0  . ranitidine (ZANTAC) 150 MG capsule Take 150 mg by mouth 2 (two) times daily.      . rizatriptan (MAXALT-MLT) 10 MG disintegrating tablet TAKE 1 TABLET BY MOUTH EVERY DAY AS NEEDED FOR MIGRAINE. DISSOLVE IN MOUTH 12 tablet 5  . Tamsulosin HCl (FLOMAX) 0.4 MG CAPS Take 0.4 mg by mouth every morning.      No current facility-administered medications for this visit.     Allergies-reviewed and updated Allergies  Allergen Reactions  . Banana Anaphylaxis  . Bee Venom     Also venom from wasps & hornets cause anaphylaxis; he carries Epi-Pen & has Medi Alert bracelet  . Neomycin Sulfate     Anaphylactic shock  Because  of a history of documented adverse serious drug reaction;Medi Alert bracelet  is recommended  . Strawberry Extract Anaphylaxis  . Tape Anaphylaxis    adhesvie tape-rash, blisters  . Venomil Mixed Vespid  [Mixed Vespid Venom] Anaphylaxis  . Percocet [Oxycodone-Acetaminophen] Nausea Only    Social History   Social History  . Marital status: Married    Spouse name: N/A  . Number of children: N/A  . Years of education: N/A   Social History Main Topics  . Smoking status: Former Smoker    Quit date: 11/17/1986  . Smokeless tobacco: Never Used     Comment: smoked 1980-1988, up to 1 ppd  . Alcohol use Yes     Comment: 2 drinks a years  . Drug use: No  . Sexual activity: Not Asked   Other Topics Concern  . None   Social History Narrative   Married. 2 kids-son and daughter.       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 Oropharynx normal Neck: no thyromegaly CV: RRR no murmurs rubs or gallops Lungs: CTAB no crackles, wheeze, rhonchi Abdomen: soft/nontender/nondistended/normal bowel sounds. No rebound or guarding.  Ext: no edema Skin: warm, dry Neuro: grossly normal, moves all extremities, PERRLA GU:no hernia (for boyscout forms)  Rectal: deferred to urology  Assessment/Plan:  51 y.o. male presenting for annual physical.  Health Maintenance counseling: 1. Anticipatory guidance: Patient counseled regarding regular dental exams , eye exams - yearly with glasses, wearing seatbelts.  2. Risk factor reduction:  Advised patient of need for regular exercise and diet rich and fruits and vegetables to reduce risk of heart attack and  stroke. Exercise- limited by times by foot issues but loves to run. Diet-balanced- weight could be up due to thyroid.  Wt Readings from Last 3 Encounters:  07/14/17 201 lb (91.2 kg)  07/03/17 199 lb 6.4 oz (90.4 kg)  05/08/17 199 lb 9.6 oz (90.5 kg)   3. Immunizations/screenings/ancillary studies- advised fall flu shot - he will let us know when  he gets this. See shingrix comments below.  Immunization History  Administered Date(s) Administered  . Influenza,inj,Quad PF,6+ Mos 08/17/2013  . Influenza-Unspecified 11/02/2015, 08/22/2016  . Td 11/17/2001  . Tdap 02/02/2012    4. Prostate cancer screening- PSA trending up- to follow up with urology  Component Value Date   PSA 4.38 (H) 07/07/2017   PSA 3.93 07/04/2016   5. Colon cancer screening - 09/10/16 with 10 year follow up  6. Skin cancer screening- sees dermatology intermittently  Status of chronic or acute concerns   HLD- has been well controlled on atorvastatin 20mg . Also on aspirin. Strong family history- premature MI in father  Hyperglycemia- mild- trying to run but has had foot issues. Slight weight gain but could be thyroid related  Asthma- still followed by [facility]- q3 weeks. Has history of anaphylaxis with bee stings and carries epi pens. They also manage his allergic rhinitis.   Snoring- not noticeable to him recently to him but wakes wife at times. No daytime somnolence.   Eczema- Continued foot blisters intermittently but resolve on their own.   Sensitive skin- flared up with topical antifungal on skin  History shingles last year- would love to give him shingrix but our supply is not adequate enough   From mychart: "1) Migraines are on a significant uptick. Averaging more than five a month now.  2) New issue with a constant olfactory sensation of smoke - could this be related to meds I am taking?  3) Dermatological issues with both feet. Thought was athlete's foot but it is progressing.  4) PSA, TSH, and  Glucose levels on blood work. "  Common migraine Migraines- diagnosed 10 years but had since mid 94s. was 1-2 migraine a month now up to weekly for 4-5 months. Uses maxalt or ibuprofen and they at least make it tolerable- but still disabling. Discussed possible triggers (none found) and prophylactics- will hold off until thyroid adjustment. No routine change- no decreased sleep or hydration, dietary change, caffeine increase.   Also smells low level of smoke for about a month. No new supplements. I dont see a clear medication trigger. Doubt related to thyroid but will trial adjustment to see if makes difference - he will update [place] on migraine frequency and smoke smell in 6 weeks.   Hypothyroidism Hypothyroidism- maintains on levothyroxine- slightly off on levothyroxine 88 mcg- discussed option of 100 mcg and 6 week recheck- he agrees Lab Results  Component Value Date   TSH 4.52 (H) 07/07/2017     Future Appointments Date Time Provider [place]  09/11/2017 9:00 AM Gerda Diss, DO [facility] None   6 week tsh chec, 1 year physical  Orders Placed This Encounter  Procedures  . TSH    Standing Status:   Future    Standing Expiration Date:   07/14/2018    Meds ordered this encounter  Medications  . levothyroxine (SYNTHROID, LEVOTHROID) 100 MCG tablet    Sig: Take 1 tablet (100 mcg total) by mouth daily.    Dispense:  30 tablet    Refill:  5    Return precautions advised.  Garret Reddish, MD

## 2017-07-14 NOTE — Assessment & Plan Note (Signed)
Hypothyroidism- maintains on levothyroxine- slightly off on levothyroxine 88 mcg- discussed option of 100 mcg and 6 week recheck- he agrees Lab Results  Component Value Date   TSH 4.52 (H) 07/07/2017

## 2017-07-14 NOTE — Patient Instructions (Addendum)
Let us know when you get the flu shot please  Didn't mention this- but thanks for getting your colonoscopy- good for 10 years  Lab Results  Component Value Date   PSA 4.38 (H) 07/07/2017   PSA 3.93 07/04/2016  Please follow up with urology for rectal exam and reevaluation of PSA  In 6 weeks come back for labs (schedule before you leave- [facility]- if they cant schedule you today then call back on October 1st). Increase levothyroxine to 100 mcg- lets see how this affects weight, migraine frequency and smoke smell. Update me on those though mychart if you dont mind. May need to get you back into allergist or ENT to get their opinion. I dont see clear med causes or causes on labs

## 2017-07-14 NOTE — Assessment & Plan Note (Signed)
Migraines- diagnosed 10 years but had since mid 32s. was 1-2 migraine a month now up to weekly for 4-5 months. Uses maxalt or ibuprofen and they at least make it tolerable- but still disabling. Discussed possible triggers (none found) and prophylactics- will hold off until thyroid adjustment. No routine change- no decreased sleep or hydration, dietary change, caffeine increase.   Also smells low level of smoke for about a month. No new supplements. I dont see a clear medication trigger. Doubt related to thyroid but will trial adjustment to see if makes difference - he will update [place] on migraine frequency and smoke smell in 6 weeks.

## 2017-08-25 ENCOUNTER — Encounter: Payer: Self-pay | Admitting: Family Medicine

## 2017-08-25 ENCOUNTER — Ambulatory Visit (INDEPENDENT_AMBULATORY_CARE_PROVIDER_SITE_OTHER): Payer: BLUE CROSS/BLUE SHIELD | Admitting: Family Medicine

## 2017-08-25 VITALS — BP 120/84 | HR 62 | Temp 98.4°F | Ht 72.0 in | Wt 204.2 lb

## 2017-08-25 DIAGNOSIS — B9689 Other specified bacterial agents as the cause of diseases classified elsewhere: Secondary | ICD-10-CM

## 2017-08-25 DIAGNOSIS — J329 Chronic sinusitis, unspecified: Secondary | ICD-10-CM | POA: Diagnosis not present

## 2017-08-25 DIAGNOSIS — E039 Hypothyroidism, unspecified: Secondary | ICD-10-CM

## 2017-08-25 DIAGNOSIS — G43009 Migraine without aura, not intractable, without status migrainosus: Secondary | ICD-10-CM

## 2017-08-25 LAB — TSH: TSH: 3.09 u[IU]/mL (ref 0.35–4.50)

## 2017-08-25 MED ORDER — AZITHROMYCIN 250 MG PO TABS: ORAL_TABLET | ORAL | 0 refills | Status: DC

## 2017-08-25 MED ORDER — PREDNISONE 20 MG PO TABS: ORAL_TABLET | ORAL | 0 refills | Status: DC

## 2017-08-25 NOTE — Assessment & Plan Note (Signed)
S: last visit migraines were up to 5 a month and had been 1-2 a month. Higher # for 5 months at that time. Maxalt and ibuprofen helping but still disabling. No clear triggers- no routine change. Discussed prophylactics if not improved with adjusting thyroid  Was also smelling low level of smoke constantly. He states since levothyroxine adjustment both the headache frequency and smoke smell have resolved.   A/P: migraines much improved with adjusted thyroid medication= continue current medicines

## 2017-08-25 NOTE — Assessment & Plan Note (Signed)
S: increased levothyroxine to 117mcg 6 weeks ago due to slightly off TSH and other symptoms A/P: update TSH. Sinusitis- so if off again would likely repeat

## 2017-08-25 NOTE — Progress Notes (Signed)
Subjective:  Aaron Gomez is a 51 y.o. year old very pleasant male patient who presents for/with See problem oriented charting ROS- No chest pain or shortness of breath. No headache or blurry vision.  Does have sinus pain and pressure.    Past Medical History-  Patient Active Problem List   Diagnosis Date Noted  . Hypothyroidism 07/06/2015    Priority: Medium  . 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 Hyperlipidemia 01/12/2009    Priority: Medium  . Asthma 01/12/2009    Priority: Medium  . Common migraine 01/12/2009    Priority: Medium  . Eczema 01/11/2016    Priority: Low  . TINEA PEDIS 09/25/2009    Priority: Low  . Allergic rhinitis 01/12/2009    Priority: Low  . GERD 01/12/2009    Priority: Low  . NEPHROLITHIASIS, HX OF 01/12/2009    Priority: Low  . 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 Snoring 02/23/2015    Medications- reviewed and updated Current Outpatient Prescriptions  Medication Sig Dispense Refill  . albuterol (PROAIR HFA) 108 (90 BASE) MCG/ACT inhaler Inhale 2 puffs into the lungs every 6 (six) hours as needed for wheezing or shortness of breath. Reported on 01/11/2016    . aspirin 81 MG tablet Take 81 mg by mouth daily.      [name] atorvastatin (LIPITOR) 20 MG tablet TAKE 1 TABLET (20 MG TOTAL) BY MOUTH DAILY. 90 tablet 1  . EPINEPHrine (EPIPEN) 0.3 mg/0.3 mL DEVI Inject 0.3 mg into the muscle once. Reported on 01/11/2016    . fexofenadine (ALLEGRA) 180 MG tablet Take 180 mg by mouth daily.      [name] ibuprofen (ADVIL,MOTRIN) 800 MG tablet Take 800 mg by mouth every 8 (eight) hours as needed.      [name] Ketorolac Tromethamine 15.75 MG/SPRAY SOLN Place 1 spray into the nose every 4 (four) hours as needed (migraines/ kidney stone pain.).     [name] levothyroxine (SYNTHROID, LEVOTHROID) 100 MCG tablet Take 1 tablet (100 mcg total) by mouth daily. 30 tablet 5  . oxyCODONE-acetaminophen (PERCOCET) 10-325 MG tablet Take 1 tablet by mouth every 4 (four) hours as needed for pain  (migraine.). 30 tablet 0  . PROAIR RESPICLICK 093 (90 Base) MCG/ACT AEPB 2 INHALATIONS AS NEEDED EVERY 4-6 HOURS AS NEEDED  0  . ranitidine (ZANTAC) 150 MG capsule Take 150 mg by mouth 2 (two) times daily.      . rizatriptan (MAXALT-MLT) 10 MG disintegrating tablet TAKE 1 TABLET BY MOUTH EVERY DAY AS NEEDED FOR MIGRAINE. DISSOLVE IN MOUTH 12 tablet 5  . Tamsulosin HCl (FLOMAX) 0.4 MG CAPS Take 0.4 mg by mouth every morning.      No current facility-administered medications for this visit.     Objective: BP 120/84 (BP Location: Left Arm, Patient Position: Sitting, Cuff Size: Large)   Pulse 62   Temp 98.4 F (36.9 C) (Oral)   Ht 6' (1.829 m)   Wt 204 lb 3.2 oz (92.6 kg)   SpO2 96%   BMI 27.69 kg/m  Gen: NAD, resting comfortably TM normal. Pharynx mild erythema. Nares with some thick clear discharge. Frontal sinus tender to palpation CV: RRR no murmurs rubs or gallops Lungs: CTAB no crackles, wheeze, rhonchi Ext: no edema  Assessment/Plan:  Bacterial sinusitis S: 10 days of symptoms of sinus pressure, congestion, nasal discharge- mainly clear with some yellow. No shortness of breath. Thursday night (also had slight fever and body aches)with worsening symptoms from sinus pressu Using neti pot  and antihistamine. Getting slightly better but minimal improvement. Baseline allergies already on antihistamine A/P: new acute issue with medication management- azithromycin as well as low dose prednisone course for bacterial sinusitis. Follow up in 10 days if symptoms not at least 50-75% better.   Hypothyroidism S: increased levothyroxine to 136mcg 6 weeks ago due to slightly off TSH and other symptoms A/P: update TSH. Sinusitis- so if off again would likely repeat  Common migraine S: last visit migraines were up to 5 a month and had been 1-2 a month. Higher # for 5 months at that time. Maxalt and ibuprofen helping but still disabling. No clear triggers- no routine change. Discussed  prophylactics if not improved with adjusting thyroid  Was also smelling low level of smoke constantly. He states since levothyroxine adjustment both the headache frequency and smoke smell have resolved.   A/P: migraines much improved with adjusted thyroid medication= continue current medicines  Future Appointments Date Time Provider [place]  09/11/2017 9:00 AM Gerda Diss, DO [facility] None   Orders Placed This Encounter  Procedures  . TSH    Colon   Meds ordered this encounter  Medications  . azithromycin (ZITHROMAX) 250 MG tablet    Sig: Take 2 tabs on day 1, then 1 tab daily until finished    Dispense:  6 tablet    Refill:  0  . predniSONE (DELTASONE) 20 MG tablet    Sig: Take 1 tablet by mouth daily for 5 days, then 1/2 tablet daily for 2 days    Dispense:  6 tablet    Refill:  0   Return precautions advised.  Garret Reddish, MD

## 2017-08-25 NOTE — Patient Instructions (Addendum)
azithromycin as well as low dose prednisone course for bacterial sinusitis. Follow up in 10 days if symptoms not at least 50-75% better.   Let us know when you get the flu shot through [facility]

## 2017-08-31 ENCOUNTER — Encounter: Payer: Self-pay | Admitting: Family Medicine

## 2017-09-11 ENCOUNTER — Ambulatory Visit: Payer: BLUE CROSS/BLUE SHIELD | Admitting: Sports Medicine

## 2017-09-24 ENCOUNTER — Encounter: Payer: Self-pay | Admitting: Sports Medicine

## 2017-09-25 ENCOUNTER — Ambulatory Visit: Payer: BLUE CROSS/BLUE SHIELD | Admitting: Sports Medicine

## 2017-10-06 ENCOUNTER — Ambulatory Visit: Payer: BLUE CROSS/BLUE SHIELD | Admitting: Sports Medicine

## 2017-10-06 ENCOUNTER — Encounter: Payer: Self-pay | Admitting: Sports Medicine

## 2017-10-06 VITALS — BP 122/82 | HR 66 | Ht 72.0 in | Wt 201.4 lb

## 2017-10-06 DIAGNOSIS — M722 Plantar fascial fibromatosis: Secondary | ICD-10-CM | POA: Diagnosis not present

## 2017-10-06 NOTE — Progress Notes (Signed)
OFFICE VISIT NOTE Aaron Gomez. Rigby, [place] at [place] - 51 y.o. male MRN [id number]  Date of birth: Sep 08, 1966  Visit Date: 10/06/2017  PCP: Marin Olp, MD   Referred by: Marin Olp, MD  Josepha Pigg, CMA acting as scribe for Dr. Paulla Fore.  SUBJECTIVE:   Chief Complaint  Patient presents with  . Follow-up    plantar fascia RT foot   HPI: As below and per problem based documentation when appropriate.  F/u plantar fascia RT foot. Last seen 07/03/17 and advised to continue heel drops. Had steroid injection 05/08/17 which did give some relief.   Pt reports that pain has flared up again, he ran 21 miles last week. He is currently training for 1/2 marathon in 12/2017. 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 He has noticed pain in the LT foot over the past 4 weeks but its not nearly as bad at the RT foot. RT foot pain is rated about 7/10 and LT foot pain is rated about 2/10. He does wear a compression sleeve while being active. If he is running more than 10 miles he will wear the compression sleeve and compression socks. The pain seems to get worse with longer distances.     Review of Systems  Constitutional: Negative for chills, fever and malaise/fatigue.  Respiratory: Negative for shortness of breath and wheezing.   Cardiovascular: Negative for chest pain, palpitations and leg swelling.  Neurological: Positive for headaches. Negative for dizziness, tingling and weakness.    Otherwise per HPI.   HISTORY & PERTINENT PRIOR DATA:  Prior History reviewed and updated per electronic medical record. Significant history, findings, studies and interim changes include: No additional findings.  reports that he quit smoking about 30 years ago. he has never used smokeless tobacco. No results for input(s): HGBA1C, LABURIC, CREATINE in the last 8760 hours. No problems  updated.  OBJECTIVE:  VS:  HT:6' (182.9 cm)   WT:201 lb 6.4 oz (91.4 kg)  BMI:27.31    BP:122/82  HR:66bpm  TEMP: ( )  RESP:95 %  PHYSICAL EXAM: Constitutional: WDWN, Non-toxic appearing. Psychiatric: Alert & appropriately interactive. Not depressed or anxious appearing. Respiratory: No increased work of breathing. Trachea Midline Eyes: Pupils are equal. EOM intact without nystagmus. No scleral icterus  Right foot: Significant improvement in his dorsiflexion however he continues to have moderate TTP over the origin of the plantar fascia.  Moderately high arch.  DP PT pulses 2+/4.   ASSESSMENT & PLAN:   1. Plantar fascia syndrome    Plan: Overall he is somewhat better but continues to have significant pain and discomfort.  Discussed multiple strategies including custom cushion orthotics, compression throughout the day, continued therapeutic exercises and running and activity modifications. Ultimately he will look into whether or not his insurance will cover orthotics if he is interested in having therapeutic call to schedule appointment otherwise we will see him back in 10 weeks and plan to inject the plantar fascia if he is having continued symptoms given the upcoming marathon.    ++++++++++++++++++++++++++++++++++++++++++++ Follow-up: Return in about 10 weeks (around 12/15/2017).   Pertinent documentation may be included in additional procedure notes, imaging studies, problem based documentation and patient instructions. Please see these sections of the encounter for additional information regarding this visit. CMA/ATC served as Education administrator during this visit. History, Physical, and Plan performed by medical  provider. Documentation and orders reviewed and attested to.      Gerda Diss, [facility] Physician

## 2017-10-06 NOTE — Patient Instructions (Addendum)
Look into having your insurance company cover a set of custom orthotics.  The code is L3030 and there are 2 units.  You can call them  and ask if this is covered.  I am happy to do these for you at any time, you just need to let our front office schedulers know you would like an "orthotic appointment."

## 2017-10-19 DIAGNOSIS — J343 Hypertrophy of nasal turbinates: Secondary | ICD-10-CM | POA: Insufficient documentation

## 2017-10-19 DIAGNOSIS — G8929 Other chronic pain: Secondary | ICD-10-CM | POA: Insufficient documentation

## 2017-10-19 DIAGNOSIS — R519 Headache, unspecified: Secondary | ICD-10-CM | POA: Insufficient documentation

## 2017-10-19 DIAGNOSIS — J342 Deviated nasal septum: Secondary | ICD-10-CM | POA: Insufficient documentation

## 2017-11-26 ENCOUNTER — Other Ambulatory Visit: Payer: Self-pay

## 2017-11-26 MED ORDER — RIZATRIPTAN BENZOATE 10 MG PO TBDP: ORAL_TABLET | ORAL | 5 refills | Status: DC

## 2017-12-01 ENCOUNTER — Telehealth: Payer: Self-pay | Admitting: Family Medicine

## 2017-12-01 ENCOUNTER — Other Ambulatory Visit: Payer: Self-pay

## 2017-12-01 MED ORDER — ATORVASTATIN CALCIUM 20 MG PO TABS: ORAL_TABLET | ORAL | 1 refills | Status: DC

## 2017-12-01 MED ORDER — LEVOTHYROXINE SODIUM 100 MCG PO TABS: 100.0000 ug | ORAL_TABLET | Freq: Every day | ORAL | 5 refills | Status: DC

## 2017-12-01 NOTE — Telephone Encounter (Signed)
Rx refill request for Levothyroxine 100 mcg was sent to pharmacy as requested.

## 2017-12-01 NOTE — Telephone Encounter (Signed)
MEDICATION: levothyroxine (SYNTHROID, LEVOTHROID) 100 MCG tablet  PHARMACY:   [id number] - [place], [place] - [address] [phone] (Phone) [phone] (Fax)     IS THIS A 90 DAY SUPPLY : yes  IS PATIENT OUT OF MEDICATION:   IF NOT; HOW MUCH IS LEFT:   LAST APPOINTMENT DATE: @10 /09/18 NEXT APPOINTMENT DATE:  OTHER COMMENTS:    **Let patient know to contact pharmacy at the end of the day to make sure medication is ready. **  ** Please notify patient to allow 48-72 hours to process**  **Encourage patient to contact the pharmacy for refills or they can request refills through [facility]**

## 2017-12-09 ENCOUNTER — Encounter: Payer: Self-pay | Admitting: Family Medicine

## 2017-12-18 ENCOUNTER — Encounter: Payer: Self-pay | Admitting: Sports Medicine

## 2017-12-18 ENCOUNTER — Ambulatory Visit: Payer: BLUE CROSS/BLUE SHIELD | Admitting: Sports Medicine

## 2017-12-18 ENCOUNTER — Ambulatory Visit: Payer: Self-pay

## 2017-12-18 VITALS — BP 102/64 | HR 68 | Ht 72.0 in | Wt 198.0 lb

## 2017-12-18 DIAGNOSIS — M722 Plantar fascial fibromatosis: Secondary | ICD-10-CM | POA: Diagnosis not present

## 2017-12-18 NOTE — Procedures (Signed)
PROCEDURE NOTE:  Ultrasound Guided: Injection: Right plantar fascial injection Images were obtained and interpreted by myself, Teresa Coombs, DO  Images have been saved and stored to PACS system. Images obtained on: GE S7 Ultrasound machine  ULTRASOUND FINDINGS:  Markedly thickened plantar fascia measuring greater than 0.71 cm up to 0.9 cm in a slightly transverse plane.  Longitudinal arch is normal appearing.  DESCRIPTION OF PROCEDURE:  The patient's clinical condition is marked by substantial pain and/or significant functional disability. Other conservative therapy has not provided relief, is contraindicated, or not appropriate. There is a reasonable likelihood that injection will significantly improve the patient's pain and/or functional impairment.  After discussing the risks, benefits and expected outcomes of the injection and all questions were reviewed and answered, the patient wished to undergo the above named procedure. Verbal consent was obtained.  The ultrasound was used to identify the target structure and adjacent neurovascular structures. The skin was then prepped in sterile fashion and the target structure was injected under direct visualization using sterile technique as below:  PREP: Alcohol, Ethel Chloride,  APPROACH: direct, single injection, 22g 1.5in. slight fenestration of the plantar fascia was performed coming from a medial approach.  Care was taken to avoid the fat pad INJECTATE: 1cc 0.5% marcaine, 1cc 40mg /mL DepoMedrol   ASPIRATE: None   DRESSING: Band-Aid   Post procedural instructions including recommending icing and warning signs for infection were reviewed.   This procedure was well tolerated and there were no complications.    IMPRESSION: Succesful Ultrasound Guided: Injection

## 2017-12-18 NOTE — Progress Notes (Signed)
Aaron Gomez. Aaron Gomez, [place] at [place] - 52 y.o. male MRN [id number]  Date of birth: Jan 24, 1966  Visit Date: 12/18/2017  PCP: Aaron Olp, MD   Referred by: Aaron Olp, MD   Scribe for today's visit: Aaron Gomez, LAT, ATC     SUBJECTIVE:  Aaron Gomez is here for Follow-up (R plantar fasciitis) .   Notes from visit on 10/06/17 F/u plantar fascia RT foot. Last seen 07/03/17 and advised to continue heel drops. Had steroid injection 05/08/17 which did give some relief.   Pt reports that pain has flared up again, he ran 21 miles last week. He is currently training for 1/2 marathon in 12/2017. 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 He has noticed pain in the LT foot over the past 4 weeks but its not nearly as bad at the RT foot. RT foot pain is rated about 7/10 and LT foot pain is rated about 2/10. He does wear a compression sleeve while being active. If he is running more than 10 miles he will wear the compression sleeve and compression socks. The pain seems to get worse with longer distances.    Compared to the last office visit on 10/06/17, his previously described R plantar fascia symptoms show no change. Current symptoms are moderate & are radiating to the R Achille's He has been doing his Aaron Gomez's exercises and HEP on a regular basis.  Using Superfeet OTC orthotics in all his running shoes but still hasn't gotten custom orthotics.  1/2 marathon is on Feb. 10th   ROS Denies night time disturbances. Denies fevers, chills, or night sweats. Denies unexplained weight loss. Denies personal history of cancer. Denies changes in bowel or bladder habits. Denies recent unreported falls. Denies new or worsening dyspnea or wheezing. Reports headaches or dizziness. Hx of migraines. Denies numbness, tingling or weakness  In the extremities.  Denies dizziness or presyncopal  episodes Denies lower extremity edema     HISTORY & PERTINENT PRIOR DATA:  Prior History reviewed and updated per electronic medical record.  Significant history, findings, studies and interim changes include:  reports that he quit smoking about 31 years ago. he has never used smokeless tobacco. No results for input(s): HGBA1C, LABURIC, CREATINE in the last 8760 hours. No specialty comments available. Problem  Plantar Fasciitis of Right Foot    OBJECTIVE:  VS:  HT:6' (182.9 cm)   WT:198 lb (89.8 kg)  BMI:26.85    BP:102/64  HR:68bpm  TEMP: ( )  RESP:96 %   PHYSICAL EXAM: Constitutional: WDWN, Non-toxic appearing. Psychiatric: Alert & appropriately interactive.  Not depressed or anxious appearing. Respiratory: No increased work of breathing.  Trachea Midline Eyes: Pupils are equal.  EOM intact without nystagmus.  No scleral icterus  NEUROVASCULAR exam: No clubbing or cyanosis appreciated No significant venous stasis changes Capillary Refill: normal, less than 2 seconds   Right foot is normal appearing.  He has a moderately high cavus foot but this is within normal limits.  He has marked pain with palpation of the origin of the plantar fascia.  Mild pain with palpation of the longitudinal arch.  He has normal dorsiflexion plantarflexion inversion and eversion range of motion and strength.  DP PT pulses are 2+/4.  No significant overlying skin changes.   ASSESSMENT & PLAN:   1. Plantar fasciitis of right foot    ++++++++++++++++++++++++++++++++++++++++++++  Orders & Meds:  Orders Placed This Encounter  Procedures  . [place] MSK POCT ULTRASOUND   No orders of the defined types were placed in this encounter.   ++++++++++++++++++++++++++++++++++++++++++++ PLAN:   No additional findings.  Plantar fasciitis of right foot Given the moderate degree of pain that is been recalcitrant and persistent thickening ultrasound-guided injection repeated today.  We did discuss the  possibility of performing Tenex in the future and a referral to [facility] was discussed. Custom cushioned insoles also discussed and he will plan to return for these given the recalcitrant nature of his symptoms.  Aaron Gomez exercises with additional home exercises reviewed per procedure note Frequent icing Appropriate activity modifications excellent 6-week follow-up for clinical reevaluation and consideration of further advanced imaging.    Follow-up: Return in about 1 week (around 12/25/2017) for orthotics.   Pertinent documentation may be included in additional procedure notes, imaging studies, problem based documentation and patient instructions. Please see these sections of the encounter for additional information regarding this visit. CMA/ATC served as Education administrator during this visit. History, Physical, and Plan performed by medical provider. Documentation and orders reviewed and attested to.      Aaron Gomez, [place] Physician

## 2017-12-18 NOTE — Patient Instructions (Addendum)
You had an injection today.  Things to be aware of after injection are listed below: . You may experience no significant improvement or even a slight worsening in your symptoms during the first 24 to 48 hours.  After that we expect your symptoms to improve gradually over the next 2 weeks for the medicine to have its maximal effect.  You should continue to have improvement out to 6 weeks after your injection. . Dr. Paulla Fore recommends icing the site of the injection for 20 minutes  1-2 times the day of your injection . You may shower but no swimming, tub bath or Jacuzzi for 24 hours. . If your bandage falls off this does not need to be replaced.  It is appropriate to remove the bandage after 4 hours. . You may resume light activities as tolerated unless otherwise directed per Dr. Paulla Fore during your visit  POSSIBLE STEROID SIDE EFFECTS:  Side effects from injectable steroids tend to be less than when taken orally however you may experience some of the symptoms listed below.  If experienced these should only last for a short period of time. Change in menstrual flow  Edema (swelling)  Increased appetite Skin flushing (redness)  Skin rash/acne  Thrush (oral) Yeast vaginitis    Increased sweating  Depression Increased blood glucose levels Cramping and leg/calf  Euphoria (feeling happy)  POSSIBLE PROCEDURE SIDE EFFECTS: The side effects of the injection are usually fairly minimal however if you may experience some of the following side effects that are usually self-limited and will is off on their own.  If you are concerned please feel free to call the office with questions:  Increased numbness or tingling  Nausea or vomiting  Swelling or bruising at the injection site   Please call our office if if you experience any of the following symptoms over the next week as these can be signs of infection:   Fever greater than 100.28F  Significant swelling at the injection site  Significant redness or drainage  from the injection site  If after 2 weeks you are continuing to have worsening symptoms please call our office to discuss what the next appropriate actions should be including the potential for a return office visit or other diagnostic testing.   Look into getting a [place] sock for sleeping in.  We have given you some info on a separate handout about these.

## 2017-12-21 ENCOUNTER — Ambulatory Visit: Payer: BLUE CROSS/BLUE SHIELD | Admitting: Sports Medicine

## 2017-12-21 ENCOUNTER — Encounter: Payer: Self-pay | Admitting: Sports Medicine

## 2017-12-21 VITALS — BP 112/80 | HR 48 | Ht 72.0 in | Wt 206.6 lb

## 2017-12-21 DIAGNOSIS — R269 Unspecified abnormalities of gait and mobility: Secondary | ICD-10-CM

## 2017-12-21 DIAGNOSIS — M722 Plantar fascial fibromatosis: Secondary | ICD-10-CM | POA: Diagnosis not present

## 2017-12-21 DIAGNOSIS — M79672 Pain in left foot: Secondary | ICD-10-CM

## 2017-12-21 NOTE — Progress Notes (Signed)
  [name]. Rigby, [place] at [place] - 52 y.o. male MRN [id number]  Date of birth: 1966/01/27  Visit Date: 12/21/2017  PCP: Marin Olp, MD   Referred by: Marin Olp, MD   Scribe for today's visit: Josepha Pigg, CMA     SUBJECTIVE:  Posey Rea is here for No chief complaint on file.  Compared to the last office visit, his previously described symptoms show no change  Current symptoms are moderate & are radiating to RT achilles He has been doing [place] and HEP exercises. He has also been wearing OTC insoles.     ROS Denies night time disturbances. Denies fevers, chills, or night sweats. Denies unexplained weight loss. Denies personal history of cancer. Denies changes in bowel or bladder habits. Denies recent unreported falls. Denies new or worsening dyspnea or wheezing. Reports headaches or dizziness.  Denies numbness, tingling or weakness  In the extremities.  Denies dizziness or presyncopal episodes Denies lower extremity edema     HISTORY & PERTINENT PRIOR DATA:  Prior History reviewed and updated per electronic medical record.  Significant history, findings, studies and interim changes include:  reports that he quit smoking about 31 years ago. he has never used smokeless tobacco. No results for input(s): HGBA1C, LABURIC, CREATINE in the last 8760 hours. No specialty comments available. No problems updated.  OBJECTIVE:  VS:  HT:6' (182.9 cm)   WT:206 lb 9.6 oz (93.7 kg)  BMI:28.01    BP:112/80  HR:(!) 48bpm  TEMP: ( )  RESP:98 %   PHYSICAL EXAM: High cavus foot.  Normal gait. TTP over the origin of the plantar fascia.   ASSESSMENT & PLAN:   1. Plantar fascia syndrome   2. Left foot pain   3. Abnormal gait    PLAN: Custom cushioned orthotics fabricated today.  ++++++++++++++++++++++++++++++++++++++++++++ Orders & Meds: Orders Placed This Encounter    Procedures  . Misc procedure    No orders of the defined types were placed in this encounter.   ++++++++++++++++++++++++++++++++++++++++++++ Follow-up: Follow-up as needed and after consultation with Dr. Joni Fears at [facility]  Pertinent documentation may be included in additional procedure notes, imaging studies, problem based documentation and patient instructions. Please see these sections of the encounter for additional information regarding this visit. CMA/ATC served as Education administrator during this visit. History, Physical, and Plan performed by medical provider. Documentation and orders reviewed and attested to.      Gerda Diss, [facility] Physician

## 2017-12-21 NOTE — Procedures (Signed)
PROCEDURE: CUSTOM ORTHOTIC FABRICATION Patient's underlying musculoskeletal conditions are directly related to poor biomechanics and will benefit from a functional custom orthotic.  There are no significant foot deformities that complicate the use of a custom orthotic.  The patient was fitted for a standard, cushioned, semi-rigid orthotic. The orthotic was heated & placed on the orthotic stand. The patient was positioned in subtalar neutral position and 10 of ankle dorsiflexion and weight bearing stance on the heated orthotic blank. After completion of the molding a base was applied to the orthotic blank. The orthotic was ground to a stable position for weightbearing. The patient ambulated in these and reported they were comfortable without pressure spots.              BLANK:  Size 11 - Standard Cushioned                 BASE:  Blue EVA      POSTINGS:  n/a

## 2018-01-17 ENCOUNTER — Encounter: Payer: Self-pay | Admitting: Sports Medicine

## 2018-01-17 DIAGNOSIS — M722 Plantar fascial fibromatosis: Secondary | ICD-10-CM | POA: Insufficient documentation

## 2018-01-17 NOTE — Assessment & Plan Note (Signed)
Given the moderate degree of pain that is been recalcitrant and persistent thickening ultrasound-guided injection repeated today.  We did discuss the possibility of performing Tenex in the future and a referral to [facility] was discussed. Custom cushioned insoles also discussed and he will plan to return for these given the recalcitrant nature of his symptoms.  Alfredson exercises with additional home exercises reviewed per procedure note Frequent icing Appropriate activity modifications excellent 6-week follow-up for clinical reevaluation and consideration of further advanced imaging.

## 2018-01-22 ENCOUNTER — Encounter: Payer: Self-pay | Admitting: Radiation Oncology

## 2018-01-22 LAB — PSA: PSA: 5.82

## 2018-01-25 ENCOUNTER — Encounter: Payer: Self-pay | Admitting: Family Medicine

## 2018-01-25 LAB — LIPID PANEL
CHOLESTEROL: 175 (ref 0–200)
HDL: 46 (ref 35–70)
LDL Cholesterol: 99
Triglycerides: 148 (ref 40–160)

## 2018-01-25 LAB — BASIC METABOLIC PANEL: GLUCOSE: 102

## 2018-01-26 ENCOUNTER — Encounter: Payer: Self-pay | Admitting: Radiation Oncology

## 2018-01-26 ENCOUNTER — Encounter: Payer: Self-pay | Admitting: Family Medicine

## 2018-01-26 DIAGNOSIS — C61 Malignant neoplasm of prostate: Secondary | ICD-10-CM

## 2018-01-26 DIAGNOSIS — Z8546 Personal history of malignant neoplasm of prostate: Secondary | ICD-10-CM | POA: Insufficient documentation

## 2018-01-26 NOTE — Progress Notes (Signed)
GU Location of Tumor / Histology: prostatic adenocarcinoma  If Prostate Cancer, Gleason Score is (4 + 3) and PSA is (4.38) on 06/2017. Prostate volume: 28 grams  Posey Rea was a patient of Dr. Gaynelle Arabian before he retired. Dr. Gaynelle Arabian treatment him for kidney stones. Patient reports he has passed a total of 17 kidney stones. 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 Reports his PCP performed routine PSA checks. Reports his PSA has been marginally elevated for 2 years. Reports his PCP referred him back to [facility] for further evaluation of an elevated PSA and he was assigned to Dr. Lovena Neighbours.  Biopsies of prostate (if applicable) revealed:    Past/Anticipated interventions by urology, if any: management of kidney stones, prostate biopsy, CT abd/pelvis/referral to Dr. Tammi Klippel to discuss radiation therapy options, referral to Dr. Alinda Money to discuss surgical options  Past/Anticipated interventions by medical oncology, if any: no  Weight changes, if any: no  Bowel/Bladder complaints, if any: IPSS 0. Denies dysuria, hematuria, leakage or incontinence.   Nausea/Vomiting, if any: no  Pain issues, if any:  no  SAFETY ISSUES:  Prior radiation? no  Pacemaker/ICD? no  Possible current pregnancy? no  Is the patient on methotrexate? no  Current Complaints / other details:  52 year old male. Married. Engineer, structural. Reports his sister was diagnosed with breast cancer two day before he was diagnosed with prostate ca. Reports he is not close with his mother's side of the family. Reports his paternal side of the family passed from heart related complications.

## 2018-01-28 ENCOUNTER — Ambulatory Visit
Admission: RE | Admit: 2018-01-28 | Discharge: 2018-01-28 | Disposition: A | Discharge status: Home / Self Care | Payer: BLUE CROSS/BLUE SHIELD | Source: Ambulatory Visit | Attending: Radiation Oncology | Admitting: Radiation Oncology

## 2018-01-28 ENCOUNTER — Encounter: Payer: Self-pay | Admitting: Medical Oncology

## 2018-01-28 ENCOUNTER — Other Ambulatory Visit: Payer: Self-pay

## 2018-01-28 ENCOUNTER — Encounter: Payer: Self-pay | Admitting: Radiation Oncology

## 2018-01-28 VITALS — BP 124/81 | HR 66 | Resp 18 | Ht 72.0 in | Wt 199.2 lb

## 2018-01-28 DIAGNOSIS — K219 Gastro-esophageal reflux disease without esophagitis: Secondary | ICD-10-CM | POA: Insufficient documentation

## 2018-01-28 DIAGNOSIS — Z885 Allergy status to narcotic agent status: Secondary | ICD-10-CM | POA: Diagnosis not present

## 2018-01-28 DIAGNOSIS — Z803 Family history of malignant neoplasm of breast: Secondary | ICD-10-CM | POA: Insufficient documentation

## 2018-01-28 DIAGNOSIS — E039 Hypothyroidism, unspecified: Secondary | ICD-10-CM | POA: Diagnosis not present

## 2018-01-28 DIAGNOSIS — Z87442 Personal history of urinary calculi: Secondary | ICD-10-CM | POA: Insufficient documentation

## 2018-01-28 DIAGNOSIS — C61 Malignant neoplasm of prostate: Secondary | ICD-10-CM | POA: Diagnosis present

## 2018-01-28 DIAGNOSIS — Z79899 Other long term (current) drug therapy: Secondary | ICD-10-CM | POA: Diagnosis not present

## 2018-01-28 DIAGNOSIS — Z8249 Family history of ischemic heart disease and other diseases of the circulatory system: Secondary | ICD-10-CM | POA: Diagnosis not present

## 2018-01-28 DIAGNOSIS — Z79890 Hormone replacement therapy (postmenopausal): Secondary | ICD-10-CM | POA: Diagnosis not present

## 2018-01-28 DIAGNOSIS — E785 Hyperlipidemia, unspecified: Secondary | ICD-10-CM | POA: Insufficient documentation

## 2018-01-28 DIAGNOSIS — J45909 Unspecified asthma, uncomplicated: Secondary | ICD-10-CM | POA: Insufficient documentation

## 2018-01-28 DIAGNOSIS — Z8042 Family history of malignant neoplasm of prostate: Secondary | ICD-10-CM | POA: Diagnosis not present

## 2018-01-28 DIAGNOSIS — Z888 Allergy status to other drugs, medicaments and biological substances status: Secondary | ICD-10-CM | POA: Diagnosis not present

## 2018-01-28 DIAGNOSIS — Z87891 Personal history of nicotine dependence: Secondary | ICD-10-CM | POA: Insufficient documentation

## 2018-01-28 DIAGNOSIS — Z7982 Long term (current) use of aspirin: Secondary | ICD-10-CM | POA: Diagnosis not present

## 2018-01-28 HISTORY — DX: Malignant neoplasm of prostate: C61

## 2018-01-28 NOTE — Progress Notes (Signed)
See progress note under physician encounter. 

## 2018-01-28 NOTE — Progress Notes (Signed)
Introduced myself to patient and wife as the prostate nurse navigator and my role. He was followed by Dr. Gaynelle Arabian for kidney stones and PSA. After Dr. Arlyn Leak retirement he has been followed by his primary care. He was referred back to urology and had a biopsy 01/11/18 that showed cancer. He is here today to discuss his radiation options and has an appointment with Dr. Alinda Money 4/9 to discuss surgery. I gave him my business card and asked them to call with questions or concerns.

## 2018-01-28 NOTE — Progress Notes (Signed)
Radiation Oncology         [phone] ________________________________  Initial Radiation Oncology Consultation  Name: Aaron Gomez MRN: [id number]  Date: 01/28/2018  DOB: 08/17/1966  JI:RCVELF, Brayton Mars, MD  Davis Gourd*   REFERRING PHYSICIAN: Davis Gourd*  DIAGNOSIS: 52 y.o. gentleman with stage T1c adenocarcinoma of the prostate with a Gleason's score of 4+3 and a PSA of 4.38.    ICD-10-CM   1. Malignant neoplasm of prostate ([place]) Aaron Gomez is a 53 y.o. gentleman, well established with Urology, previously followed with Dr. Gaynelle Arabian for history of renal stones.  He was noted to have an elevated PSA of 4.38 by his primary care physician, Dr. Yong Channel.  Previous PSA had been 3.93 in 06/2016.  Accordingly, he was referred for evaluation in urology by Dr. Lovena Neighbours on 12/04/17,  digital rectal examination was performed at that time revealing symmetrical prostate lobes without any discrete nodularity.  The patient proceeded to transrectal ultrasound with 12 biopsies of the prostate on 01/11/18.  The prostate volume measured 28 cc.  Out of 12 core biopsies,3 were positive.  The maximum Gleason score was 4+3=7, and this was seen in left mid lateral and left base.  Additionally, there was Gleason 3+4 disease in the left apex lateral.  CT A/P for disease staging was performed on 01/22/2018 and did not show any evidence of metastatic disease.  The patient reviewed the biopsy results with his urologist and he has kindly been referred today for discussion of potential radiation treatment options and clinical evaluation.  PSA 06/2017- 4.38     06/17/16 - 3.93       PREVIOUS RADIATION THERAPY: No  PAST MEDICAL HISTORY:  has a past medical history of Allergy, Asthma, Cellulitis, GERD (gastroesophageal reflux disease), Headache(784.0), Hyperlipidemia, Hypothyroidism, Nephrolithiasis, and Prostate cancer ([place]).    PAST SURGICAL  HISTORY: Past Surgical History:  Procedure Laterality Date  . ELBOW SURGERY     Tommy Shakur's  . KNEE ARTHROSCOPY  2011   R knee; Dr Maureen Ralphs  . LUMBAR FUSION  2004   Dr Trenton Gammon  . MICRODISCECTOMY LUMBAR  1999   L4-5,L5-S1;Dr. Trenton Gammon  . TONSILLECTOMY    . VASECTOMY  2000    FAMILY HISTORY: family history includes Breast cancer in his sister; Crohn's disease in his brother; Diabetes in his paternal grandmother; Heart attack (age of onset: 28) in his brother; Heart attack (age of onset: 65) in his father; Heart attack (age of onset: 19) in his paternal aunt and paternal grandfather; Heart disease in his paternal grandmother; Hypertension in his paternal grandmother; Thyroid disease in his mother.  SOCIAL HISTORY:  reports that he quit smoking about 31 years ago. His smoking use included cigarettes. He has a 1.50 pack-year smoking history. he has never used smokeless tobacco. He reports that he drinks alcohol. He reports that he does not use drugs.  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  He is married and lives in [place] with his wife.  He has 2 grown daughters that are both away in college.  ALLERGIES: Banana; Bee venom; Neomycin sulfate; Strawberry extract; Tape; Venomil mixed vespid  [mixed vespid venom]; and Percocet [oxycodone-acetaminophen]  MEDICATIONS:  Current Outpatient Medications  Medication Sig Dispense Refill  . Albuterol Sulfate 108 (90 Base) MCG/ACT AEPB 2 INHALATIONS AS NEEDED EVERY 4-6 HOURS AS NEEDED    . aspirin 81 MG tablet Take 81 mg  by mouth daily.      [name] atorvastatin (LIPITOR) 20 MG tablet TAKE 1 TABLET (20 MG TOTAL) BY MOUTH DAILY. 90 tablet 1  . fexofenadine (ALLEGRA) 180 MG tablet Take by mouth.    . fluticasone (FLONASE) 50 MCG/ACT nasal spray Place into the nose.    . ibuprofen (ADVIL,MOTRIN) 800 MG tablet Take by mouth.    . Ketorolac Tromethamine 15.75 MG/SPRAY SOLN Place 1 spray into the nose every 4 (four) hours as  needed (migraines/ kidney stone pain.).     [name] levothyroxine (SYNTHROID, LEVOTHROID) 100 MCG tablet Take 1 tablet (100 mcg total) by mouth daily. 30 tablet 5  . oxyCODONE-acetaminophen (PERCOCET) 10-325 MG tablet Take 1 tablet by mouth every 4 (four) hours as needed for pain (migraine.). 30 tablet 0  . ranitidine (ZANTAC) 150 MG capsule Take 150 mg by mouth 2 (two) times daily.      . rizatriptan (MAXALT-MLT) 10 MG disintegrating tablet TAKE 1 TABLET BY MOUTH EVERY DAY AS NEEDED FOR MIGRAINE. DISSOLVE IN MOUTH 12 tablet 5  . Tamsulosin HCl (FLOMAX) 0.4 MG CAPS Take 0.4 mg by mouth every morning.      No current facility-administered medications for this encounter.     REVIEW OF SYSTEMS:  On rev the Iew of systems, the patient reports that he is doing well overall.  He denies any chest pain, shortness of breath, cough, fevers, chills, night sweats, unintended weight changes.  He denies any bowel or bladder disturbances, and denies abdominal pain, nausea or vomiting.  He denies any new musculoskeletal or joint aches or pains, new skin lesions or concerns. The patient completed an IPSS and IIEF questionnaire.  His IPSS score was 0 indicating no urinary outflow obstructive symptoms.  He indicated that he is able to complete sexual activity without difficulties.  A complete review of systems is obtained and is otherwise negative.   PHYSICAL EXAM:  height is 6' (1.829 m) and weight is 199 lb 3.2 oz (90.4 kg). His blood pressure is 124/81 and his pulse is 66. His respiration is 18 and oxygen saturation is 100%.   In general this is a well appearing Caucasian male in no acute distress.  He is alert and oriented x4 and appropriate throughout the examination. HEENT reveals that the patient is normocephalic, atraumatic. EOMs are intact. PERRLA. Skin is intact without any evidence of gross lesions. Cardiovascular exam reveals a regular rate and rhythm, no clicks rubs or murmurs are auscultated. Chest is clear to  auscultation bilaterally. Lymphatic assessment is performed and does not reveal any adenopathy in the cervical, supraclavicular, axillary, or inguinal chains. Abdomen has active bowel sounds in all quadrants and is intact. The abdomen is soft, non tender, non distended. Lower extremities are negative for pretibial pitting edema, deep calf tenderness, cyanosis or clubbing.  KPS = 100  100 - Normal; no complaints; no evidence of disease. 90   - Able to carry on normal activity; minor signs or symptoms of disease. 80   - Normal activity with effort; some signs or symptoms of disease. 17   - Cares for self; unable to carry on normal activity or to do active work. 60   - Requires occasional assistance, but is able to care for most of his personal needs. 50   - Requires considerable assistance and frequent medical care. 42   - Disabled; requires special care and assistance. 54   - Severely disabled; hospital admission is indicated although death not imminent. 20   -  Very sick; hospital admission necessary; active supportive treatment necessary. 10   - Moribund; fatal processes progressing rapidly. 0     - Dead  Karnofsky DA, Abelmann [place], Craver LS and Burchenal [facility] [phone]) The use of the nitrogen mustards in the palliative treatment of carcinoma: with particular reference to bronchogenic carcinoma Cancer 1 634-56   LABORATORY DATA:  Lab Results  Component Value Date   WBC 5.6 07/07/2017   HGB 15.6 07/07/2017   HCT 45.4 07/07/2017   MCV 90.4 07/07/2017   PLT 313.0 07/07/2017   Lab Results  Component Value Date   NA 138 07/07/2017   K 4.3 07/07/2017   CL 103 07/07/2017   CO2 29 07/07/2017   Lab Results  Component Value Date   ALT 36 07/07/2017   AST 19 07/07/2017   ALKPHOS 78 07/07/2017   BILITOT 0.8 07/07/2017     RADIOGRAPHY: No results found.    IMPRESSION/ PLAN: 52 y.o. gentleman with stage T1c intermediate adenocarcinoma of the prostate with a Gleason's score of 4+3 and a PSA  of 4.38.  His T-Stage, Gleason's Score, and PSA put him into the intermediate risk group.  Accordingly he is eligible for a variety of potential treatment options including radiation treatment, prostate brachytherapy and prostatectomy.   Today we reviewed the findings and workup thus far.  We discussed the natural history of prostate cancer.  We reviewed the the implications of T-stage, Gleason's Score, and PSA on decision-making and outcomes in prostate cancer.  We discussed radiation treatment in the management of prostate cancer with regard to the logistics and delivery of external beam radiation treatment as well as the logistics and delivery of prostate brachytherapy.  We compared and contrasted each of these approaches and also compared these against prostatectomy.  The patient was encouraged to ask questions which we were able to answer to his stated satisfaction.   At the end of our conversation the patient remains undecided regarding his preferred treatment option but appears to be leaning towards robot-assisted laparoscopic radical prostatectomy.  We agree that this is a very reasonable treatment option for him.  We did briefly discuss the potential role of postoperative radiotherapy in the setting of adverse surgical pathology and/or a rising PSA postoperatively.  He appears to have an excellent understanding of his disease and the treatment options.  He has a consult visit scheduled with Dr. Alinda Money on April 9th, 2019 and anticipates making a final treatment decision thereafter. We enjoyed meeting with him and his wife today, and would be happy to continue to participate in the care of this very nice gentleman should he elect to proceed with radiotherapy.    2. Family history of prostate and breast cancer.  The patient was counseled regarding the possibility of a familial predisposition for cancers given his family history of prostate and breast cancers.  After further discussion regarding the  potential benefits to himself, his siblings, his children and future generations, he is interested in proceeding with genetic counseling/testing. A referral will be made to one of our genetic counselors here in the cancer center for further assessment.  We spent 60 minutes face to face with the patient and more than 50% of that time was spent in counseling and/or coordination of care. ------------------------------------------------   Nicholos Johns, PA-C    Tyler Pita, MD  [place]: [phone]  Fax: [phone] [email or web address]  Skype  [email or web address]    This document serves as a record of  services personally performed by Tyler Pita, MD and Ashlyn Bruning PA-C. It was created on their behalf by Delton Coombes, a trained medical scribe. The creation of this record is based on the scribe's personal observations and the provider's statements to them.

## 2018-01-29 ENCOUNTER — Telehealth: Payer: Self-pay | Admitting: *Deleted

## 2018-01-29 NOTE — Telephone Encounter (Signed)
Called patient to inform of genetics appt. for 02-16-18 - arrival time - 10:30 am with counsler- Roma Kayser, spoke with patient and he is aware of this appt.

## 2018-02-05 ENCOUNTER — Encounter: Payer: Self-pay | Admitting: Family Medicine

## 2018-02-16 ENCOUNTER — Inpatient Hospital Stay: Payer: BLUE CROSS/BLUE SHIELD | Attending: Radiation Oncology | Admitting: Genetics

## 2018-02-16 ENCOUNTER — Inpatient Hospital Stay: Payer: BLUE CROSS/BLUE SHIELD

## 2018-02-16 DIAGNOSIS — Z803 Family history of malignant neoplasm of breast: Secondary | ICD-10-CM

## 2018-02-16 DIAGNOSIS — Z808 Family history of malignant neoplasm of other organs or systems: Secondary | ICD-10-CM

## 2018-02-16 DIAGNOSIS — C61 Malignant neoplasm of prostate: Secondary | ICD-10-CM

## 2018-02-16 DIAGNOSIS — Z1379 Encounter for other screening for genetic and chromosomal anomalies: Secondary | ICD-10-CM

## 2018-02-17 ENCOUNTER — Encounter: Payer: Self-pay | Admitting: Genetics

## 2018-02-17 DIAGNOSIS — Z808 Family history of malignant neoplasm of other organs or systems: Secondary | ICD-10-CM | POA: Insufficient documentation

## 2018-02-17 DIAGNOSIS — Z803 Family history of malignant neoplasm of breast: Secondary | ICD-10-CM | POA: Insufficient documentation

## 2018-02-17 NOTE — Progress Notes (Signed)
REFERRING PROVIDER: Freeman Caldron, PA-C [address], [place] [address]  PRIMARY PROVIDER:  Marin Olp, MD  PRIMARY REASON FOR VISIT:  1. Prostate cancer ([place])   2. Family history of breast cancer   3. Family history of melanoma   4. Family history of parotid cancer     HISTORY OF PRESENT ILLNESS:   [name], a 52 y.o. male, was seen for a [facility] cancer genetics consultation at the request of Dr. Vincent Gros due to a personal and family history of cancer.  [name] presents to clinic today to discuss the possibility of a hereditary predisposition to cancer, genetic testing, and to further clarify his future cancer risks, as well as potential cancer risks for family members.   Prostate biopsy on 01/11/2018, at the age of 64, [name] was diagnosed with revealed prostate adenocarcinoma maximum gleason 4+3=7.  He expressed that he currently is leaning towards surgical treatment options, and has had a consult with radiation.    Colonoscopy: yes; 2009- no polyps reported, told to come back in 10 yrs.    Past Medical History:  Diagnosis Date  . Allergy    venom anaphylaxis  . Asthma    extrinsic  . 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  . Family history of breast cancer   . Family history of melanoma   . Family history of parotid cancer   . GERD (gastroesophageal reflux disease)   . Headache(784.0)   . Hyperlipidemia   . Hypothyroidism   . Nephrolithiasis     X 8,Dr Tannebaum  . Prostate cancer [facility])     Past Surgical History:  Procedure Laterality Date  . ELBOW SURGERY     Tommy Kallon's  . KNEE ARTHROSCOPY  2011   R knee; Dr Maureen Ralphs  . LUMBAR FUSION  2004   Dr Trenton Gammon  . MICRODISCECTOMY LUMBAR  1999   L4-5,L5-S1;Dr. Trenton Gammon  . TONSILLECTOMY    . VASECTOMY  2000    Social History   Socioeconomic History  . Marital status: Married    Spouse name: Not on file  . Number of children: Not on file  . Years of education: Not on file  . Highest education level:  Not on file  Occupational History  . Not on file  Social Needs  . Financial resource strain: Not on file  . Food insecurity:    Worry: Not on file    Inability: Not on file  . Transportation needs:    Medical: Not on file    Non-medical: Not on file  Tobacco Use  . Smoking status: Former Smoker    Packs/day: 0.50    Years: 3.00    Pack years: 1.50    Types: Cigarettes    Last attempt to quit: 11/17/1986    Years since quitting: 31.2  . Smokeless tobacco: Never Used  . Tobacco comment: smoked 1980-1988, up to 1 ppd  Substance and Sexual Activity  . Alcohol use: Yes    Comment: 2 drinks a years  . Drug use: No  . Sexual activity: Yes  Lifestyle  . Physical activity:    Days per week: Not on file    Minutes per session: Not on file  . Stress: Not on file  Relationships  . Social connections:    Talks on phone: Not on file    Gets together: Not on file    Attends religious service: Not on file    Active member of club  or organization: Not on file    Attends meetings of clubs or organizations: Not on file    Relationship status: Not on file  Other Topics Concern  . Not on file  Social History Narrative   Married. 2 kids-son and daughter.       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  Significant diagnoses are listed below:  Family History  Problem Relation Age of Onset  . Heart attack Father 23       died @ 78  . Thyroid disease Mother   . Heart attack Brother 42       1/2 brother  . Crohn's disease Brother   . Hypertension Paternal Grandmother   . Heart disease Paternal Grandmother        MI in 28s  . Diabetes Paternal Grandmother   . Heart attack Paternal Grandfather 82  . Heart attack Paternal Aunt 59  . Breast cancer Sister 90  . Melanoma Maternal Aunt   . Cancer Maternal Grandfather        parotid mixed  tumor- met lung  . Colon cancer Neg Hx   . Esophageal cancer Neg Hx   . Pancreatic cancer Neg Hx   . Prostate cancer Neg Hx   . Rectal cancer Neg Hx   . Stomach cancer Neg Hx     Mr. Sasaki has a 17 year-old son and a 90 year-old daughter with no history of cancer. Mr. Bartoszek has 1 full sister who was recently diagnosed with breast cancer at 41.  She has not had any genetic testing.  Mr. Dusza has a full brother who is 78 with no history of cancer. Mr. Suriano has a maternal half-brother who is 53 and has a history of chron's disease.  He has a 60 year-old son.  Mr. Inks has a paternal half brother who is 35 with no history of cancer and no children.   Mr. Bromell mother is 40 with no history of cancer.  She has thyroid disease.  MR. Azevedo's mother has 3 half brothers and 1 half sister.  (he is unsure if they are his mother's maternal or paternal half-siblings). He has limited information about these relatives, but estimates them to be in their 56's, and that his half-aunt had melanoma.   Mr. Coulthard maternal grandfather had cancer of the parotid gland - mixed tumor that metastasized to the lung.  This grandfather had 6 sisters and a brother with no history of cancer.  Mr. Czajka maternal grandmother died at 78 with no history of cancer.   Mr. Mapel father had a heart attack at 73 and died at 33.  Mr. Spinnato has 1 paternal uncle who died as a young child, and a paternal aunt who died at 11 due to a heart attack.  This aunt may have also had cancer- hx smoking.  This aunt has 3 daughters, but Mr. Riches does not know any information about these relatives' health.  Mr. Eddings paternal grandfather died of a heart attack at 92, and his paternal grandmother died at 41 and had diabetes.  This grandmother had maternal half-brother with cancer, and a maternal half sister who died a 63 and maybe had cancer.   Mr. Skoda is unaware of previous family history of genetic testing for hereditary cancer risks.  Patient's  maternal ancestors are of American/German descent, and paternal ancestors are of American/Italian descent. There is no reported Ashkenazi Jewish ancestry. There is no known consanguinity.  GENETIC COUNSELING ASSESSMENT: JAMIEON LANNEN is a 52 y.o. male with a personal and family history which is somewhat suggestive of a Hereditary Cancer Predisposition Syndrome. We, therefore, discussed and recommended the following at today's visit.   DISCUSSION: We reviewed the characteristics, features and inheritance patterns of hereditary cancer syndromes. We also discussed genetic testing, including the appropriate family members to test, the process of testing, insurance coverage and turn-around-time for results. We discussed the implications of a negative, positive and/or variant of uncertain significant result. We recommended Mr. Pistilli pursue genetic testing for the Multi-Cancer gene panel.  The Multi-Cancer Panel offered by [facility] includes sequencing and/or deletion duplication testing of the following 83 genes: ALK, APC, ATM, AXIN2,BAP1,  BARD1, BLM, BMPR1A, BRCA1, BRCA2, BRIP1, CASR, CDC73, CDH1, CDK4, CDKN1B, CDKN1C, CDKN2A (p14ARF), CDKN2A (p16INK4a), CEBPA, CHEK2, CTNNA1, DICER1, DIS3L2, EGFR (c.2369C>T, p.Thr790Met variant only), EPCAM (Deletion/duplication testing only), FH, FLCN, GATA2, GPC3, GREM1 (Promoter region deletion/duplication testing only), HOXB13 (c.251G>A, p.Gly84Glu), HRAS, KIT, MAX, MEN1, MET, MITF (c.952G>A, p.Glu318Lys variant only), MLH1, MSH2, MSH3, MSH6, MUTYH, NBN, NF1, NF2, NTHL1, PALB2, PDGFRA, PHOX2B, PMS2, POLD1, POLE, POT1, PRKAR1A, PTCH1, PTEN, RAD50, RAD51C, RAD51D, RB1, RECQL4, RET, RUNX1, SDHAF2, SDHA (sequence changes only), SDHB, SDHC, SDHD, SMAD4, SMARCA4, SMARCB1, SMARCE1, STK11, SUFU, TERC, TERT, TMEM127, TP53, TSC1, TSC2, VHL, WRN and WT1.   We discussed that only 5-10% of cancers are associated with a Hereditary cancer predisposition syndrome.  One of the most  common hereditary cancer syndromes that increases prostate cancer risk is called Hereditary Breast and Ovarian Cancer (HBOC) syndrome.  This syndrome is caused by mutations in the BRCA1 and BRCA2 genes.  This syndrome increases an individual's lifetime risk to develop prostate, breast, ovarian, pancreatic, and other types of cancer.  There are also many other cancer predisposition syndromes caused by mutations in several other genes.  We discussed that if he is found to have a mutation in one of these genes, it may impact future medical management recommendations such as increased cancer screenings and consideration of risk reducing surgeries.  A positive result could also have implications for the patient's family members.  A Negative result would mean we were unable to identify a hereditary component to his cancer, but does not rule out the possibility of a hereditary basis for his cancer.  There could be mutations that are undetectable by current technology, or in genes not yet tested or identified to increase cancer risk.    We discussed the potential to find a Variant of Uncertain Significance or VUS.  These are variants that have not yet been identified as pathogenic or benign, and it is unknown if this variant is associated with increased cancer risk or if this is a normal finding.  Most VUS's are reclassified to benign or likely benign.   It should not be used to make medical management decisions. With time, we suspect the lab will determine the significance of any VUS's identified if any.   Based on Mr. Trudel personal and family history of cancer, he meets medical criteria for genetic testing. Despite that he meets criteria, he may still have an out of pocket cost. We discussed that if his out of pocket cost for testing is over $100, the laboratory will call and confirm whether he wants to proceed with testing.  If the out of pocket cost of testing  is less than $100 he will be billed by the  genetic testing laboratory.   Mr. Kosmicki asked about genetic causes for cardiac issues, as he has a strong family history of heart disease and heart attacks.  I informed him that the area of cardiac genetics is progressing, and some genetic testing is now available for conditions such as cardiomyopathy, arrhythmias, etc. It is very reasonable for him to discuss cardiac genetic testing given his family history, however in our hereditary cancer clinic, we are not able to order cardiac specific genetic testing for him.   We suggested that perhaps a referral to a cardiologist would be helpful.  They may be able to speak more to the hereditary nature of cardiac disease and cardiac genetic testing.  He said he would talk to his primary care regarding a cardiology referral.    PLAN: After considering the risks, benefits, and limitations, Mr. Mcclaine  provided informed consent to pursue genetic testing and the blood sample was sent to [facility] for analysis of the Multi-Cancer Panel. Results should be available within approximately 2-3 weeks' time, at which point they will be disclosed by telephone to Mr. Symonette, as will any additional recommendations warranted by these results. Mr. Mctigue will receive a summary of his genetic counseling visit and a copy of his results once available. This information will also be available in Epic. We encouraged Mr. Muehl to remain in contact with cancer genetics annually so that we can continuously update the family history and inform him of any changes in cancer genetics and testing that may be of benefit for his family. Mr. Leighton questions were answered to his satisfaction today. Our contact information was provided should additional questions or concerns arise.  Based on Mr. Eggebrecht family history, we recommended his sister, who was diagnosed with breast cancer at age 66, have genetic counseling and testing. Mr. Botero will let us know if we can be of any assistance in  coordinating genetic counseling and/or testing for this family member.   Lastly, we encouraged Mr. Mullendore to remain in contact with cancer genetics annually so that we can continuously update the family history and inform him of any changes in cancer genetics and testing that may be of benefit for this family.   Mr.  Faro questions were answered to his satisfaction today. Our contact information was provided should additional questions or concerns arise. Thank you for the referral and allowing [place] to share in the care of your patient.   Tana Felts, MS, [facility] Certified Genetic Counselor [email or web address] phone: [phone]  The patient was seen for a total of 35 minutes in face-to-face genetic counseling.

## 2018-02-25 ENCOUNTER — Telehealth: Payer: Self-pay | Admitting: Genetics

## 2018-02-25 NOTE — Telephone Encounter (Signed)
Revealed negative genetic testing.   This normal result is reassuring and indicates that it is unlikely Aaron Gomez cancer is due to a hereditary cause.  It is unlikely that there is an increased risk of another cancer due to a mutation in one of these genes.  However, genetic testing is not perfect, and cannot definitively rule out a hereditary cause.  It will be important for him to keep in contact with genetics to learn if any additional testing may be needed in the future.

## 2018-02-26 ENCOUNTER — Encounter: Payer: Self-pay | Admitting: Genetics

## 2018-02-26 ENCOUNTER — Ambulatory Visit: Payer: Self-pay | Admitting: Genetics

## 2018-02-26 DIAGNOSIS — Z1379 Encounter for other screening for genetic and chromosomal anomalies: Secondary | ICD-10-CM

## 2018-02-26 DIAGNOSIS — Z803 Family history of malignant neoplasm of breast: Secondary | ICD-10-CM

## 2018-02-26 DIAGNOSIS — Z808 Family history of malignant neoplasm of other organs or systems: Secondary | ICD-10-CM

## 2018-02-26 DIAGNOSIS — C61 Malignant neoplasm of prostate: Secondary | ICD-10-CM

## 2018-02-26 NOTE — Progress Notes (Signed)
HPI: Mr. Krotzer was previously seen in the [facility] clinic on 02/16/2018 due to a personal and family history of cancer and concerns regarding a hereditary predisposition to cancer. Please refer to our prior cancer genetics clinic note for more information regarding Mr. Luu medical, social and family histories, and our assessment and recommendations, at the time. Mr. Forget recent genetic test results were disclosed to him, as well as recommendations warranted by these results. These results and recommendations are discussed in more detail below.  CANCER HISTORY:  Prostate biopsy on 01/11/2018, at the age of 52, Mr. 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  He expressed that he currently is leaning towards surgical treatment options, and has had a consult with radiation.    FAMILY HISTORY:  We obtained a detailed, 4-generation family history.  Significant diagnoses are listed below: Family History  Problem Relation Age of Onset  . Heart attack Father 39       died @ 55  . Thyroid disease Mother   . Heart attack Brother 39       1/2 brother  . Crohn's disease Brother   . Hypertension Paternal Grandmother   . Heart disease Paternal Grandmother        MI in 26s  . Diabetes Paternal Grandmother   . Heart attack Paternal Grandfather 30  . Heart attack Paternal Aunt 54  . Breast cancer Sister 35  . Melanoma Maternal Aunt   . Cancer Maternal Grandfather        parotid mixed tumor- met lung  . Colon cancer Neg Hx   . Esophageal cancer Neg Hx   . Pancreatic cancer Neg Hx   . Prostate cancer Neg Hx   . Rectal cancer Neg Hx   . Stomach cancer Neg Hx    Mr. Clinkscale has a 23 year-old son and a 10 year-old daughter with no history of cancer. Mr. Whitefield has 1 full sister who was recently diagnosed with breast cancer at 26.  She has not had any genetic testing.  Mr. Passow has a full brother who is 73 with no history of cancer. Mr. Meidinger has a  maternal half-brother who is 55 and has a history of chron's disease.  He has a 61 year-old son.  Mr. Whitefield has a paternal half brother who is 75 with no history of cancer and no children.   Mr. Risdon mother is 27 with no history of cancer.  She has thyroid disease.  MR. Spagnoli's mother has 3 half brothers and 1 half sister.  (he is unsure if they are his mother's maternal or paternal half-siblings). He has limited information about these relatives, but estimates them to be in their 33's, and that his half-aunt had melanoma.   Mr. Levitz maternal grandfather had cancer of the parotid gland - mixed tumor that metastasized to the lung.  This grandfather had 24 sisters and a brother with no history of cancer.  Mr. Fessel maternal grandmother died at 84 with no history of cancer.   Mr. Maniaci father had a heart attack at 75 and died at 62.  Mr. Steinhauser has 1 paternal uncle who died as a young child, and a paternal aunt who died at 89 due to a heart attack.  This aunt may have also had cancer- hx smoking.  This aunt has 3 daughters, but Mr. Lumley does not know any information about these relatives' health.  Mr. Woolford paternal grandfather died of a heart  attack at 52, and his paternal grandmother died at 89 and had diabetes.  This grandmother had maternal half-brother with cancer, and a maternal half sister who died a 55 and maybe had cancer.   Mr. Ouzts is unaware of previous family history of genetic testing for hereditary cancer risks. Patient's maternal ancestors are of American/German descent, and paternal ancestors are of American/Italian descent. There is no reported Ashkenazi Jewish ancestry. There is no known consanguinity.  GENETIC TEST RESULTS: Genetic testing performed through [facility]'s Multi-Cancers Panel reported out on 02/25/2018 showed no pathogenic mutations. The Multi-Cancer Panel offered by [facility] includes sequencing and/or deletion duplication testing of the following 83 genes: ALK, APC,  ATM, AXIN2,BAP1,  BARD1, BLM, BMPR1A, BRCA1, BRCA2, BRIP1, CASR, CDC73, CDH1, CDK4, CDKN1B, CDKN1C, CDKN2A (p14ARF), CDKN2A (p16INK4a), CEBPA, CHEK2, CTNNA1, DICER1, DIS3L2, EGFR (c.2369C>T, p.Thr790Met variant only), EPCAM (Deletion/duplication testing only), FH, FLCN, GATA2, GPC3, GREM1 (Promoter region deletion/duplication testing only), HOXB13 (c.251G>A, p.Gly84Glu), HRAS, KIT, MAX, MEN1, MET, MITF (c.952G>A, p.Glu318Lys variant only), MLH1, MSH2, MSH3, MSH6, MUTYH, NBN, NF1, NF2, NTHL1, PALB2, PDGFRA, PHOX2B, PMS2, POLD1, POLE, POT1, PRKAR1A, PTCH1, PTEN, RAD50, RAD51C, RAD51D, RB1, RECQL4, RET, RUNX1, SDHAF2, SDHA (sequence changes only), SDHB, SDHC, SDHD, SMAD4, SMARCA4, SMARCB1, SMARCE1, STK11, SUFU, TERC, TERT, TMEM127, TP53, TSC1, TSC2, VHL, WRN and WT1. .  The test report will be scanned into EPIC and will be located under the Molecular Pathology section of the Results Review tab.A portion of the result report is included below for reference.     We discussed with Mr. Briseno that because current genetic testing is not perfect, it is possible there may be a gene mutation in one of these genes that current testing cannot detect, but that chance is small. We also discussed, that there could be another gene that has not yet been discovered, or that we have not yet tested, that is responsible for the cancer diagnoses in the family. It is also possible there is a hereditary cause for the cancer in the family that Mr. Fiveash did not inherit and therefore was not identified in his testing.  Therefore, it is important to remain in touch with cancer genetics in the future so that we can continue to offer Mr. Niu the most up to date genetic testing.   ADDITIONAL GENETIC TESTING: We discussed with Mr. Hogston that his genetic testing was fairly extensive.  If there are are genes identified to increase cancer risk that can be analyzed in the future, we would be happy to discuss and coordinate this testing at  that time.      CANCER SCREENING RECOMMENDATIONS: This result indicates that it is unlikely Mr. Burmester has an increased risk for a future cancer due to a mutation in one of these genes. This normal test also suggests that Mr. Knoke cancer was most likely not due to an inherited predisposition associated with one of these genes.  Most cancers happen by chance and this negative test suggests that his cancer may fall into this category.  Therefore, it is recommended he continue to follow the cancer management and screening guidelines provided by his oncology and primary healthcare provider. Other factors such as her personal and family history may still affect his cancer risk.  RECOMMENDATIONS FOR FAMILY MEMBERS: Individuals in this family might be at some increased risk of developing cancer, over the general population risk, simply due to the family history of cancer. We recommended women in this family have a yearly mammogram beginning at age 28, or 33  years younger than the earliest onset of cancer, an annual clinical breast exam, and perform monthly breast self-exams. Women in this family should also have a gynecological exam as recommended by their primary provider. All family members should have a colonoscopy by age 73.  All family members should inform their physicians about the family history of cancer so their doctors can make the most appropriate screening recommendations for them.   FOLLOW-UP: Lastly, we discussed with Mr. Depuy that cancer genetics is a rapidly advancing field and it is possible that new genetic tests will be appropriate for him and/or his family members in the future. We encouraged him to remain in contact with cancer genetics on an annual basis so we can update his personal and family histories and let him know of advances in cancer genetics that may benefit this family.   Our contact number was provided. Mr. Ekholm questions were answered to his satisfaction, and he knows  he is welcome to call us at anytime with additional questions or concerns.   Ferol Luz, MS, [facility] Certified Genetic Counselor [email or web address]

## 2018-03-04 ENCOUNTER — Other Ambulatory Visit: Payer: Self-pay | Admitting: Urology

## 2018-03-05 ENCOUNTER — Encounter: Payer: Self-pay | Admitting: Urology

## 2018-03-05 NOTE — Progress Notes (Signed)
Patient met with Dr. Alinda Money on 02/23/2018 and has elected to move forward with primary surgical treatment.  He is scheduled for a bilateral nerve sparing robot-assisted laparoscopic radical prostatectomy and bilateral pelvic lymphadenectomy on Mar 25, 2018 with Dr. Alinda Money.

## 2018-03-10 NOTE — Patient Instructions (Signed)
Aaron Gomez  03/10/2018   Your procedure is scheduled on: 03/25/2018   Report to [facility] Main  Entrance  Report to admitting at     [place] AM    Call this number if you have problems the morning of surgery [phone]   Remember: Do not eat food or drink liquids :After Midnight.  8 ounces of Magnesium citrate at 12 noon day before surgery.              Fleets enema nite before surgery   Take these medicines the morning of surgery with A SIP OF WATER: Inhalers as usual and bring, Allegra, Zantac, Synthroid, Oxycodone if needed                                You may not have any metal on your body including hair pins and              piercings  Do not wear jewelry,  lotions, powders or perfumes, deodorant                         Men may shave face and neck.   Do not bring valuables to the hospital. [place].  Contacts, dentures or bridgework may not be worn into surgery.  Leave suitcase in the car. After surgery it may be brought to your room.                     Please read over the following fact sheets you were given: _____________________________________________________________________             [facility] - Preparing for Surgery Before surgery, you can play an important role.  Because skin is not sterile, your skin needs to be as free of germs as possible.  You can reduce the number of germs on your skin by washing with CHG (chlorahexidine gluconate) soap before surgery.  CHG is an antiseptic cleaner which kills germs and bonds with the skin to continue killing germs even after washing. Please DO NOT use if you have an allergy to CHG or antibacterial soaps.  If your skin becomes reddened/irritated stop using the CHG and inform your nurse when you arrive at Short Stay. Do not shave (including legs and underarms) for at least 48 hours prior to the first CHG shower.  You may shave your  face/neck. Please follow these instructions carefully:  1.  Shower with CHG Soap the night before surgery and the  morning of Surgery.  2.  If you choose to wash your hair, wash your hair first as usual with your  normal  shampoo.  3.  After you shampoo, rinse your hair and body thoroughly to remove the  shampoo.                           4.  Use CHG as you would any other liquid soap.  You can apply chg directly  to the skin and wash                       Gently with a scrungie or clean washcloth.  5.  Apply the CHG Soap to your body ONLY FROM THE NECK DOWN.   Do not use on face/ open                           Wound or open sores. Avoid contact with eyes, ears mouth and genitals (private parts).                       Wash face,  Genitals (private parts) with your normal soap.             6.  Wash thoroughly, paying special attention to the area where your surgery  will be performed.  7.  Thoroughly rinse your body with warm water from the neck down.  8.  DO NOT shower/wash with your normal soap after using and rinsing off  the CHG Soap.                9.  Pat yourself dry with a clean towel.            10.  Wear clean pajamas.            11.  Place clean sheets on your bed the night of your first shower and do not  sleep with pets. Day of Surgery : Do not apply any lotions/deodorants the morning of surgery.  Please wear clean clothes to the hospital/surgery center.  FAILURE TO FOLLOW THESE INSTRUCTIONS MAY RESULT IN THE CANCELLATION OF YOUR SURGERY PATIENT SIGNATURE_________________________________  NURSE SIGNATURE__________________________________  ________________________________________________________________________  WHAT IS A BLOOD TRANSFUSION? Blood Transfusion Information  A transfusion is the replacement of blood or some of its parts. Blood is made up of multiple cells which provide different functions.  Red blood cells carry oxygen and are used for blood loss  replacement.  White blood cells fight against infection.  Platelets control bleeding.  Plasma helps clot blood.  Other blood products are available for specialized needs, such as hemophilia or other clotting disorders. BEFORE THE TRANSFUSION  Who gives blood for transfusions?   Healthy volunteers who are fully evaluated to make sure their blood is safe. This is blood bank blood. Transfusion therapy is the safest it has ever been in the practice of medicine. Before blood is taken from a donor, a complete history is taken to make sure that person has no history of diseases nor engages in risky social behavior (examples are intravenous drug use or sexual activity with multiple partners). The donor's travel history is screened to minimize risk of transmitting infections, such as malaria. The donated blood is tested for signs of infectious diseases, such as HIV and hepatitis. The blood is then tested to be sure it is compatible with you in order to minimize the chance of a transfusion reaction. If you or a relative donates blood, this is often done in anticipation of surgery and is not appropriate for emergency situations. It takes many days to process the donated blood. RISKS AND COMPLICATIONS Although transfusion therapy is very safe and saves many lives, the main dangers of transfusion include:   Getting an infectious disease.  Developing a transfusion reaction. This is an allergic reaction to something in the blood you were given. Every precaution is taken to prevent this. The decision to have a blood transfusion has been considered carefully by your caregiver before blood is given. Blood is not given unless the benefits outweigh the risks. AFTER THE TRANSFUSION  Right after receiving a  blood transfusion, you will usually feel much better and more energetic. This is especially true if your red blood cells have gotten low (anemic). The transfusion raises the level of the red blood cells which  carry oxygen, and this usually causes an energy increase.  The nurse administering the transfusion will monitor you carefully for complications. HOME CARE INSTRUCTIONS  No special instructions are needed after a transfusion. You may find your energy is better. Speak with your caregiver about any limitations on activity for underlying diseases you may have. SEEK MEDICAL CARE IF:   Your condition is not improving after your transfusion.  You develop redness or irritation at the intravenous (IV) site. SEEK IMMEDIATE MEDICAL CARE IF:  Any of the following symptoms occur over the next 12 hours:  Shaking chills.  You have a temperature by mouth above 102 F (38.9 C), not controlled by medicine.  Chest, back, or muscle pain.  People around you feel you are not acting correctly or are confused.  Shortness of breath or difficulty breathing.  Dizziness and fainting.  You get a rash or develop hives.  You have a decrease in urine output.  Your urine turns a dark color or changes to pink, red, or brown. Any of the following symptoms occur over the next 10 days:  You have a temperature by mouth above 102 F (38.9 C), not controlled by medicine.  Shortness of breath.  Weakness after normal activity.  The white part of the eye turns yellow (jaundice).  You have a decrease in the amount of urine or are urinating less often.  Your urine turns a dark color or changes to pink, red, or brown. Document Released: 10/31/2000 Document Revised: 01/26/2012 Document Reviewed: 06/19/2008 Exit[employer] Patient Information 2014 [place].  _______________________________________________________________________  Incentive Spirometer  An incentive spirometer is a tool that can help keep your lungs clear and active. This tool measures how well you are filling your lungs with each breath. Taking long deep breaths may help reverse or decrease the chance of developing breathing (pulmonary) problems  (especially infection) following:  A long period of time when you are unable to move or be active. BEFORE THE PROCEDURE   If the spirometer includes an indicator to show your best effort, your nurse or respiratory therapist will set it to a desired goal.  If possible, sit up straight or lean slightly forward. Try not to slouch.  Hold the incentive spirometer in an upright position. INSTRUCTIONS FOR USE  1. Sit on the edge of your bed if possible, or sit up as far as you can in bed or on a chair. 2. Hold the incentive spirometer in an upright position. 3. Breathe out normally. 4. Place the mouthpiece in your mouth and seal your lips tightly around it. 5. Breathe in slowly and as deeply as possible, raising the piston or the ball toward the top of the column. 6. Hold your breath for 3-5 seconds or for as long as possible. Allow the piston or ball to fall to the bottom of the column. 7. Remove the mouthpiece from your mouth and breathe out normally. 8. Rest for a few seconds and repeat Steps 1 through 7 at least 10 times every 1-2 hours when you are awake. Take your time and take a few normal breaths between deep breaths. 9. The spirometer may include an indicator to show your best effort. Use the indicator as a goal to work toward during each repetition. 10. After each set of 10  deep breaths, practice coughing to be sure your lungs are clear. If you have an incision (the cut made at the time of surgery), support your incision when coughing by placing a pillow or rolled up towels firmly against it. Once you are able to get out of bed, walk around indoors and cough well. You may stop using the incentive spirometer when instructed by your caregiver.  RISKS AND COMPLICATIONS  Take your time so you do not get dizzy or light-headed.  If you are in pain, you may need to take or ask for pain medication before doing incentive spirometry. It is harder to take a deep breath if you are having  pain. AFTER USE  Rest and breathe slowly and easily.  It can be helpful to keep track of a log of your progress. Your caregiver can provide you with a simple table to help with this. If you are using the spirometer at home, follow these instructions: [place] IF:   You are having difficultly using the spirometer.  You have trouble using the spirometer as often as instructed.  Your pain medication is not giving enough relief while using the spirometer.  You develop fever of 100.5 F (38.1 C) or higher. SEEK IMMEDIATE MEDICAL CARE IF:   You cough up bloody sputum that had not been present before.  You develop fever of 102 F (38.9 C) or greater.  You develop worsening pain at or near the incision site. MAKE SURE YOU:   Understand these instructions.  Will watch your condition.  Will get help right away if you are not doing well or get worse. Document Released: 03/16/2007 Document Revised: 01/26/2012 Document Reviewed: 05/17/2007 [facility] Patient Information 2014 [place], [place].   ________________________________________________________________________

## 2018-03-11 ENCOUNTER — Encounter: Payer: Self-pay | Admitting: Family Medicine

## 2018-03-15 ENCOUNTER — Encounter (HOSPITAL_COMMUNITY)
Admission: RE | Admit: 2018-03-15 | Discharge: 2018-03-15 | Disposition: A | Discharge status: Home / Self Care | Payer: BLUE CROSS/BLUE SHIELD | Source: Ambulatory Visit | Attending: Urology | Admitting: Urology

## 2018-03-15 ENCOUNTER — Encounter (HOSPITAL_COMMUNITY): Payer: Self-pay

## 2018-03-15 ENCOUNTER — Other Ambulatory Visit: Payer: Self-pay

## 2018-03-15 DIAGNOSIS — Z01812 Encounter for preprocedural laboratory examination: Secondary | ICD-10-CM | POA: Insufficient documentation

## 2018-03-15 DIAGNOSIS — C61 Malignant neoplasm of prostate: Secondary | ICD-10-CM | POA: Diagnosis not present

## 2018-03-15 HISTORY — DX: Personal history of urinary calculi: Z87.442

## 2018-03-15 HISTORY — DX: Other specified postprocedural states: R11.2

## 2018-03-15 HISTORY — DX: Respiratory tuberculosis unspecified: A15.9

## 2018-03-15 HISTORY — DX: Other complications of anesthesia, initial encounter: T88.59XA

## 2018-03-15 HISTORY — DX: Adverse effect of unspecified anesthetic, initial encounter: T41.45XA

## 2018-03-15 HISTORY — DX: Other specified postprocedural states: Z98.890

## 2018-03-15 LAB — CBC
HCT: 46.1 % (ref 39.0–52.0)
HEMOGLOBIN: 15.5 g/dL (ref 13.0–17.0)
MCH: 30.6 pg (ref 26.0–34.0)
MCHC: 33.6 g/dL (ref 30.0–36.0)
MCV: 90.9 fL (ref 78.0–100.0)
PLATELETS: 276 10*3/uL (ref 150–400)
RBC: 5.07 MIL/uL (ref 4.22–5.81)
RDW: 12.2 % (ref 11.5–15.5)
WBC: 6.1 10*3/uL (ref 4.0–10.5)

## 2018-03-15 LAB — BASIC METABOLIC PANEL
ANION GAP: 8 (ref 5–15)
BUN: 16 mg/dL (ref 6–20)
CO2: 27 mmol/L (ref 22–32)
CREATININE: 1.01 mg/dL (ref 0.61–1.24)
Calcium: 9.5 mg/dL (ref 8.9–10.3)
Chloride: 104 mmol/L (ref 101–111)
GFR calc non Af Amer: >60 mL/min (ref >60)
GLUCOSE: 102 mg/dL — AB (ref 65–99)
Potassium: 4.6 mmol/L (ref 3.5–5.1)
Sodium: 139 mmol/L (ref 135–145)

## 2018-03-15 LAB — ABO/RH: ABO/RH(D): B POS

## 2018-03-24 NOTE — H&P (Signed)
Office Visit Report     02/23/2018   --------------------------------------------------------------------------------   Aaron Gomez  MRN: [id number]  PRIMARY CARE:  Aaron Mars. Melanee Spry, MD  DOB: [birth year], 52 year old Male  REFERRING:    SSN: [id number]  PROVIDER:  Ellison Gomez, M.D.    TREATING:  Aaron Gomez, M.D.    LOCATION:  [facility] [phone]   --------------------------------------------------------------------------------   CC/HPI: CC: Prostate Cancer   Physician requesting consult: Dr. Aleen Gomez  PCP: Dr. 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 This confirmed Gleason 4+3=7 adenocarcinoma with 3 out of 12 biopsy cores positive for malignancy.   Family history: None.   Imaging studies: CT abdomen and pelvis (01/22/18) - negative for metastatic disease, no stones   PMH: He has a history of hyperlipidemia, hypothyroidism, BPH, and urolithiasis. He has several atypical allergies including suture and neosporin (? anaphylaxis).  PSH: No abdominal surgeries.   TNM stage: cT1c N0 M0  PSA: 4.38  Gleason score: 4+3=7  Biopsy (01/11/18): 3/12 cores positive  Left: L lateral apex (5%, 3+4=7), L lateral mid (20%, 4+3=7), L base (5%, 4+3=7)  Right: Benign  Prostate volume: 28 cc   Nomogram  OC disease: 45%  EPE: 52%  SVI: 5%  LNI: 6%  PFS (5 year, 10 year): 76%, 63%   Urinary function: IPSS is 0. He has taken tamsulosin.  Erectile function: SHIM score is 24.     ALLERGIES: Banana insects that sting Neomycin Sulfate TABS - Anaphylaxis Neosporin - Anaphylaxis Strawberry suture animal based like catgut Tape - Skin Rash Vicodin - ??    MEDICATIONS: Levaquin 500 mg tablet 1 tablet PO As Directed  Allegra 180 MG TABS Oral  Aspirin 81 MG TABS Oral  Atorvastatin Calcium 20 mg tablet Oral  Epinephrine 0.3  mg/0.3 ml syringe Injection  Hydrocodone-Acetaminophen 5 mg-325 mg tablet 1 tablet PO As Directed  Ibuprofen 600 mg tablet 0 Oral  Levothyroxine Sodium 175 mcg tablet Oral  Maxalt Mlt 10 mg tablet,disintegrating Oral  Ondansetron HCl - 8 MG Oral Tablet Oral 4 times daily  Oxycodone-Acetaminophen 10 mg-325 mg tablet Oral  Sprix 15.75 MG/SPRAY Nasal Solution 1 intra-nasal spray Q 6-8 hours PRN flank pain  Tamsulosin HCl - 0.4 MG Oral Capsule 0 Oral Bedtime  Zantac 150 MG CAPS Oral  Zyrtec 10 mg tablet Oral     GU PSH: ESWL - 2016 Prostate Needle Biopsy - 01/11/2018 Vasectomy - 2012      PSH Notes: Lithotripsy, Elbow Surgery, Arthroscopy Knee Right, Laminectomy Lumbar, Surgery Of Male Genitalia Vasectomy, Oral Surgery Tooth Extraction, Laminectomy Lumbar   NON-GU PSH: Dental Surgery Procedure - 2012 Knee Arthroscopy; Dx - 2012 Remove Spinal Lamina - 2012, 2012 Surgical Pathology, Gross And Microscopic Examination For Prostate Needle - 01/11/2018    GU PMH: Prostate Cancer, Multifocal Gleason 4+3= 7 prostate cancer involving the left base, left mid and left apex. Unfavorable intermediate risk. - 01/22/2018 Elevated PSA (Worsening) - 12/04/2017 History of urolithiasis (Stable) - 12/04/2017 Renal calculus, Nephrolithiasis - 2017, Kidney stone on right side, - 2017 Ureteral calculus, Calculus of right ureter - 09/11/2015 Abdominal Pain Unspec, Right flank pain - 2016    NON-GU PMH: Encounter for general adult medical examination without abnormal findings, Encounter for preventive health examination - 2017 Personal history of other specified conditions, History  of bilateral flank pain - 2016 Asthma, Asthma - 2014 Personal history of other diseases of the digestive system, History of esophageal reflux - 2014    FAMILY HISTORY: Acute Myocardial Infarction - Father Family Health Status - Mother's Age - Runs In Family Family Health Status Number - Runs In Family Father Deceased At [place] ___  - Runs In Family Heart Disease - Runs In Family nephrolithiasis - Father   SOCIAL HISTORY: Marital Status: Married Current Smoking Status: Patient does not smoke anymore.   Tobacco Use Assessment Completed: Used Tobacco in last 30 days?     Notes: Former smoker, Occupation: Secretary/administrator, Marital History - Currently Married, A Social Drinker, Caffeine Use   REVIEW OF SYSTEMS:    GU Review Male:   Patient denies trouble starting your streams, get up at night to urinate, frequent urination, stream starts and stops, burning/ pain with urination, have to strain to urinate , hard to postpone urination, and leakage of urine.  Gastrointestinal (Lower):   Patient denies diarrhea and constipation.  Gastrointestinal (Upper):   Patient denies nausea and vomiting.  Constitutional:   Patient denies fever, night sweats, weight loss, and fatigue.  Skin:   Patient denies skin rash/ lesion and itching.  Eyes:   Patient denies blurred vision and double vision.  Ears/ Nose/ Throat:   Patient denies sore throat and sinus problems.  Hematologic/Lymphatic:   Patient denies swollen glands and easy bruising.  Cardiovascular:   Patient denies leg swelling and chest pains.  Respiratory:   Patient denies cough and shortness of breath.  Endocrine:   Patient denies excessive thirst.  Musculoskeletal:   Patient denies back pain and joint pain.  Neurological:   Patient denies headaches and dizziness.  Psychologic:   Patient denies depression and anxiety.   VITAL SIGNS:      02/23/2018 08:26 AM  Weight 194 lb / 88 kg  Height 72 in / 182.88 cm  BP 131/74 mmHg  Pulse 59 /min  BMI 26.3 kg/m   GU PHYSICAL EXAMINATION:    Prostate: Prostate about 30 grams. Left lobe normal consistency, right lobe normal consistency. Symmetrical lobes. No prostate nodule. Left lobe no tenderness, right lobe no tenderness.    MULTI-SYSTEM PHYSICAL EXAMINATION:    Constitutional: Well-nourished. No physical  deformities. Normally developed. Good grooming.  Neck: Neck symmetrical, not swollen. Normal tracheal position.  Respiratory: No labored breathing, no use of accessory muscles. Normal breath sounds. Clear bilaterally.  Cardiovascular: Regular rate and rhythm. No murmur, no gallop. Normal temperature, normal extremity pulses, no swelling, no varicosities.  Lymphatic: No enlargement of neck, axillae, groin.  Skin: No paleness, no jaundice, no cyanosis. No lesion, no ulcer, no rash.  Neurologic / Psychiatric: Oriented to time, oriented to place, oriented to person. No depression, no anxiety, no agitation.  Gastrointestinal: No mass, no tenderness, no rigidity, non obese abdomen.  Eyes: Normal conjunctivae. Normal eyelids.  Ears, Nose, Mouth, and Throat: Left ear no scars, no lesions, no masses. Right ear no scars, no lesions, no masses. Nose no scars, no lesions, no masses. Normal hearing. Normal lips.  Musculoskeletal: Normal gait and station of head and neck.     PAST DATA REVIEWED:  Source Of History:  Patient  Lab Test Review:   PSA  Records Review:   Pathology Reports, Previous Patient Records   01/22/18 07/07/17 06/17/16  PSA  Total PSA 5.82 ng/mL 4.38 ng/dl 3.93 ng/dl    PROCEDURES: None   ASSESSMENT:  ICD-10 Details  1 GU:   Prostate Cancer - C61    PLAN:           Schedule Return Visit/Planned Activity: Next Available Appointment - PT/OT Referral             Note: Please schedule patient for preoperative PT prior to radical prostatectomy.    Return Visit/Planned Activity: Other See Visit Notes             Note: Will call to schedule surgery          Document Letter(s):  Created for Patient: Clinical Summary         Notes:   1. Prostate cancer: I had a detailed discussion with Mr. Berenson and his wife today. He is very well informed through his prior discussions with Dr. Lovena Neighbours and Dr. Tammi Klippel. He adamantly does wish to proceed with surgical therapy.   The patient  was counseled about the natural history of prostate cancer and the standard treatment options that are available for prostate cancer. It was explained to him how his age and life expectancy, clinical stage, Gleason score, and PSA affect his prognosis, the decision to proceed with additional staging studies, as well as how that information influences recommended treatment strategies. We discussed the roles for active surveillance, radiation therapy, surgical therapy, androgen deprivation, as well as ablative therapy options for the treatment of prostate cancer as appropriate to his individual cancer situation. We discussed the risks and benefits of these options with regard to their impact on cancer control and also in terms of potential adverse events, complications, and impact on quality of life particularly related to urinary and sexual function. The patient was encouraged to ask questions throughout the discussion today and all questions were answered to his stated satisfaction. In addition, the patient was provided with and/or directed to appropriate resources and literature for further education about prostate cancer and treatment options.   We discussed surgical therapy for prostate cancer including the different available surgical approaches. We discussed, in detail, the risks and expectations of surgery with regard to cancer control, urinary control, and erectile function as well as the expected postoperative recovery process. Additional risks of surgery including but not limited to bleeding, infection, hernia formation, nerve damage, lymphocele formation, bowel/rectal injury potentially necessitating colostomy, damage to the urinary tract resulting in urine leakage, urethral stricture, and the cardiopulmonary risks such as myocardial infarction, stroke, death, venothromboembolism, etc. were explained. The risk of open surgical conversion for robotic/laparoscopic prostatectomy was also discussed.   He  will be scheduled for a bilateral nerve-sparing robot assisted laparoscopic radical prostatectomy and bilateral pelvic lymphadenectomy.   Cc: Dr. Garret Gomez  Dr. Ellison Gomez  Dr. Tyler Pita         Next Appointment:      Next Appointment: 03/02/2018 09:00 AM    Appointment Type: 60 Physical Therapy    Location: [facility] [phone]    Provider: Doran Durand    Reason for Visit: pt prior to radical prostatectomy-Makayla Lanter      E & M CODE: I spent at least 52 minutes face to face with the patient, more than 50% of that time was spent on counseling and/or coordinating care.     * Signed by Aaron Gomez, M.D. on 02/23/18 at 3:11 PM (EDT)*

## 2018-03-24 NOTE — Anesthesia Preprocedure Evaluation (Addendum)
Anesthesia Evaluation  Patient identified by MRN, date of birth, ID band Patient awake    Reviewed: Allergy & Precautions, NPO status , Patient's Chart, lab work & pertinent test results  History of Anesthesia Complications (+) PONV  Airway Mallampati: II  TM Distance: >3 FB     Dental   Pulmonary asthma , former smoker,    breath sounds clear to auscultation       Cardiovascular negative cardio ROS   Rhythm:Regular Rate:Normal     Neuro/Psych  Headaches,    GI/Hepatic GERD  ,  Endo/Other  Hypothyroidism   Renal/GU Renal disease     Musculoskeletal   Abdominal   Peds  Hematology   Anesthesia Other Findings   Reproductive/Obstetrics                            Anesthesia Physical Anesthesia Plan  ASA: III  Anesthesia Plan: General   Post-op Pain Management:    Induction: Intravenous  PONV Risk Score and Plan: 2 and Treatment may vary due to age or medical condition, Ondansetron, Dexamethasone and Midazolam  Airway Management Planned: Oral ETT  Additional Equipment:   Intra-op Plan:   Post-operative Plan: Possible Post-op intubation/ventilation  Informed Consent: I have reviewed the patients History and Physical, chart, labs and discussed the procedure including the risks, benefits and alternatives for the proposed anesthesia with the patient or authorized representative who has indicated his/her understanding and acceptance.   Dental advisory given  Plan Discussed with: CRNA and Anesthesiologist  Anesthesia Plan Comments:        Anesthesia Quick Evaluation

## 2018-03-25 ENCOUNTER — Encounter (HOSPITAL_COMMUNITY)
Admission: RE | Disposition: A | Discharge status: Home / Self Care | Payer: Self-pay | Source: Ambulatory Visit | Attending: Urology

## 2018-03-25 ENCOUNTER — Ambulatory Visit (HOSPITAL_COMMUNITY)
Admission: RE | Admit: 2018-03-25 | Discharge: 2018-03-26 | Disposition: A | Discharge status: Home / Self Care | Payer: BLUE CROSS/BLUE SHIELD | Source: Ambulatory Visit | Attending: Urology | Admitting: Urology

## 2018-03-25 ENCOUNTER — Ambulatory Visit (HOSPITAL_COMMUNITY): Payer: BLUE CROSS/BLUE SHIELD | Admitting: Certified Registered Nurse Anesthetist

## 2018-03-25 ENCOUNTER — Other Ambulatory Visit: Payer: Self-pay

## 2018-03-25 ENCOUNTER — Encounter (HOSPITAL_COMMUNITY): Payer: Self-pay | Admitting: Emergency Medicine

## 2018-03-25 DIAGNOSIS — E039 Hypothyroidism, unspecified: Secondary | ICD-10-CM | POA: Insufficient documentation

## 2018-03-25 DIAGNOSIS — Z79899 Other long term (current) drug therapy: Secondary | ICD-10-CM | POA: Insufficient documentation

## 2018-03-25 DIAGNOSIS — C61 Malignant neoplasm of prostate: Secondary | ICD-10-CM | POA: Diagnosis present

## 2018-03-25 DIAGNOSIS — Z87891 Personal history of nicotine dependence: Secondary | ICD-10-CM | POA: Diagnosis not present

## 2018-03-25 DIAGNOSIS — Z7982 Long term (current) use of aspirin: Secondary | ICD-10-CM | POA: Diagnosis not present

## 2018-03-25 DIAGNOSIS — Z8546 Personal history of malignant neoplasm of prostate: Secondary | ICD-10-CM | POA: Diagnosis present

## 2018-03-25 DIAGNOSIS — K219 Gastro-esophageal reflux disease without esophagitis: Secondary | ICD-10-CM | POA: Diagnosis not present

## 2018-03-25 DIAGNOSIS — E785 Hyperlipidemia, unspecified: Secondary | ICD-10-CM | POA: Insufficient documentation

## 2018-03-25 HISTORY — PX: LYMPHADENECTOMY: SHX5960

## 2018-03-25 HISTORY — PX: ROBOT ASSISTED LAPAROSCOPIC RADICAL PROSTATECTOMY: SHX5141

## 2018-03-25 LAB — HEMOGLOBIN AND HEMATOCRIT, BLOOD
HEMATOCRIT: 45 % (ref 39.0–52.0)
Hemoglobin: 15.2 g/dL (ref 13.0–17.0)

## 2018-03-25 LAB — TYPE AND SCREEN
ABO/RH(D): B POS
ANTIBODY SCREEN: NEGATIVE

## 2018-03-25 SURGERY — XI ROBOTIC ASSISTED LAPAROSCOPIC RADICAL PROSTATECTOMY LEVEL 2
Anesthesia: General

## 2018-03-25 MED ORDER — FENTANYL CITRATE (PF) 250 MCG/5ML IJ SOLN
INTRAMUSCULAR | Status: AC
  Filled 2018-03-25: qty 5

## 2018-03-25 MED ORDER — DIPHENHYDRAMINE HCL 12.5 MG/5ML PO ELIX: 12.5000 mg | ORAL_SOLUTION | Freq: Four times a day (QID) | ORAL | Status: DC | PRN

## 2018-03-25 MED ORDER — KETOROLAC TROMETHAMINE 15 MG/ML IJ SOLN: 15.0000 mg | Freq: Four times a day (QID) | INTRAMUSCULAR | Status: DC

## 2018-03-25 MED ORDER — PROPOFOL 10 MG/ML IV BOLUS
INTRAVENOUS | Status: DC | PRN
  Administered 2018-03-25: 200 mg via INTRAVENOUS

## 2018-03-25 MED ORDER — DEXAMETHASONE SODIUM PHOSPHATE 10 MG/ML IJ SOLN
INTRAMUSCULAR | Status: AC
  Filled 2018-03-25: qty 1

## 2018-03-25 MED ORDER — CEFAZOLIN SODIUM-DEXTROSE 2-4 GM/100ML-% IV SOLN
2.0000 g | Freq: Once | INTRAVENOUS | Status: AC
  Administered 2018-03-25: 2 g via INTRAVENOUS
  Filled 2018-03-25: qty 100

## 2018-03-25 MED ORDER — SULFAMETHOXAZOLE-TRIMETHOPRIM 800-160 MG PO TABS: 1.0000 | ORAL_TABLET | Freq: Two times a day (BID) | ORAL | 0 refills | Status: DC

## 2018-03-25 MED ORDER — HEPARIN SODIUM (PORCINE) 1000 UNIT/ML IJ SOLN
INTRAMUSCULAR | Status: AC
  Filled 2018-03-25: qty 1

## 2018-03-25 MED ORDER — LORATADINE 10 MG PO TABS
10.0000 mg | ORAL_TABLET | Freq: Every day | ORAL | Status: DC
  Administered 2018-03-26: 10 mg via ORAL
  Filled 2018-03-25: qty 1

## 2018-03-25 MED ORDER — LACTATED RINGERS IV SOLN
INTRAVENOUS | Status: DC
  Administered 2018-03-25 (×2): via INTRAVENOUS

## 2018-03-25 MED ORDER — SODIUM CHLORIDE 0.9 % IV BOLUS: 1000.0000 mL | Freq: Once | INTRAVENOUS | Status: DC

## 2018-03-25 MED ORDER — SODIUM CHLORIDE 0.9 % IR SOLN
Status: DC | PRN
  Administered 2018-03-25: 1000 mL

## 2018-03-25 MED ORDER — RIZATRIPTAN BENZOATE 10 MG PO TBDP: 10.0000 mg | ORAL_TABLET | Freq: Once | ORAL | Status: DC | PRN

## 2018-03-25 MED ORDER — PROPOFOL 10 MG/ML IV BOLUS
INTRAVENOUS | Status: AC
  Filled 2018-03-25: qty 20

## 2018-03-25 MED ORDER — CEFAZOLIN SODIUM-DEXTROSE 1-4 GM/50ML-% IV SOLN
1.0000 g | Freq: Three times a day (TID) | INTRAVENOUS | Status: AC
  Administered 2018-03-25 (×2): 1 g via INTRAVENOUS
  Filled 2018-03-25 (×2): qty 50

## 2018-03-25 MED ORDER — BUPIVACAINE-EPINEPHRINE (PF) 0.5% -1:200000 IJ SOLN
INTRAMUSCULAR | Status: AC
  Filled 2018-03-25: qty 30

## 2018-03-25 MED ORDER — FLUTICASONE PROPIONATE 50 MCG/ACT NA SUSP
1.0000 | Freq: Every day | NASAL | Status: DC
  Administered 2018-03-25: 1 via NASAL
  Filled 2018-03-25: qty 16

## 2018-03-25 MED ORDER — MORPHINE SULFATE (PF) 4 MG/ML IV SOLN
2.0000 mg | INTRAVENOUS | Status: DC | PRN
  Administered 2018-03-26: 4 mg via INTRAVENOUS
  Filled 2018-03-25: qty 1

## 2018-03-25 MED ORDER — ONDANSETRON HCL 4 MG/2ML IJ SOLN
INTRAMUSCULAR | Status: DC | PRN
  Administered 2018-03-25: 4 mg via INTRAVENOUS

## 2018-03-25 MED ORDER — ROCURONIUM BROMIDE 10 MG/ML (PF) SYRINGE
PREFILLED_SYRINGE | INTRAVENOUS | Status: DC | PRN
  Administered 2018-03-25 (×2): 30 mg via INTRAVENOUS
  Administered 2018-03-25: 20 mg via INTRAVENOUS
  Administered 2018-03-25: 50 mg via INTRAVENOUS

## 2018-03-25 MED ORDER — FENTANYL CITRATE (PF) 250 MCG/5ML IJ SOLN
INTRAMUSCULAR | Status: DC | PRN
  Administered 2018-03-25: 100 ug via INTRAVENOUS
  Administered 2018-03-25 (×3): 50 ug via INTRAVENOUS

## 2018-03-25 MED ORDER — LEVOTHYROXINE SODIUM 100 MCG PO TABS
100.0000 ug | ORAL_TABLET | Freq: Every day | ORAL | Status: DC
  Administered 2018-03-26: 100 ug via ORAL
  Filled 2018-03-25: qty 1

## 2018-03-25 MED ORDER — MIDAZOLAM HCL 2 MG/2ML IJ SOLN
INTRAMUSCULAR | Status: AC
  Filled 2018-03-25: qty 2

## 2018-03-25 MED ORDER — DIPHENHYDRAMINE HCL 50 MG/ML IJ SOLN: 12.5000 mg | Freq: Four times a day (QID) | INTRAMUSCULAR | Status: DC | PRN

## 2018-03-25 MED ORDER — BUPIVACAINE-EPINEPHRINE 0.5% -1:200000 IJ SOLN
INTRAMUSCULAR | Status: DC | PRN
  Administered 2018-03-25: 30 mL

## 2018-03-25 MED ORDER — DEXTROSE-NACL 5-0.45 % IV SOLN: INTRAVENOUS | Status: DC

## 2018-03-25 MED ORDER — MIDAZOLAM HCL 5 MG/5ML IJ SOLN
INTRAMUSCULAR | Status: DC | PRN
  Administered 2018-03-25: 2 mg via INTRAVENOUS

## 2018-03-25 MED ORDER — FENTANYL CITRATE (PF) 100 MCG/2ML IJ SOLN
INTRAMUSCULAR | Status: AC
  Filled 2018-03-25: qty 2

## 2018-03-25 MED ORDER — ONDANSETRON HCL 4 MG/2ML IJ SOLN: 4.0000 mg | INTRAMUSCULAR | Status: DC | PRN

## 2018-03-25 MED ORDER — HYDROCODONE-ACETAMINOPHEN 5-325 MG PO TABS
1.0000 | ORAL_TABLET | ORAL | Status: DC | PRN
  Administered 2018-03-25: 1 via ORAL
  Administered 2018-03-26: 2 via ORAL
  Filled 2018-03-25 (×2): qty 2

## 2018-03-25 MED ORDER — EPHEDRINE SULFATE-NACL 50-0.9 MG/10ML-% IV SOSY
PREFILLED_SYRINGE | INTRAVENOUS | Status: DC | PRN
  Administered 2018-03-25 (×4): 5 mg via INTRAVENOUS

## 2018-03-25 MED ORDER — DOCUSATE SODIUM 100 MG PO CAPS
100.0000 mg | ORAL_CAPSULE | Freq: Two times a day (BID) | ORAL | Status: DC
  Administered 2018-03-25 – 2018-03-26 (×2): 100 mg via ORAL
  Filled 2018-03-25 (×2): qty 1

## 2018-03-25 MED ORDER — ALBUTEROL SULFATE (2.5 MG/3ML) 0.083% IN NEBU: 2.5000 mg | INHALATION_SOLUTION | Freq: Four times a day (QID) | RESPIRATORY_TRACT | Status: DC | PRN

## 2018-03-25 MED ORDER — ACETAMINOPHEN 325 MG PO TABS: 650.0000 mg | ORAL_TABLET | ORAL | Status: DC | PRN

## 2018-03-25 MED ORDER — KCL IN DEXTROSE-NACL 20-5-0.45 MEQ/L-%-% IV SOLN
INTRAVENOUS | Status: DC
  Administered 2018-03-25 – 2018-03-26 (×3): via INTRAVENOUS
  Filled 2018-03-25 (×4): qty 1000

## 2018-03-25 MED ORDER — FAMOTIDINE 20 MG PO TABS
20.0000 mg | ORAL_TABLET | Freq: Every day | ORAL | Status: DC
  Administered 2018-03-26: 20 mg via ORAL
  Filled 2018-03-25: qty 1

## 2018-03-25 MED ORDER — ROCURONIUM BROMIDE 10 MG/ML (PF) SYRINGE
PREFILLED_SYRINGE | INTRAVENOUS | Status: AC
  Filled 2018-03-25: qty 5

## 2018-03-25 MED ORDER — FENTANYL CITRATE (PF) 100 MCG/2ML IJ SOLN
25.0000 ug | INTRAMUSCULAR | Status: DC | PRN
  Administered 2018-03-25 (×3): 50 ug via INTRAVENOUS

## 2018-03-25 MED ORDER — FLEET ENEMA 7-19 GM/118ML RE ENEM
1.0000 | ENEMA | Freq: Once | RECTAL | Status: DC
  Filled 2018-03-25: qty 1

## 2018-03-25 MED ORDER — FENTANYL CITRATE (PF) 100 MCG/2ML IJ SOLN: 25.0000 ug | INTRAMUSCULAR | Status: DC | PRN

## 2018-03-25 MED ORDER — SCOPOLAMINE 1 MG/3DAYS TD PT72
1.0000 | MEDICATED_PATCH | Freq: Once | TRANSDERMAL | Status: DC
  Administered 2018-03-25: 1.5 mg via TRANSDERMAL

## 2018-03-25 MED ORDER — LACTATED RINGERS IV SOLN
INTRAVENOUS | Status: DC | PRN
  Administered 2018-03-25: 1000 mL

## 2018-03-25 MED ORDER — KETOROLAC TROMETHAMINE 15 MG/ML IJ SOLN
15.0000 mg | Freq: Four times a day (QID) | INTRAMUSCULAR | Status: DC
  Administered 2018-03-25 – 2018-03-26 (×4): 15 mg via INTRAVENOUS
  Filled 2018-03-25 (×4): qty 1

## 2018-03-25 MED ORDER — SUMATRIPTAN SUCCINATE 50 MG PO TABS
100.0000 mg | ORAL_TABLET | Freq: Once | ORAL | Status: DC | PRN
  Filled 2018-03-25: qty 2

## 2018-03-25 MED ORDER — ATORVASTATIN CALCIUM 20 MG PO TABS
20.0000 mg | ORAL_TABLET | Freq: Every day | ORAL | Status: DC
  Administered 2018-03-25: 20 mg via ORAL
  Filled 2018-03-25: qty 1

## 2018-03-25 MED ORDER — OXYCODONE-ACETAMINOPHEN 10-325 MG PO TABS: 1.0000 | ORAL_TABLET | Freq: Four times a day (QID) | ORAL | 0 refills | Status: DC | PRN

## 2018-03-25 MED ORDER — SCOPOLAMINE 1 MG/3DAYS TD PT72
MEDICATED_PATCH | TRANSDERMAL | Status: AC
  Administered 2018-03-25: 1.5 mg via TRANSDERMAL
  Filled 2018-03-25: qty 1

## 2018-03-25 MED ORDER — LIDOCAINE 2% (20 MG/ML) 5 ML SYRINGE
INTRAMUSCULAR | Status: DC | PRN
  Administered 2018-03-25: 100 mg via INTRAVENOUS

## 2018-03-25 MED ORDER — ONDANSETRON HCL 4 MG/2ML IJ SOLN
INTRAMUSCULAR | Status: AC
  Filled 2018-03-25: qty 2

## 2018-03-25 MED ORDER — MAGNESIUM CITRATE PO SOLN
1.0000 | Freq: Once | ORAL | Status: DC
  Filled 2018-03-25: qty 296

## 2018-03-25 MED ORDER — DEXAMETHASONE SODIUM PHOSPHATE 10 MG/ML IJ SOLN
INTRAMUSCULAR | Status: DC | PRN
  Administered 2018-03-25: 10 mg via INTRAVENOUS

## 2018-03-25 MED ORDER — LIDOCAINE 2% (20 MG/ML) 5 ML SYRINGE
INTRAMUSCULAR | Status: AC
  Filled 2018-03-25: qty 5

## 2018-03-25 MED ORDER — KCL IN DEXTROSE-NACL 20-5-0.45 MEQ/L-%-% IV SOLN: INTRAVENOUS | Status: DC

## 2018-03-25 MED ORDER — HYDROMORPHONE HCL 1 MG/ML IJ SOLN: 0.5000 mg | INTRAMUSCULAR | Status: DC | PRN

## 2018-03-25 SURGICAL SUPPLY — 52 items
APPLICATOR COTTON TIP 6IN STRL (MISCELLANEOUS) ×3 IMPLANT
CATH FOLEY 2WAY SLVR 18FR 30CC (CATHETERS) ×3 IMPLANT
CATH ROBINSON RED A/P 16FR (CATHETERS) ×3 IMPLANT
CATH ROBINSON RED A/P 8FR (CATHETERS) ×3 IMPLANT
CATH TIEMANN FOLEY 18FR 5CC (CATHETERS) ×3 IMPLANT
CHLORAPREP W/TINT 26ML (MISCELLANEOUS) ×3 IMPLANT
CLIP VESOLOCK LG 6/CT PURPLE (CLIP) ×6 IMPLANT
COVER SURGICAL LIGHT HANDLE (MISCELLANEOUS) ×3 IMPLANT
COVER TIP SHEARS 8 DVNC (MISCELLANEOUS) ×2 IMPLANT
COVER TIP SHEARS 8MM DA VINCI (MISCELLANEOUS) ×1
CUTTER ECHEON FLEX ENDO 45 340 (ENDOMECHANICALS) ×3 IMPLANT
DECANTER SPIKE VIAL GLASS SM (MISCELLANEOUS) IMPLANT
DERMABOND ADVANCED (GAUZE/BANDAGES/DRESSINGS)
DERMABOND ADVANCED .7 DNX12 (GAUZE/BANDAGES/DRESSINGS) IMPLANT
DRAPE ARM DVNC X/XI (DISPOSABLE) ×8 IMPLANT
DRAPE COLUMN DVNC XI (DISPOSABLE) ×2 IMPLANT
DRAPE DA VINCI XI ARM (DISPOSABLE) ×4
DRAPE DA VINCI XI COLUMN (DISPOSABLE) ×1
DRAPE SURG IRRIG POUCH 19X23 (DRAPES) ×3 IMPLANT
DRSG TEGADERM 4X4.75 (GAUZE/BANDAGES/DRESSINGS) ×3 IMPLANT
ELECT REM PT RETURN 15FT ADLT (MISCELLANEOUS) ×3 IMPLANT
GLOVE BIO SURGEON STRL SZ 6.5 (GLOVE) ×3 IMPLANT
GLOVE BIOGEL M STRL SZ7.5 (GLOVE) ×6 IMPLANT
GOWN STRL REUS W/TWL LRG LVL3 (GOWN DISPOSABLE) ×9 IMPLANT
HOLDER FOLEY CATH W/STRAP (MISCELLANEOUS) ×3 IMPLANT
IRRIG SUCT STRYKERFLOW 2 WTIP (MISCELLANEOUS) ×3
IRRIGATION SUCT STRKRFLW 2 WTP (MISCELLANEOUS) ×2 IMPLANT
IV LACTATED RINGERS 1000ML (IV SOLUTION) IMPLANT
NDL SAFETY ECLIPSE 18X1.5 (NEEDLE) ×2 IMPLANT
NEEDLE HYPO 18GX1.5 SHARP (NEEDLE) ×1
PACK ROBOT UROLOGY CUSTOM (CUSTOM PROCEDURE TRAY) ×3 IMPLANT
SEAL CANN UNIV 5-8 DVNC XI (MISCELLANEOUS) ×8 IMPLANT
SEAL XI 5MM-8MM UNIVERSAL (MISCELLANEOUS) ×4
SOLUTION ELECTROLUBE (MISCELLANEOUS) ×3 IMPLANT
STAPLE RELOAD 45 GRN (STAPLE) ×2 IMPLANT
STAPLE RELOAD 45MM GREEN (STAPLE) ×1
SUT ETHILON 3 0 PS 1 (SUTURE) ×3 IMPLANT
SUT MNCRL 3 0 RB1 (SUTURE) ×2 IMPLANT
SUT MNCRL 3 0 VIOLET RB1 (SUTURE) ×2 IMPLANT
SUT MNCRL AB 4-0 PS2 18 (SUTURE) ×6 IMPLANT
SUT MONOCRYL 3 0 RB1 (SUTURE) ×2
SUT VIC AB 0 CT1 27 (SUTURE) ×1
SUT VIC AB 0 CT1 27XBRD ANTBC (SUTURE) ×2 IMPLANT
SUT VIC AB 0 UR5 27 (SUTURE) ×3 IMPLANT
SUT VIC AB 2-0 SH 27 (SUTURE) ×1
SUT VIC AB 2-0 SH 27X BRD (SUTURE) ×2 IMPLANT
SUT VICRYL 0 UR6 27IN ABS (SUTURE) ×6 IMPLANT
SYR 27GX1/2 1ML LL SAFETY (SYRINGE) ×3 IMPLANT
TOWEL OR 17X26 10 PK STRL BLUE (TOWEL DISPOSABLE) IMPLANT
TOWEL OR NON WOVEN STRL DISP B (DISPOSABLE) ×3 IMPLANT
TUBING INSUFFLATION 10FT LAP (TUBING) IMPLANT
WATER STERILE IRR 1000ML POUR (IV SOLUTION) IMPLANT

## 2018-03-25 NOTE — Anesthesia Procedure Notes (Addendum)
Procedure Name: Intubation Date/Time: 03/25/2018 7:26 AM Performed by: Mitzie Na, CRNA Pre-anesthesia Checklist: Patient identified, Emergency Drugs available, Suction available and Patient being monitored Patient Re-evaluated:Patient Re-evaluated prior to induction Oxygen Delivery Method: Circle system utilized Preoxygenation: Pre-oxygenation with 100% oxygen Induction Type: IV induction and Cricoid Pressure applied Ventilation: Oral airway inserted - appropriate to patient size and Mask ventilation without difficulty Laryngoscope Size: Mac and 4 Grade View: Grade I Tube type: Oral Tube size: 7.5 mm Number of attempts: 1 Airway Equipment and Method: Stylet Placement Confirmation: ETT inserted through vocal cords under direct vision,  positive ETCO2,  CO2 detector and breath sounds checked- equal and bilateral Secured at: 23 cm Tube secured with: Tape

## 2018-03-25 NOTE — Op Note (Signed)
Preoperative diagnosis: Clinically localized adenocarcinoma of the prostate (clinical stage T1c Nx Mx)  Postoperative diagnosis: Clinically localized adenocarcinoma of the prostate (clinical stage T1c Nx Mx)  Procedure:  1. Robotic assisted laparoscopic radical prostatectomy (bilateral nerve sparing) 2. Bilateral robotic assisted laparoscopic pelvic lymphadenectomy  Surgeon: Pryor Curia. M.D.  Assistant: Debbrah Alar, PA-C  An assistant was required for this surgical procedure.  The duties of the assistant included but were not limited to suctioning, passing suture, camera manipulation, retraction. This procedure would not be able to be performed without an Environmental consultant.  Resident: Dr. Fredrik Rigger   Anesthesia: General  Complications: None  EBL: 250 mL  IVF:  700 mL crystalloid  Specimens: 1. Prostate and seminal vesicles 2. Right pelvic lymph nodes 3. Left pelvic lymph nodes  Disposition of specimens: Pathology  Drains: 1. 20 Fr coude catheter 2. # 19 Blake pelvic drain  Indication: Aaron Gomez is a 52 y.o. year old patient with clinically localized prostate cancer.  After a thorough review of the management options for treatment of prostate cancer, he elected to proceed with surgical therapy and the above procedure(s).  We have discussed the potential benefits and risks of the procedure, side effects of the proposed treatment, the likelihood of the patient achieving the goals of the procedure, and any potential problems that might occur during the procedure or recuperation. Informed consent has been obtained.  Description of procedure:  The patient was taken to the operating room and a general anesthetic was administered. He was given preoperative antibiotics, placed in the dorsal lithotomy position, and prepped and draped in the usual sterile fashion. Next a preoperative timeout was performed. A urethral catheter was placed into the bladder and a site was selected  near the umbilicus for placement of the camera port. This was placed using a standard open Hassan technique which allowed entry into the peritoneal cavity under direct vision and without difficulty. An 8 mm robotic port was placed and a pneumoperitoneum established. The camera was then used to inspect the abdomen and there was no evidence of any intra-abdominal injuries or other abnormalities. The remaining abdominal ports were then placed. 8 mm robotic ports were placed in the right lower quadrant, left lower quadrant, and far left lateral abdominal wall. A 5 mm port was placed in the right upper quadrant and a 12 mm port was placed in the right lateral abdominal wall for laparoscopic assistance. All ports were placed under direct vision without difficulty. The surgical cart was then docked.   Utilizing the cautery scissors, the bladder was reflected posteriorly allowing entry into the space of Retzius and identification of the endopelvic fascia and prostate. The periprostatic fat was then removed from the prostate allowing full exposure of the endopelvic fascia. The endopelvic fascia was then incised from the apex back to the base of the prostate bilaterally and the underlying levator muscle fibers were swept laterally off the prostate thereby isolating the dorsal venous complex. The dorsal vein was then stapled and divided with a 45 mm Flex Echelon stapler. Attention then turned to the bladder neck which was divided anteriorly thereby allowing entry into the bladder and exposure of the urethral catheter. The catheter balloon was deflated and the catheter was brought into the operative field and used to retract the prostate anteriorly. The posterior bladder neck was then examined and was divided allowing further dissection between the bladder and prostate posteriorly until the vasa deferentia and seminal vessels were identified. The vasa deferentia  were isolated, divided, and lifted anteriorly. The seminal  vesicles were dissected down to their tips with care to control the seminal vascular arterial blood supply. These structures were then lifted anteriorly and the space between Denonvillier's fascia and the anterior rectum was developed with a combination of sharp and blunt dissection. This isolated the vascular pedicles of the prostate.  The lateral prostatic fascia was then sharply incised allowing release of the neurovascular bundles bilaterally. The vascular pedicles of the prostate were then ligated with Weck clips between the prostate and neurovascular bundles and divided with sharp cold scissor dissection resulting in neurovascular bundle preservation. The neurovascular bundles were then separated off the apex of the prostate and urethra bilaterally.  The urethra was then sharply transected allowing the prostate specimen to be disarticulated. The pelvis was copiously irrigated and hemostasis was ensured. There was no evidence for rectal injury.  Attention then turned to the right pelvic sidewall. The fibrofatty tissue between the external iliac vein, confluence of the iliac vessels, hypogastric artery, and Cooper's ligament was dissected free from the pelvic sidewall with care to preserve the obturator nerve. Weck clips were used for lymphostasis and hemostasis. An identical procedure was performed on the contralateral side and the lymphatic packets were removed for permanent pathologic analysis.  Attention then turned to the urethral anastomosis. A 2-0 Vicryl slip knot was placed between Denonvillier's fascia, the posterior bladder neck, and the posterior urethra to reapproximate these structures. A double-armed 3-0 Monocryl suture was then used to perform a 360 running tension-free anastomosis between the bladder neck and urethra. A new urethral catheter was then placed into the bladder and irrigated. There were no blood clots within the bladder and the anastomosis appeared to be watertight. A #19  Blake drain was then brought through the left lateral 8 mm port site and positioned appropriately within the pelvis. It was secured to the skin with a nylon suture. The surgical cart was then undocked. The right lateral 12 mm port site was closed at the fascial level with a 0 Vicryl suture placed laparoscopically. All remaining ports were then removed under direct vision. The prostate specimen was removed intact within the Endopouch retrieval bag via the periumbilical camera port site. This fascial opening was closed with two running 0 Vicryl sutures. 0.25% Marcaine was then injected into all port sites and all incisions were reapproximated at the skin level with 4-0 Monocryl subcuticular sutures and Dermabond. The patient appeared to tolerate the procedure well and without complications. The patient was able to be extubated and transferred to the recovery unit in satisfactory condition.   Pryor Curia MD

## 2018-03-25 NOTE — Progress Notes (Signed)
Patient ID: Aaron Gomez, male   DOB: [birth year], 52 y.o.   MRN: [id number] Post-op note  Subjective: The patient is doing well.  No complaints.  Still very sleepy  Objective: Vital signs in last 24 hours: Temp:  [97.8 F (36.6 C)-98.2 F (36.8 C)] 97.8 F (36.6 C) (05/09 1110) Pulse Rate:  [54-68] 64 (05/09 1300) Resp:  [11-16] 14 (05/09 1300) BP: (127-148)/(74-84) 147/81 (05/09 1300) SpO2:  [96 %-100 %] 100 % (05/09 1300) Weight:  [92.1 kg (203 lb)] 92.1 kg (203 lb) (05/09 0620)  Intake/Output from previous day: No intake/output data recorded. Intake/Output this shift: Total I/O In: 1200 [I.V.:1100; IV Piggyback:100] Out: 295 [Drains:45; Blood:250]  Physical Exam:  General: Alert and oriented. Abdomen: Soft, Nondistended. Incisions: Clean and dry. Urine: red  Lab Results: Recent Labs    03/25/18 1225  HGB 15.2  HCT 45.0    Assessment/Plan: POD#0   1) Continue to monitor  2) DVT prophy, clears, IS, amb, pain control   LOS: 0 days   Debbrah Alar 03/25/2018, 1:43 PM

## 2018-03-25 NOTE — Interval H&P Note (Signed)
History and Physical Interval Note:  03/25/2018 6:37 AM  Aaron Gomez  has presented today for surgery, with the diagnosis of PROSTATE CANCER  The various methods of treatment have been discussed with the patient and family. After consideration of risks, benefits and other options for treatment, the patient has consented to  Procedure(s): XI ROBOTIC ASSISTED LAPAROSCOPIC RADICAL PROSTATECTOMY LEVEL 2 (N/A) LYMPHADENECTOMY, PELVIC (Bilateral) as a surgical intervention .  The patient's history has been reviewed, patient examined, no change in status, stable for surgery.  I have reviewed the patient's chart and labs.  Questions were answered to the patient's satisfaction.     Julia Alkhatib,LES

## 2018-03-25 NOTE — Transfer of Care (Signed)
Immediate Anesthesia Transfer of Care Note  Patient: Aaron Gomez  Procedure(s) Performed: XI ROBOTIC ASSISTED LAPAROSCOPIC RADICAL PROSTATECTOMY LEVEL 2 (N/A ) LYMPHADENECTOMY, PELVIC (Bilateral )  Patient Location: PACU  Anesthesia Type:General  Level of Consciousness: drowsy and patient cooperative  Airway & Oxygen Therapy: Patient Spontanous Breathing and Patient connected to face mask oxygen  Post-op Assessment: Report given to RN, Post -op Vital signs reviewed and stable and Patient moving all extremities  Post vital signs: Reviewed and stable  Last Vitals:  Vitals Value Taken Time  BP 138/83 03/25/2018 11:10 AM  Temp    Pulse 68 03/25/2018 11:12 AM  Resp 16 03/25/2018 11:12 AM  SpO2 100 % 03/25/2018 11:12 AM  Vitals shown include unvalidated device data.  Last Pain:  Vitals:   03/25/18 0620  TempSrc:   PainSc: 0-No pain      Patients Stated Pain Goal: 4 (33/38/32 9191)  Complications: No apparent anesthesia complications

## 2018-03-25 NOTE — Anesthesia Postprocedure Evaluation (Signed)
Anesthesia Post Note  Patient: Aaron Gomez  Procedure(s) Performed: XI ROBOTIC ASSISTED LAPAROSCOPIC RADICAL PROSTATECTOMY LEVEL 2 (N/A ) LYMPHADENECTOMY, PELVIC (Bilateral )     Patient location during evaluation: PACU Anesthesia Type: General Level of consciousness: awake Pain management: pain level controlled Vital Signs Assessment: post-procedure vital signs reviewed and stable Respiratory status: spontaneous breathing Cardiovascular status: stable Anesthetic complications: no    Last Vitals:  Vitals:   03/25/18 1110 03/25/18 1115  BP: 138/83   Pulse: 66 (!) 57  Resp: 14 15  Temp: 36.6 C   SpO2: 100% 100%    Last Pain:  Vitals:   03/25/18 1110  TempSrc:   PainSc: Asleep                 Camaron Cammack

## 2018-03-25 NOTE — Discharge Instructions (Signed)

## 2018-03-26 DIAGNOSIS — C61 Malignant neoplasm of prostate: Secondary | ICD-10-CM | POA: Diagnosis not present

## 2018-03-26 LAB — BASIC METABOLIC PANEL
Anion gap: 8 (ref 5–15)
BUN: 12 mg/dL (ref 6–20)
CALCIUM: 8.7 mg/dL — AB (ref 8.9–10.3)
CHLORIDE: 102 mmol/L (ref 101–111)
CO2: 28 mmol/L (ref 22–32)
CREATININE: 1.11 mg/dL (ref 0.61–1.24)
GFR calc Af Amer: >60 mL/min (ref >60)
GFR calc non Af Amer: >60 mL/min (ref >60)
GLUCOSE: 125 mg/dL — AB (ref 65–99)
Potassium: 4.3 mmol/L (ref 3.5–5.1)
Sodium: 138 mmol/L (ref 135–145)

## 2018-03-26 LAB — HEMOGLOBIN AND HEMATOCRIT, BLOOD
HCT: 40.2 % (ref 39.0–52.0)
Hemoglobin: 13.4 g/dL (ref 13.0–17.0)

## 2018-03-26 MED ORDER — BISACODYL 10 MG RE SUPP
10.0000 mg | Freq: Once | RECTAL | Status: AC
  Administered 2018-03-26: 10 mg via RECTAL
  Filled 2018-03-26: qty 1

## 2018-03-26 NOTE — Discharge Summary (Signed)
Date of admission: 03/25/2018  Date of discharge: 03/26/2018  Admission diagnosis: Prostate Cancer  Discharge diagnosis: Prostate Cancer  History and Physical: For full details, please see admission history and physical. Briefly, Aaron Gomez is a 52 y.o. gentleman with localized prostate cancer.  After discussing management/treatment options, he elected to proceed with surgical treatment.  Hospital Course: Aaron Gomez was taken to the operating room on 03/25/2018 and underwent a robotic assisted laparoscopic radical prostatectomy. He tolerated this procedure well and without complications. Postoperatively, he was able to be transferred to a regular hospital room following recovery from anesthesia.  He was able to begin ambulating the night of surgery. He remained hemodynamically stable overnight.  He had excellent urine output with appropriately minimal output from his pelvic drain and his pelvic drain was removed on POD #1.  He was transitioned to oral pain medication, tolerated a clear liquid diet, and had met all discharge criteria and was able to be discharged home later on POD#1.  Laboratory values:  Recent Labs    03/25/18 1225 03/26/18 0523  HGB 15.2 13.4  HCT 45.0 40.2    Disposition: Home  Discharge instruction: He was instructed to be ambulatory but to refrain from heavy lifting, strenuous activity, or driving. He was instructed on urethral catheter care.  Discharge medications:   Allergies as of 03/26/2018      Reactions   Banana Anaphylaxis   Bee Venom Anaphylaxis   Also venom from wasps & hornets cause anaphylaxis; he carries Epi-Pen & has Medi Alert bracelet   Neomycin Sulfate Anaphylaxis   Anaphylactic shock  Because of a history of documented adverse serious drug reaction;Medi Alert bracelet  is recommended   Strawberry Extract Anaphylaxis   Tape Anaphylaxis   adhesvie tape-rash, blisters   Venomil Mixed Vespid  [mixed Vespid Venom] Anaphylaxis   Percocet  [oxycodone-acetaminophen] Nausea Only   Post surgery      Medication List    STOP taking these medications   aspirin 81 MG tablet   ibuprofen 200 MG tablet Commonly known as:  ADVIL,MOTRIN   Ketorolac Tromethamine 15.75 MG/SPRAY Soln     TAKE these medications   atorvastatin 20 MG tablet Commonly known as:  LIPITOR TAKE 1 TABLET (20 MG TOTAL) BY MOUTH DAILY.   AUVI-Q 0.3 mg/0.3 mL Soaj injection Generic drug:  EPINEPHrine Inject 0.3 mg into the muscle once.   fexofenadine 180 MG tablet Commonly known as:  ALLEGRA Take 180 mg by mouth daily.   FLOMAX 0.4 MG Caps capsule Generic drug:  tamsulosin Take 0.4 mg by mouth daily as needed (at onset of kidney stones).   fluticasone 50 MCG/ACT nasal spray Commonly known as:  FLONASE Place 1 spray into the nose at bedtime.   levothyroxine 100 MCG tablet Commonly known as:  SYNTHROID, LEVOTHROID Take 1 tablet (100 mcg total) by mouth daily.   oxyCODONE-acetaminophen 10-325 MG tablet Commonly known as:  PERCOCET Take 1 tablet by mouth every 6 (six) hours as needed for pain (migraine.). What changed:  when to take this   PROAIR RESPICLICK 161 (90 Base) MCG/ACT Aepb Generic drug:  Albuterol Sulfate Inhale 2 puffs into the lungs every 6 (six) hours as needed (shortness of breath).   ranitidine 150 MG capsule Commonly known as:  ZANTAC Take 150 mg by mouth daily.   rizatriptan 10 MG disintegrating tablet Commonly known as:  MAXALT-MLT TAKE 1 TABLET BY MOUTH EVERY DAY AS NEEDED FOR MIGRAINE. DISSOLVE IN MOUTH   sulfamethoxazole-trimethoprim 800-160 MG  tablet Commonly known as:  BACTRIM DS,SEPTRA DS Take 1 tablet by mouth 2 (two) times daily. Start the day prior to foley removal appointment       Followup: He will followup in 1 week for catheter removal and to discuss his surgical pathology results.

## 2018-03-26 NOTE — Progress Notes (Signed)
Patient ID: Aaron Gomez, male   DOB: 04-16-1966, 52 y.o.   MRN: [id number]  1 Day Post-Op Subjective: The patient is doing well.  No nausea or vomiting. Pain is adequately controlled.  Objective: Vital signs in last 24 hours: Temp:  [97.3 F (36.3 C)-98.2 F (36.8 C)] 98 F (36.7 C) (05/10 0526) Pulse Rate:  [54-79] 55 (05/10 0526) Resp:  [11-22] 20 (05/10 0526) BP: (105-149)/(63-89) 122/82 (05/10 0526) SpO2:  [96 %-100 %] 99 % (05/10 0526)  Intake/Output from previous day: 05/09 0701 - 05/10 0700 In: 3337.5 [I.V.:3187.5; IV Piggyback:150] Out: 0174 [Urine:2950; Drains:170; Blood:250] Intake/Output this shift: No intake/output data recorded.  Physical Exam:  General: Alert and oriented. CV: RRR Lungs: Clear bilaterally. GI: Soft, Nondistended. Incisions: Clean, dry, and intact Urine: Clear Extremities: Nontender, no erythema, no edema.  Lab Results: Recent Labs    03/25/18 1225 03/26/18 0523  HGB 15.2 13.4  HCT 45.0 40.2      Assessment/Plan: POD# 1 s/p robotic prostatectomy.  1) SL IVF 2) Ambulate, Incentive spirometry 3) Transition to oral pain medication 4) Dulcolax suppository 5) D/C pelvic drain 6) Plan for likely discharge later today   Pryor Curia. MD   LOS: 0 days   Jariah Tarkowski,LES 03/26/2018, 7:24 AM

## 2018-03-26 NOTE — Progress Notes (Signed)
Patient given discharge, follow up, medication, and foley care instructions, verbalized understanding, IV removed, personal belongings and prescriptions with patient, family to transport home,

## 2018-03-29 ENCOUNTER — Other Ambulatory Visit: Payer: Self-pay | Admitting: Family Medicine

## 2018-04-05 ENCOUNTER — Encounter: Payer: Self-pay | Admitting: Family Medicine

## 2018-06-06 ENCOUNTER — Other Ambulatory Visit: Payer: Self-pay | Admitting: Family Medicine

## 2018-06-25 LAB — PSA: PSA: 0.015

## 2018-06-29 ENCOUNTER — Other Ambulatory Visit: Payer: Self-pay | Admitting: Family Medicine

## 2018-07-09 ENCOUNTER — Encounter: Payer: Self-pay | Admitting: Family Medicine

## 2018-07-29 ENCOUNTER — Ambulatory Visit (INDEPENDENT_AMBULATORY_CARE_PROVIDER_SITE_OTHER): Payer: BLUE CROSS/BLUE SHIELD | Admitting: Family Medicine

## 2018-07-29 ENCOUNTER — Encounter: Payer: Self-pay | Admitting: Family Medicine

## 2018-07-29 VITALS — BP 134/88 | HR 70 | Temp 98.2°F | Ht 71.0 in | Wt 198.0 lb

## 2018-07-29 DIAGNOSIS — G43009 Migraine without aura, not intractable, without status migrainosus: Secondary | ICD-10-CM

## 2018-07-29 DIAGNOSIS — Z8249 Family history of ischemic heart disease and other diseases of the circulatory system: Secondary | ICD-10-CM | POA: Diagnosis not present

## 2018-07-29 DIAGNOSIS — Z23 Encounter for immunization: Secondary | ICD-10-CM

## 2018-07-29 DIAGNOSIS — R739 Hyperglycemia, unspecified: Secondary | ICD-10-CM

## 2018-07-29 DIAGNOSIS — E785 Hyperlipidemia, unspecified: Secondary | ICD-10-CM | POA: Diagnosis not present

## 2018-07-29 DIAGNOSIS — Z0000 Encounter for general adult medical examination without abnormal findings: Secondary | ICD-10-CM | POA: Diagnosis not present

## 2018-07-29 DIAGNOSIS — C61 Malignant neoplasm of prostate: Secondary | ICD-10-CM

## 2018-07-29 DIAGNOSIS — E039 Hypothyroidism, unspecified: Secondary | ICD-10-CM

## 2018-07-29 LAB — CBC WITH DIFFERENTIAL/PLATELET
BASOS ABS: 0.1 10*3/uL (ref 0.0–0.1)
Basophils Relative: 0.7 % (ref 0.0–3.0)
EOS ABS: 0.4 10*3/uL (ref 0.0–0.7)
Eosinophils Relative: 4.7 % (ref 0.0–5.0)
HEMATOCRIT: 45.6 % (ref 39.0–52.0)
HEMOGLOBIN: 15.7 g/dL (ref 13.0–17.0)
LYMPHS PCT: 20.4 % (ref 12.0–46.0)
Lymphs Abs: 1.9 10*3/uL (ref 0.7–4.0)
MCHC: 34.5 g/dL (ref 30.0–36.0)
MCV: 88 fl (ref 78.0–100.0)
MONOS PCT: 13.8 % — AB (ref 3.0–12.0)
Monocytes Absolute: 1.3 10*3/uL — ABNORMAL HIGH (ref 0.1–1.0)
Neutro Abs: 5.6 10*3/uL (ref 1.4–7.7)
Neutrophils Relative %: 60.4 % (ref 43.0–77.0)
Platelets: 302 10*3/uL (ref 150.0–400.0)
RBC: 5.18 Mil/uL (ref 4.22–5.81)
RDW: 12.7 % (ref 11.5–15.5)
WBC: 9.3 10*3/uL (ref 4.0–10.5)

## 2018-07-29 LAB — TSH: TSH: 2.24 u[IU]/mL (ref 0.35–4.50)

## 2018-07-29 LAB — LIPID PANEL
CHOLESTEROL: 153 mg/dL (ref 0–200)
HDL: 43 mg/dL (ref >39.00)
NONHDL: 109.77
TRIGLYCERIDES: 220 mg/dL — AB (ref 0.0–149.0)
Total CHOL/HDL Ratio: 4
VLDL: 44 mg/dL — ABNORMAL HIGH (ref 0.0–40.0)

## 2018-07-29 LAB — COMPREHENSIVE METABOLIC PANEL
ALBUMIN: 4.5 g/dL (ref 3.5–5.2)
ALT: 38 U/L (ref 0–53)
AST: 29 U/L (ref 0–37)
Alkaline Phosphatase: 83 U/L (ref 39–117)
BUN: 17 mg/dL (ref 6–23)
CALCIUM: 9.5 mg/dL (ref 8.4–10.5)
CO2: 29 meq/L (ref 19–32)
CREATININE: 1.29 mg/dL (ref 0.40–1.50)
Chloride: 101 mEq/L (ref 96–112)
GFR: 62.17 mL/min (ref >60.00)
Glucose, Bld: 84 mg/dL (ref 70–99)
Potassium: 4.2 mEq/L (ref 3.5–5.1)
Sodium: 137 mEq/L (ref 135–145)
TOTAL PROTEIN: 7.2 g/dL (ref 6.0–8.3)
Total Bilirubin: 0.5 mg/dL (ref 0.2–1.2)

## 2018-07-29 LAB — HEMOGLOBIN A1C: Hgb A1c MFr Bld: 5.8 % (ref 4.6–6.5)

## 2018-07-29 LAB — LDL CHOLESTEROL, DIRECT: Direct LDL: 82 mg/dL

## 2018-07-29 NOTE — Assessment & Plan Note (Signed)
Advanced to 5+4= 9 and prostatectomy Dr. Alinda Money Prior followed with Dr. Gilford [facility]. 3 cores it appears. 4+3=7

## 2018-07-29 NOTE — Assessment & Plan Note (Signed)
has been well controlled on atorvastatin 20mg . Also on aspirin. Strong family history- premature MI in father (In much better health than other family members). Update lipids. With family history wants EKG.he wants to consider coronary CT next year after discussion.

## 2018-07-29 NOTE — Addendum Note (Signed)
Addended by: Marian Sorrow on: 07/29/2018 02:24 PM   Modules accepted: Orders

## 2018-07-29 NOTE — Assessment & Plan Note (Signed)
tends to get smell of smoke when TSH is off and having some now- we will get updated TSH. On levothyroxine 100 mcg

## 2018-07-29 NOTE — Patient Instructions (Addendum)
Shingrix #1 today. Repeat injection in 2-5 months. Schedule this before you leave.   EKG is beautiful once again  No changes today (unless thyroid levels lead [place] to make adjustments)  Please stop by lab before you go

## 2018-07-29 NOTE — Progress Notes (Signed)
Phone: [phone]  Subjective:  Patient presents today for their annual physical. Chief complaint-noted.   See problem oriented charting- ROS- full  review of systems was completed and negative except for: some dribbling after urination at times  The following were reviewed and entered/updated in epic: Past Medical History:  Diagnosis Date  . Allergy    venom anaphylaxis  . Asthma    extrinsic  . 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  . Complication of anesthesia    slow to wake up   . Family history of breast cancer   . Family history of melanoma   . Family history of parotid cancer   . GERD (gastroesophageal reflux disease)   . Headache(784.0)    hx of migraines   . History of kidney stones   . Hyperlipidemia   . Hypothyroidism   . Nephrolithiasis     X 8,Dr Tannebaum  . PONV (postoperative nausea and vomiting)   . Prostate cancer ([place])   . Tuberculosis    hx of ox 1    Patient Active Problem List   Diagnosis Date Noted  . Prostate cancer ([place]) 01/26/2018    Priority: High  . Hypothyroidism 07/06/2015    Priority: Medium  . 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 Hyperlipidemia 01/12/2009    Priority: Medium  . Asthma 01/12/2009    Priority: Medium  . Common migraine 01/12/2009    Priority: Medium  . Genetic testing 02/26/2018    Priority: Low  . Family history of breast cancer     Priority: Low  . Family history of melanoma     Priority: Low  . Family history of parotid cancer     Priority: Low  . Plantar fasciitis of right foot 01/17/2018    Priority: Low  . Eczema 01/11/2016    Priority: Low  . Snoring 02/23/2015    Priority: Low  . TINEA PEDIS 09/25/2009    Priority: Low  . Allergic rhinitis 01/12/2009    Priority: Low  . GERD 01/12/2009    Priority: Low  . NEPHROLITHIASIS, HX OF 01/12/2009    Priority: Low   Past Surgical History:  Procedure Laterality Date  . ELBOW SURGERY     [facility]  . KNEE ARTHROSCOPY  2011   R knee; Dr  Maureen Ralphs  . LUMBAR FUSION  2004   Dr Trenton Gammon  . LYMPHADENECTOMY Bilateral 03/25/2018   Procedure: LYMPHADENECTOMY, PELVIC;  Surgeon: Raynelle Bring, MD;  Location: [facility];  Service: Urology;  Laterality: Bilateral;  . MICRODISCECTOMY LUMBAR  1999   L4-5,L5-S1;Dr. Trenton Gammon  . NASAL SEPTOPLASTY W/ TURBINOPLASTY    . ROBOT ASSISTED LAPAROSCOPIC RADICAL PROSTATECTOMY N/A 03/25/2018   Procedure: XI ROBOTIC ASSISTED LAPAROSCOPIC RADICAL PROSTATECTOMY LEVEL 2;  Surgeon: Raynelle Bring, MD;  Location: [facility];  Service: Urology;  Laterality: N/A;  . TONSILLECTOMY    . VASECTOMY  2000    Family History  Problem Relation Age of Onset  . Heart attack Father 91       died @ 16  . Thyroid disease Mother   . Heart attack Brother 51       1/2 brother  . Crohn's disease Brother   . Hypertension Paternal Grandmother   . Heart disease Paternal Grandmother        MI in 76s  . Diabetes Paternal Grandmother   . Heart attack Paternal Grandfather 45  . Heart attack Paternal Aunt 74  . Breast cancer Sister 14  .  Melanoma Maternal Aunt   . Cancer Maternal Grandfather        parotid mixed tumor- met lung  . Colon cancer Neg Hx   . Esophageal cancer Neg Hx   . Pancreatic cancer Neg Hx   . Prostate cancer Neg Hx   . Rectal cancer Neg Hx   . Stomach cancer Neg Hx     Medications- reviewed and updated Current Outpatient Medications  Medication Sig Dispense Refill  . Albuterol Sulfate (PROAIR RESPICLICK) 098 (90 Base) MCG/ACT AEPB Inhale 2 puffs into the lungs every 6 (six) hours as needed (shortness of breath).    [name] atorvastatin (LIPITOR) 20 MG tablet TAKE 1 TABLET BY MOUTH EVERY DAY 90 tablet 1  . EPINEPHrine (AUVI-Q) 0.3 mg/0.3 mL IJ SOAJ injection Inject 0.3 mg into the muscle once.    . fexofenadine (ALLEGRA) 180 MG tablet Take 180 mg by mouth daily.     . fluticasone (FLONASE) 50 MCG/ACT nasal spray Place 1 spray into the nose at bedtime.     [name] levothyroxine (SYNTHROID, LEVOTHROID) 100 MCG tablet TAKE 1  TABLET BY MOUTH EVERY DAY 30 tablet 0  . oxyCODONE-acetaminophen (PERCOCET) 10-325 MG tablet Take 1 tablet by mouth every 6 (six) hours as needed for pain (migraine.). 20 tablet 0  . ranitidine (ZANTAC) 150 MG capsule Take 150 mg by mouth daily.     . rizatriptan (MAXALT-MLT) 10 MG disintegrating tablet TAKE 1 TABLET BY MOUTH EVERY DAY AS NEEDED FOR MIGRAINE. DISSOLVE IN MOUTH 12 tablet 5  . sildenafil (REVATIO) 20 MG tablet Take 20-100 mg by mouth at bedtime.     No current facility-administered medications for this visit.     Allergies-reviewed and updated Allergies  Allergen Reactions  . Banana Anaphylaxis  . Bee Venom Anaphylaxis    Also venom from wasps & hornets cause anaphylaxis; he carries Epi-Pen & has Medi Alert bracelet  . Neomycin Sulfate Anaphylaxis    Anaphylactic shock  Because of a history of documented adverse serious drug reaction;Medi Alert bracelet  is recommended  . Strawberry Extract Anaphylaxis  . Tape Anaphylaxis    adhesvie tape-rash, blisters  . Venomil Mixed Vespid  [Mixed Vespid Venom] Anaphylaxis  . Percocet [Oxycodone-Acetaminophen] Nausea Only    Post surgery    Social History   Social History Narrative   Married. 2 kids-son and daughter.       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 Oropharynx normal Neck: no thyromegaly CV: RRR no murmurs rubs or gallops Lungs: CTAB no crackles, wheeze, rhonchi Abdomen: soft/nontender/nondistended/normal bowel sounds. No rebound or guarding.  Ext: no edema Skin: warm, dry Neuro: grossly normal, moves all extremities, PERRLA  No rectal exam  EKG:  sinus bradycardia with rate 55, normal axis, normal intervals, no hypertrophy, no st or t wave chages   Assessment/Plan:  52 y.o. male presenting for annual physical.  Health Maintenance counseling: 1. Anticipatory guidance: Patient counseled regarding regular dental exams -q6 months, eye exams -yearly- just had visit, wearing seatbelts.  2. Risk factor reduction:  Advised  patient of need for regular exercise and diet rich and fruits and vegetables to reduce risk of heart attack and stroke. Exercise- 3x a week runing- getting in 20 miles. Diet-down 5 lbs from last visit- he thinks surgeries contributed.  Wt Readings from Last 3 Encounters:  07/29/18 198 lb (89.8 kg)  03/25/18 203 lb (92.1 kg)  03/15/18 203 lb (92.1 kg)  3. Immunizations/screenings/ancillary studies- Shingrix #1 today. Repeat injection in 2-3 months. Schedule this before you leave. Does flu shot with work- will send [place] a message.  Immunization History  Administered Date(s) Administered  . Influenza,inj,Quad PF,6+ Mos 08/17/2013  . Influenza-Unspecified 11/02/2015, 08/22/2016, 08/31/2017  . Td 11/17/2001  . Tdap 02/02/2012  4. Prostate cancer screening- Sees Dr. Alinda Money-  follows with urology due to prostate cancer history- PSA trending down post prostatectomy - will not repeat at this time Lab Results  Component Value Date   PSA 0.015 06/25/2018   PSA 5.82 01/22/2018   PSA 4.38 (H) 07/07/2017   5. Colon cancer screening - 09/10/16 with 10 year follow up 6. Skin cancer screening- intermittent derm visits. advised regular sunscreen use. Denies worrisome, changing, or new skin lesions.  73. Former smoker- quit 1988 and only 1.5 pack years. Gets UA with urolgoy  Status of chronic or acute concerns   Big year with prostate surgery and deviated septum surgery- healing well.   Hyperglycemia- mild cbg elevation in past- not fasting today-  will get a1c as not fasting  Asthma- still followed by [facility]- q3 weeks.  Has history of anaphylaxis with bee stings and carries epi pens. They also manage his allergic rhinitis.   Eczema- Continued foot blisters intermittently but resolve on their own. Sensitive skin- flared up with topical antifungal on skin. Plantar fasciitis has remained at [place] lately.   History shingles 2 years ago- will give shingrix today  Migraines- decreased since nasal sinus surgery. Less frequent less severe. About 2 a month right now.   Hyperlipidemia has been well controlled on atorvastatin 54m. Also on aspirin. Strong family history- premature MI in father (In much better health than other family members). Update lipids. With family history wants EKG.he wants to consider coronary CT next year after discussion.   Hypothyroidism tends to get smell of smoke when TSH is off and having some now- we will get updated TSH. On levothyroxine 100 mcg  Prostate cancer ([place] Advanced to 5+4= 9 and prostatectomy Dr. BAlinda MoneyPrior [facility]. 3 cores it appears. 4+3=7  Return in about 1 year (around 07/30/2019) for physical.  Lab/Order associations: NOT fasting Preventative health care - Plan: CBC with Differential/Platelet, Comprehensive metabolic panel, Lipid panel, TSH, Hemoglobin A1c, EKG 12-Lead  Hyperlipidemia, unspecified hyperlipidemia type - Plan: CBC with Differential/Platelet, Comprehensive metabolic panel, Lipid panel, TSH, EKG 12-Lead  Hypothyroidism, unspecified type - Plan: TSH  Migraine without aura and without status migrainosus, not intractable  Hyperglycemia - Plan: Hemoglobin A1c  Family history of coronary arteriosclerosis - Plan: EKG 12-Lead  Prostate cancer ([place]  Return precautions advised.  SGarret Reddish MD

## 2018-07-31 ENCOUNTER — Other Ambulatory Visit: Payer: Self-pay | Admitting: Family Medicine

## 2018-08-31 ENCOUNTER — Encounter: Payer: Self-pay | Admitting: Family Medicine

## 2018-10-01 ENCOUNTER — Ambulatory Visit: Payer: BLUE CROSS/BLUE SHIELD

## 2018-10-11 DIAGNOSIS — J019 Acute sinusitis, unspecified: Secondary | ICD-10-CM | POA: Insufficient documentation

## 2018-10-12 ENCOUNTER — Ambulatory Visit (INDEPENDENT_AMBULATORY_CARE_PROVIDER_SITE_OTHER): Payer: BLUE CROSS/BLUE SHIELD

## 2018-10-12 DIAGNOSIS — Z23 Encounter for immunization: Secondary | ICD-10-CM | POA: Diagnosis not present

## 2018-10-12 NOTE — Progress Notes (Signed)
Per orders of Dr. Yong Channel , injection of Shingrix # 2 given by Francella Solian in to right deltoid.  Patient tolerated injection well. No questions at this time.

## 2018-12-05 ENCOUNTER — Other Ambulatory Visit: Payer: Self-pay | Admitting: Family Medicine

## 2019-03-30 MED FILL — ATORVASTATIN 20 MG TABLET: 20 | 90 days supply | Qty: 90 | Fill #0

## 2019-03-30 MED FILL — LEVOTHYROXINE 100 MCG TAB: 100 | 90 days supply | Qty: 90 | Fill #0

## 2019-06-20 MED FILL — LEVOTHYROXINE 100 MCG TAB: 100 | 60 days supply | Qty: 60 | Fill #1

## 2019-06-20 MED FILL — ATORVASTATIN 20 MG TABLET: 20 | 60 days supply | Qty: 60 | Fill #1

## 2019-08-02 ENCOUNTER — Other Ambulatory Visit: Payer: Self-pay

## 2019-08-02 ENCOUNTER — Encounter: Payer: Self-pay | Admitting: Family Medicine

## 2019-08-02 ENCOUNTER — Ambulatory Visit (INDEPENDENT_AMBULATORY_CARE_PROVIDER_SITE_OTHER): Payer: No Typology Code available for payment source | Admitting: Family Medicine

## 2019-08-02 VITALS — BP 112/72 | HR 54 | Temp 98.3°F | Ht 71.0 in | Wt 199.4 lb

## 2019-08-02 DIAGNOSIS — Z0000 Encounter for general adult medical examination without abnormal findings: Secondary | ICD-10-CM

## 2019-08-02 DIAGNOSIS — E785 Hyperlipidemia, unspecified: Secondary | ICD-10-CM

## 2019-08-02 DIAGNOSIS — Z23 Encounter for immunization: Secondary | ICD-10-CM

## 2019-08-02 DIAGNOSIS — R739 Hyperglycemia, unspecified: Secondary | ICD-10-CM | POA: Diagnosis not present

## 2019-08-02 DIAGNOSIS — Z8546 Personal history of malignant neoplasm of prostate: Secondary | ICD-10-CM

## 2019-08-02 DIAGNOSIS — E039 Hypothyroidism, unspecified: Secondary | ICD-10-CM | POA: Diagnosis not present

## 2019-08-02 DIAGNOSIS — J4520 Mild intermittent asthma, uncomplicated: Secondary | ICD-10-CM | POA: Diagnosis not present

## 2019-08-02 LAB — CBC
HCT: 43.7 % (ref 39.0–52.0)
Hemoglobin: 15 g/dL (ref 13.0–17.0)
MCHC: 34.4 g/dL (ref 30.0–36.0)
MCV: 90.4 fl (ref 78.0–100.0)
Platelets: 300 10*3/uL (ref 150.0–400.0)
RBC: 4.83 Mil/uL (ref 4.22–5.81)
RDW: 12.5 % (ref 11.5–15.5)
WBC: 8.3 10*3/uL (ref 4.0–10.5)

## 2019-08-02 LAB — COMPREHENSIVE METABOLIC PANEL
ALT: 42 U/L (ref 0–53)
AST: 24 U/L (ref 0–37)
Albumin: 4.3 g/dL (ref 3.5–5.2)
Alkaline Phosphatase: 70 U/L (ref 39–117)
BUN: 18 mg/dL (ref 6–23)
CO2: 30 mEq/L (ref 19–32)
Calcium: 9.5 mg/dL (ref 8.4–10.5)
Chloride: 103 mEq/L (ref 96–112)
Creatinine, Ser: 1.31 mg/dL (ref 0.40–1.50)
GFR: 57.24 mL/min — ABNORMAL LOW (ref >60.00)
Glucose, Bld: 90 mg/dL (ref 70–99)
Potassium: 4.4 mEq/L (ref 3.5–5.1)
Sodium: 139 mEq/L (ref 135–145)
Total Bilirubin: 0.6 mg/dL (ref 0.2–1.2)
Total Protein: 6.4 g/dL (ref 6.0–8.3)

## 2019-08-02 LAB — LIPID PANEL
Cholesterol: 156 mg/dL (ref 0–200)
HDL: 44.5 mg/dL (ref >39.00)
NonHDL: 111.39
Total CHOL/HDL Ratio: 4
Triglycerides: 234 mg/dL — ABNORMAL HIGH (ref 0.0–149.0)
VLDL: 46.8 mg/dL — ABNORMAL HIGH (ref 0.0–40.0)

## 2019-08-02 LAB — LDL CHOLESTEROL, DIRECT: Direct LDL: 88 mg/dL

## 2019-08-02 LAB — HEMOGLOBIN A1C: Hgb A1c MFr Bld: 5.8 % (ref 4.6–6.5)

## 2019-08-02 LAB — TSH: TSH: 2.64 u[IU]/mL (ref 0.35–4.50)

## 2019-08-02 MED ORDER — LEVOTHYROXINE SODIUM 100 MCG PO TABS: 100.0000 ug | ORAL_TABLET | Freq: Every day | ORAL | 3 refills | Status: DC

## 2019-08-02 MED ORDER — ATORVASTATIN CALCIUM 20 MG PO TABS: 20.0000 mg | ORAL_TABLET | Freq: Every day | ORAL | 3 refills | Status: DC

## 2019-08-02 MED ORDER — RIZATRIPTAN BENZOATE 10 MG PO TBDP: ORAL_TABLET | ORAL | 5 refills | Status: DC

## 2019-08-02 MED FILL — RIZATRIPTAN 10 MG ODT: 10 | 30 days supply | Qty: 12 | Fill #0

## 2019-08-02 NOTE — Patient Instructions (Addendum)
Health Maintenance Due  Topic Date Due  . INFLUENZA VACCINE - today 06/18/2019   No changes today   Please stop by lab before you go If you do not have mychart- we will call you about results within 5 business days of [place] receiving them.  If you have mychart- we will send your results within 3 business days of [place] receiving them.  If abnormal or we want to clarify a result, we will call or mychart you to make sure you receive the message.  If you have questions or concerns or don't hear within 5-7 days, please send [place] a message or call us.    Immunization History  Administered Date(s) Administered  . Influenza,inj,Quad PF,6+ Mos 08/17/2013  . Influenza-Unspecified 11/02/2015, 08/22/2016, 08/31/2017, 08/30/2018  . Td 11/17/2001  . Tdap 02/02/2012  . Zoster Recombinat (Shingrix) 07/29/2018, 10/12/2018

## 2019-08-02 NOTE — Progress Notes (Signed)
Phone: [phone]   Subjective:  Patient presents today for their annual physical. Chief complaint-noted.   See problem oriented charting- ROS- full  review of systems was completed and negative  except for: occasional knee pain  The following were reviewed and entered/updated in epic: Past Medical History:  Diagnosis Date  . Allergy    venom anaphylaxis  . Asthma    extrinsic  . 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  . Complication of anesthesia    slow to wake up   . Family history of breast cancer   . Family history of melanoma   . Family history of parotid cancer   . GERD (gastroesophageal reflux disease)   . Headache(784.0)    hx of migraines   . History of kidney stones   . Hyperlipidemia   . Hypothyroidism   . Nephrolithiasis     X 8,Dr Tannebaum  . PONV (postoperative nausea and vomiting)   . Prostate cancer ([place])   . Tuberculosis    hx of ox 1    Patient Active Problem List   Diagnosis Date Noted  . History of prostate cancer 01/26/2018    Priority: High  . Hypothyroidism 07/06/2015    Priority: Medium  . 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 Hyperlipidemia 01/12/2009    Priority: Medium  . Asthma 01/12/2009    Priority: Medium  . Common migraine 01/12/2009    Priority: Medium  . Genetic testing 02/26/2018    Priority: Low  . Family history of breast cancer     Priority: Low  . Family history of melanoma     Priority: Low  . Family history of parotid cancer     Priority: Low  . Plantar fasciitis of right foot 01/17/2018    Priority: Low  . Eczema 01/11/2016    Priority: Low  . Snoring 02/23/2015    Priority: Low  . TINEA PEDIS 09/25/2009    Priority: Low  . Allergic rhinitis 01/12/2009    Priority: Low  . GERD 01/12/2009    Priority: Low  . NEPHROLITHIASIS, HX OF 01/12/2009    Priority: Low   Past Surgical History:  Procedure Laterality Date  . ELBOW SURGERY     Tommy Edgardo's  . KNEE ARTHROSCOPY  2011   R knee; Dr Maureen Ralphs  .  LUMBAR FUSION  2004   Dr Trenton Gammon  . LYMPHADENECTOMY Bilateral 03/25/2018   Procedure: LYMPHADENECTOMY, PELVIC;  Surgeon: Raynelle Bring, MD;  Location: [facility];  Service: Urology;  Laterality: Bilateral;  . MICRODISCECTOMY LUMBAR  1999   L4-5,L5-S1;Dr. Trenton Gammon  . NASAL SEPTOPLASTY W/ TURBINOPLASTY    . ROBOT ASSISTED LAPAROSCOPIC RADICAL PROSTATECTOMY N/A 03/25/2018   Procedure: XI ROBOTIC ASSISTED LAPAROSCOPIC RADICAL PROSTATECTOMY LEVEL 2;  Surgeon: Raynelle Bring, MD;  Location: [facility];  Service: Urology;  Laterality: N/A;  . TONSILLECTOMY    . VASECTOMY  2000    Family History  Problem Relation Age of Onset  . Heart attack Father 35       died @ 69  . Thyroid disease Mother   . Heart attack Brother 60       1/2 brother  . Crohn's disease Brother   . Hypertension Paternal Grandmother   . Heart disease Paternal Grandmother        MI in 43s  . Diabetes Paternal Grandmother   . Heart attack Paternal Grandfather 68  . Heart attack Paternal Aunt 57  . Breast cancer Sister 22  .  Melanoma Maternal Aunt   . Cancer Maternal Grandfather        parotid mixed tumor- met lung  . Colon cancer Neg Hx   . Esophageal cancer Neg Hx   . Pancreatic cancer Neg Hx   . Prostate cancer Neg Hx   . Rectal cancer Neg Hx   . Stomach cancer Neg Hx     Medications- reviewed and updated Current Outpatient Medications  Medication Sig Dispense Refill  . Albuterol Sulfate (PROAIR RESPICLICK) 496 (90 Base) MCG/ACT AEPB Inhale 2 puffs into the lungs every 6 (six) hours as needed (shortness of breath).    [name] atorvastatin (LIPITOR) 20 MG tablet Take 1 tablet (20 mg total) by mouth daily. 90 tablet 3  . EPINEPHrine (AUVI-Q) 0.3 mg/0.3 mL IJ SOAJ injection Inject 0.3 mg into the muscle once.    . famotidine (PEPCID) 20 MG tablet Take 20 mg by mouth daily.    . fexofenadine (ALLEGRA) 180 MG tablet Take 180 mg by mouth daily.     . fluticasone (FLONASE) 50 MCG/ACT nasal spray Place 1 spray into the nose at bedtime.      [name] levothyroxine (SYNTHROID) 100 MCG tablet Take 1 tablet (100 mcg total) by mouth daily before breakfast. 90 tablet 3  . rizatriptan (MAXALT-MLT) 10 MG disintegrating tablet TAKE 1 TABLET BY MOUTH EVERY DAY AS NEEDED FOR MIGRAINE. DISSOLVE IN MOUTH 12 tablet 5  . sildenafil (REVATIO) 20 MG tablet Take 20-100 mg by mouth at bedtime.    [name] oxyCODONE-acetaminophen (PERCOCET) 10-325 MG tablet Take 1 tablet by mouth every 6 (six) hours as needed for pain (migraine.). (Patient not taking: Reported on 08/02/2019) 20 tablet 0   No current facility-administered medications for this visit.     Allergies-reviewed and updated Allergies  Allergen Reactions  . Banana Anaphylaxis  . Bee Venom Anaphylaxis    Also venom from wasps & hornets cause anaphylaxis; he carries Epi-Pen & has Medi Alert bracelet  . Neomycin Sulfate Anaphylaxis    Anaphylactic shock  Because of a history of documented adverse serious drug reaction;Medi Alert bracelet  is recommended  . Strawberry Extract Anaphylaxis  . Tape Anaphylaxis    adhesvie tape-rash, blisters  . Venomil Mixed Vespid  [Mixed Vespid Venom] Anaphylaxis  . Percocet [Oxycodone-Acetaminophen] Nausea Only    Post surgery    Social History   Social History Narrative   Married. 2 kids-son and daughter. Both [place] in 2020- son graduating 2020- Runner, broadcasting/film/video.       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 19 scouts   Objective  Objective:  BP 112/72 (BP Location: Left Arm, Patient Position: Sitting, Cuff Size: Normal)   Pulse (!) 54   Temp 98.3 F (36.8 C) (Temporal)   Ht 5' 11"  (1.803 m)   Wt 199 lb 6.4 oz (90.4 kg)   SpO2 98%   BMI 27.81 kg/m  Gen: NAD, resting comfortably HEENT: Mucous membranes are moist. Oropharynx normal Neck: no thyromegaly CV: RRR no murmurs rubs or gallops Lungs: CTAB no crackles, wheeze, rhonchi Abdomen:  soft/nontender/nondistended/normal bowel sounds. No rebound or guarding.  Ext: no edema Skin: warm, dry Neuro: grossly normal, moves all extremities, PERRLA   Assessment and Plan  53 y.o. male presenting for annual physical.  Health Maintenance counseling: 1. Anticipatory guidance: Patient counseled regarding regular dental exams -q6 months, eye exams -yearly,  avoiding smoking and second hand smoke ,  limiting alcohol to 2 beverages per day .   2. Risk factor reduction:  Advised patient of need for regular exercise and diet rich and fruits and vegetables to reduce risk of heart attack and stroke. Exercise-golf once a week, runs or walks 5 x a week, trying to get back into gym after opening back up with covid 19 . Diet-doing blue apron- trying to eat reasonably healthy diet- has been better since retirement and not eating out as much- feels like could help with portion control.  Weight stable from last year Wt Readings from Last 3 Encounters:  08/02/19 199 lb 6.4 oz (90.4 kg)  07/29/18 198 lb (89.8 kg)  03/25/18 203 lb (92.1 kg)  3. Immunizations/screenings/ancillary studies-flu shot today-otherwise up-to-date Immunization History  Administered Date(s) Administered  . Influenza,inj,Quad PF,6+ Mos 08/17/2013  . Influenza-Unspecified 11/02/2015, 08/22/2016, 08/31/2017, 08/30/2018  . Td 11/17/2001  . Tdap 02/02/2012  . Zoster Recombinat (Shingrix) 07/29/2018, 10/12/2018  4. Prostate cancer follow-up- history of prostatectomy with Dr. Alinda Money.  Gleason was 5+4 = 9.  PSAs trended down significantly after surgery.  PSA was undetectable per report from February visit. Lab Results  Component Value Date   PSA 0.015 06/25/2018   PSA 5.82 01/22/2018   PSA 4.38 (H) 07/07/2017   5. Colon cancer screening - colonoscopy 09/10/2016 with 10-year follow-up 6. Skin cancer screening- not seeing dermatology. advised regular sunscreen use. Denies worrisome, changing, or new skin lesions.  7.  Former smoker-1.5  pack years quit in 1980s.  No regular screening planned other than AAA screen at 69  Status of chronic or acute concerns   2019- had been year dealing with prostate cancer and deviated septum.   Migraines - Taking Rizatriptan 10 mg prn.  2-3 per week recently with sinuses bothering him recently- will continue to work with allergiest. Advised limiting to 2 a week.   Allergic Rhinitis - Taking Allegra and Flonase prn.  Follows with allergist due to history of anaphylaxis- also gets allergy shots.  Anaphylaxis- doing venom immunotherapy- hope is this reduces risk of hospitalization but he still carries epi pen  Asthma - Albuterol HFA prn.  Uses less than twice a week   GERD - Taking Pepcid-reasonable control   Hypothyroidism - Taking Levothyroxine 100 mcg daily. Update TSH today  Hyperlipidemia - Taking Atorvastatin 20 mg daily.  Update lipid panel with labs  He also takes aspirin due to strong family history with premature MI in father.  He also would like to consider coronary CT-last year's EKG was reassuring- wants to hold off for now  Hyperglycemia-mild elevation in A1c in the past-update A1c today.  Continue efforts for healthy eating and regular exercise Lab Results  Component Value Date   HGBA1C 5.8 07/29/2018    Eczema/sensitive skin- no major issues recently.    Also has history of plantar fasciitis with no recent issues  Recommended follow up: 1 year physical  Lab/Order associations: Not fasting today   ICD-10-CM   1. Preventative health care  Z00.00 TSH    CBC    Comprehensive metabolic panel    Lipid panel    Hemoglobin A1c  2. Hypothyroidism, unspecified type  E03.9 TSH  3. Hyperlipidemia, unspecified hyperlipidemia type  E78.5 CBC    Comprehensive metabolic panel    Lipid panel  4. Mild intermittent asthma without complication  O27.74   5. History of prostate cancer  Z85.46 Ambulatory referral to Urology  6. Hyperglycemia  R73.9 Hemoglobin A1c    Meds ordered  this encounter  Medications  . rizatriptan (MAXALT-MLT) 10 MG disintegrating tablet    Sig: TAKE 1 TABLET BY MOUTH EVERY DAY AS NEEDED FOR MIGRAINE. DISSOLVE IN MOUTH    Dispense:  12 tablet    Refill:  5  . atorvastatin (LIPITOR) 20 MG tablet    Sig: Take 1 tablet (20 mg total) by mouth daily.    Dispense:  90 tablet    Refill:  3  . levothyroxine (SYNTHROID) 100 MCG tablet    Sig: Take 1 tablet (100 mcg total) by mouth daily before breakfast.    Dispense:  90 tablet    Refill:  3   Return precautions advised.  Garret Reddish, MD

## 2019-08-02 NOTE — Addendum Note (Signed)
Addended by: Jasper Loser on: 08/02/2019 02:23 PM   Modules accepted: Orders

## 2019-08-03 ENCOUNTER — Encounter: Payer: BLUE CROSS/BLUE SHIELD | Admitting: Family Medicine

## 2019-08-19 MED FILL — LEVOTHYROXINE 100 MCG TABLE: 100 | 90 days supply | Qty: 90 | Fill #0

## 2019-08-19 MED FILL — ATORVASTATIN 20 MG TABLET: 20 | 90 days supply | Qty: 90 | Fill #0

## 2019-08-19 MED FILL — RIZATRIPTAN 10 MG ODT: 10 | 30 days supply | Qty: 12 | Fill #0

## 2019-09-22 ENCOUNTER — Encounter: Payer: Self-pay | Admitting: Family Medicine

## 2019-09-23 ENCOUNTER — Other Ambulatory Visit: Payer: Self-pay

## 2019-09-23 DIAGNOSIS — G589 Mononeuropathy, unspecified: Secondary | ICD-10-CM

## 2019-09-29 ENCOUNTER — Other Ambulatory Visit: Payer: Self-pay

## 2019-09-29 ENCOUNTER — Ambulatory Visit: Payer: Self-pay

## 2019-09-29 ENCOUNTER — Ambulatory Visit (INDEPENDENT_AMBULATORY_CARE_PROVIDER_SITE_OTHER): Payer: No Typology Code available for payment source | Admitting: Family Medicine

## 2019-09-29 ENCOUNTER — Encounter: Payer: Self-pay | Admitting: Family Medicine

## 2019-09-29 DIAGNOSIS — M25511 Pain in right shoulder: Secondary | ICD-10-CM

## 2019-09-29 DIAGNOSIS — R202 Paresthesia of skin: Secondary | ICD-10-CM | POA: Diagnosis not present

## 2019-09-29 DIAGNOSIS — R200 Anesthesia of skin: Secondary | ICD-10-CM | POA: Diagnosis not present

## 2019-09-29 MED ORDER — PREDNISONE 10 MG PO TABS: ORAL_TABLET | ORAL | 0 refills | Status: DC

## 2019-09-29 MED ORDER — TIZANIDINE HCL 2 MG PO TABS: 2.0000 mg | ORAL_TABLET | Freq: Every evening | ORAL | 1 refills | Status: DC | PRN

## 2019-09-29 MED FILL — tiZANidine HCL 2 MG TABS: 2 | 30 days supply | Qty: 60 | Fill #0

## 2019-09-29 MED FILL — predniSONE 10 MG TABS: 10 | 12 days supply | Qty: 42 | Fill #0

## 2019-09-29 NOTE — Progress Notes (Signed)
Office Visit Note   Patient: Aaron Gomez           Date of Birth: 07-06-1966           MRN: [id number] Visit Date: 09/29/2019 Requested by: Marin Olp, MD [place],  [place] [id number] PCP: Marin Olp, MD  Subjective: Chief Complaint  Patient presents with  . Right Shoulder - Pain    Pain between scapula and spine x 2 months - "a stinger." Pain has started to radiate down the arm to the hand  - hand tingles. Difficulty sleeping at night. Ambidextrous (writes with right, all else with left hand).    HPI: He is here with right arm pain.  About 2 months ago he woke up with pain in the shoulder blade area.  He has had trigger points before and he tried to work it out with no improvement.  His pain has progressed to radiating down the arm with tingling in the hand intermittently.  The pain keeps him from sleeping at night.  He is ambidextrous.  He has a history of lumbar disc problems requiring surgery.  He feels like he has a pinched nerve this time in the neck.  He went to a masseuse recently with only temporary relief.              ROS: No fever or chills.  All other systems were reviewed and are negative.  Objective: Vital Signs: There were no vitals taken for this visit.  Physical Exam:  General:  Alert and oriented, in no acute distress. Pulm:  Breathing unlabored. Psy:  Normal mood, congruent affect. Skin: No rash. Neck: Good range of motion, negative Spurling's test.  Upper extremity strength and reflexes are normal.  He does have a positive Phalen's test in the right wrist.   Right shoulder: Tender trigger point in the rhomboid area.  Imaging: X-ray cervical spine: He has moderate C5-6 and C6-7 degenerative disc disease with uncovertebral DJD.  No sign of compression deformity or neoplasm.    Assessment & Plan: 1.  Right arm pain, suspect cervical radiculopathy. -Prednisone taper, Zanaflex as needed, physical therapy referral.  MRI followed by  epidural injections if he fails to improve.     Procedures: No procedures performed  No notes on file     PMFS History: Patient Active Problem List   Diagnosis Date Noted  . Acute non-recurrent sinusitis 10/11/2018  . Genetic testing 02/26/2018  . Family history of breast cancer   . Family history of melanoma   . Family history of parotid cancer   . History of prostate cancer 01/26/2018  . Plantar fasciitis of right foot 01/17/2018  . Chronic nonintractable headache 10/19/2017  . Deviated septum 10/19/2017  . Nasal turbinate hypertrophy 10/19/2017  . Eczema 01/11/2016  . Hypothyroidism 07/06/2015  . Snoring 02/23/2015  . 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 TINEA PEDIS 09/25/2009  . Hyperlipidemia 01/12/2009  . Allergic rhinitis 01/12/2009  . Asthma 01/12/2009  . GERD 01/12/2009  . Common migraine 01/12/2009  . NEPHROLITHIASIS, HX OF 01/12/2009   Past Medical History:  Diagnosis Date  . Allergy    venom anaphylaxis  . Asthma    extrinsic  . 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  . Complication of anesthesia    slow to wake up   . Family history of breast cancer   . Family history of melanoma   . Family history of parotid cancer   . GERD (  gastroesophageal reflux disease)   . Headache(784.0)    hx of migraines   . History of kidney stones   . Hyperlipidemia   . Hypothyroidism   . Nephrolithiasis     X 8,Dr Tannebaum  . PONV (postoperative nausea and vomiting)   . Prostate cancer ([place])   . Tuberculosis    hx of ox 1     Family History  Problem Relation Age of Onset  . Heart attack Father 54       died @ 33  . Thyroid disease Mother   . Heart attack Brother 45       1/2 brother  . Crohn's disease Brother   . Hypertension Paternal Grandmother   . Heart disease Paternal Grandmother        MI in 82s  . Diabetes Paternal Grandmother   . Heart attack Paternal Grandfather 81  . Heart attack Paternal Aunt 68  . Breast cancer Sister 78  . Melanoma Maternal Aunt   .  Cancer Maternal Grandfather        parotid mixed tumor- met lung  . Colon cancer Neg Hx   . Esophageal cancer Neg Hx   . Pancreatic cancer Neg Hx   . Prostate cancer Neg Hx   . Rectal cancer Neg Hx   . Stomach cancer Neg Hx     Past Surgical History:  Procedure Laterality Date  . ELBOW SURGERY     Tommy Savalas's  . KNEE ARTHROSCOPY  2011   R knee; Dr Maureen Ralphs  . LUMBAR FUSION  2004   Dr Trenton Gammon  . LYMPHADENECTOMY Bilateral 03/25/2018   Procedure: LYMPHADENECTOMY, PELVIC;  Surgeon: Raynelle Bring, MD;  Location: [facility];  Service: Urology;  Laterality: Bilateral;  . MICRODISCECTOMY LUMBAR  1999   L4-5,L5-S1;Dr. Trenton Gammon  . NASAL SEPTOPLASTY W/ TURBINOPLASTY    . ROBOT ASSISTED LAPAROSCOPIC RADICAL PROSTATECTOMY N/A 03/25/2018   Procedure: XI ROBOTIC ASSISTED LAPAROSCOPIC RADICAL PROSTATECTOMY LEVEL 2;  Surgeon: Raynelle Bring, MD;  Location: [facility];  Service: Urology;  Laterality: N/A;  . TONSILLECTOMY    . VASECTOMY  2000   Social History   Occupational History  . Not on file  Tobacco Use  . Smoking status: Former Smoker    Packs/day: 0.50    Years: 3.00    Pack years: 1.50    Types: Cigarettes    Quit date: 11/17/1986    Years since quitting: 32.8  . Smokeless tobacco: Never Used  . Tobacco comment: smoked 1980-1988, up to 1 ppd  Substance and Sexual Activity  . Alcohol use: Yes    Comment: periodic   . Drug use: No  . Sexual activity: Yes

## 2019-10-10 ENCOUNTER — Encounter: Payer: Self-pay | Admitting: Family Medicine

## 2019-10-10 DIAGNOSIS — M542 Cervicalgia: Secondary | ICD-10-CM

## 2019-10-10 MED ORDER — TRAMADOL HCL 50 MG PO TABS: 50.0000 mg | ORAL_TABLET | Freq: Four times a day (QID) | ORAL | 0 refills | Status: DC | PRN

## 2019-10-10 MED ORDER — BACLOFEN 10 MG PO TABS: 10.0000 mg | ORAL_TABLET | Freq: Three times a day (TID) | ORAL | 3 refills | Status: DC | PRN

## 2019-10-10 MED ORDER — NABUMETONE 750 MG PO TABS: 750.0000 mg | ORAL_TABLET | Freq: Two times a day (BID) | ORAL | 6 refills | Status: DC | PRN

## 2019-10-10 MED FILL — NABUMETONE 750 MG TABS: 750 | 30 days supply | Qty: 60 | Fill #0

## 2019-10-10 MED FILL — traMADol HCL 50 MG TABS: 50 | 7 days supply | Qty: 30 | Fill #0

## 2019-10-10 MED FILL — BACLOFEN 10 MG TABS: 10 | 10 days supply | Qty: 60 | Fill #0

## 2019-10-10 NOTE — Addendum Note (Signed)
Addended by: Hortencia Pilar on: 10/10/2019 01:38 PM   Modules accepted: Orders

## 2019-10-17 ENCOUNTER — Other Ambulatory Visit: Payer: Self-pay

## 2019-10-18 ENCOUNTER — Ambulatory Visit (INDEPENDENT_AMBULATORY_CARE_PROVIDER_SITE_OTHER): Payer: No Typology Code available for payment source | Admitting: Physical Therapy

## 2019-10-18 ENCOUNTER — Encounter: Payer: Self-pay | Admitting: Physical Therapy

## 2019-10-18 DIAGNOSIS — M5412 Radiculopathy, cervical region: Secondary | ICD-10-CM

## 2019-10-18 DIAGNOSIS — M79601 Pain in right arm: Secondary | ICD-10-CM | POA: Diagnosis not present

## 2019-10-18 NOTE — Patient Instructions (Signed)
Access Code: [email or web address]  URL: [email or web address]  Date: 10/18/2019  Prepared by: [name]   Exercises Seated Cervical Sidebending Stretch - 3 reps - 30 hold - 2x daily Supine Static Chest Stretch on Foam Roll - 2 reps - 30 hold - 2x daily Seated Cervical Retraction - 10 reps - 1 sets - 2x daily

## 2019-10-19 ENCOUNTER — Other Ambulatory Visit: Payer: Self-pay

## 2019-10-19 ENCOUNTER — Encounter: Payer: Self-pay | Admitting: Physical Therapy

## 2019-10-19 NOTE — Therapy (Signed)
[facility] [address] [place], [place], [address] Phone: [phone]   Fax:  [phone]  Physical Therapy Evaluation  Patient Details  Name: Aaron Gomez MRN: [id number] Date of Birth: 05/11/1966 Referring Provider (PT): Hilts   Encounter Date: 10/18/2019  PT End of Session - 10/18/19 1110    Visit Number  1    Number of Visits  12    Date for PT Re-Evaluation  11/29/19    Authorization Type  [facility]    PT Start Time  1016    PT Stop Time  1055    PT Time Calculation (min)  39 min    Activity Tolerance  Patient tolerated treatment well;Patient limited by pain    Behavior During Therapy  [facility] for tasks assessed/performed       Past Medical History:  Diagnosis Date  . Allergy    venom anaphylaxis  . Asthma    extrinsic  . 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  . Complication of anesthesia    slow to wake up   . Family history of breast cancer   . Family history of melanoma   . Family history of parotid cancer   . GERD (gastroesophageal reflux disease)   . Headache(784.0)    hx of migraines   . History of kidney stones   . Hyperlipidemia   . Hypothyroidism   . Nephrolithiasis     X 8,Dr Tannebaum  . PONV (postoperative nausea and vomiting)   . Prostate cancer ([place])   . Tuberculosis    hx of ox 1     Past Surgical History:  Procedure Laterality Date  . ELBOW SURGERY     Tommy Pavan's  . KNEE ARTHROSCOPY  2011   R knee; Dr Maureen Ralphs  . LUMBAR FUSION  2004   Dr Trenton Gammon  . LYMPHADENECTOMY Bilateral 03/25/2018   Procedure: LYMPHADENECTOMY, PELVIC;  Surgeon: Raynelle Bring, MD;  Location: [facility];  Service: Urology;  Laterality: Bilateral;  . MICRODISCECTOMY LUMBAR  1999   L4-5,L5-S1;Dr. Trenton Gammon  . NASAL SEPTOPLASTY W/ TURBINOPLASTY    . ROBOT ASSISTED LAPAROSCOPIC RADICAL PROSTATECTOMY N/A 03/25/2018   Procedure: XI ROBOTIC ASSISTED LAPAROSCOPIC RADICAL PROSTATECTOMY LEVEL 2;  Surgeon: Raynelle Bring, MD;  Location: [facility];   Service: Urology;  Laterality: N/A;  . TONSILLECTOMY    . VASECTOMY  2000    There were no vitals filed for this visit.   Subjective Assessment - 10/18/19 1108    Subjective  Pt with signfiicant discomfort in R arm, from shoulder to hand. States numbness and tingling into all fingers, constant, has not been able to find position of comfort. No injury to report. Pain started at R levator region with "knot", that pain is now better. Is retired, active at home. Had previous elbow surgery about 6 yrs ago with good outcome. States minimal relief from prednisone    Limitations  Sitting;Reading;Standing;Writing;Walking;House hold activities    Patient Stated Goals  Decreased pain/radicular pain in arm    Currently in Pain?  Yes    Pain Score  8     Pain Location  Arm    Pain Orientation  Right    Pain Descriptors / Indicators  Tingling;Burning;Pins and needles    Pain Type  Acute pain    Pain Onset  More than a month ago    Pain Frequency  Intermittent    Aggravating Factors   none stated, hurts constantly    Pain Relieving  Factors  meds, some relief.         [facility] PT Assessment - 10/19/19 0001      Assessment   Medical Diagnosis  Acute pain of R shoulder/ Radiculopathy R Arm    Referring Provider (PT)  Hilts    Hand Dominance  Right    Prior Therapy  no      Balance Screen   Has the patient fallen in the past 6 months  No      Prior Function   Level of Independence  Independent      Cognition   Overall Cognitive Status  Within Functional Limits for tasks assessed      ROM / Strength   AROM / PROM / Strength  AROM;Strength      AROM   Overall AROM Comments  Cervical: WNL, mild limitation with L SB due to pain in arm.       Strength   Overall Strength Comments  R shoulder: 4+/5, Elbow: 5/5, Wrist: 5/5     Strength Assessment Site  Hand    Right/Left hand  --      Palpation   Palpation comment  Mild tightness in R UT and levator, no painful trigger points felt in R  rhomobid today (states previous pain here)       Special Tests   Other special tests  [place]- does not increase symptoms today, states tingling in arm, constant,  Manual traction today, minimal relief  of radiculopathy                Objective measurements completed on examination: See above findings.      [facility] Adult PT Treatment/Exercise - 10/19/19 0001      Exercises   Exercises  Neck      Manual Therapy   Manual Therapy  Joint mobilization;Soft tissue mobilization;Passive ROM;Manual Traction    Joint Mobilization  PA mobs, side glides, c-spine     Manual Traction  10 sec x10 cervical      Neck Exercises: Stretches   Upper Trapezius Stretch  2 reps;30 seconds    Upper Trapezius Stretch Limitations  on L .     Corner Stretch  2 reps;30 seconds    Corner Stretch Limitations  90 deg at doorway     Other Neck Stretches  Supine pec stretch with nerve glide x10 (increased discomfort)              PT Education - 10/18/19 1109    Education Details  PT POC, initial HEP, Exam findings.    Person(s) Educated  Patient    Methods  Explanation;Demonstration;Handout    Comprehension  Verbalized understanding;Returned demonstration;Need further instruction       PT Short Term Goals - 10/19/19 1005      PT SHORT TERM GOAL #1   Title  Pt to be independent with initial HEP    Time  2    Period  Weeks    Status  New    Target Date  11/01/19      PT SHORT TERM GOAL #2   Title  Pt to report decreased UE symptoms to 5/10    Time  2    Period  Weeks    Status  New    Target Date  11/01/19        PT Long Term Goals - 10/19/19 1005      PT LONG TERM GOAL #1   Title  Pt to be independent with final  HEP    Time  6    Period  Weeks    Status  New    Target Date  11/29/19      PT LONG TERM GOAL #2   Title  Pt to report pain in R UE to 0-2/10 , intermittent    Time  6    Period  Weeks    Status  New    Target Date  11/29/19      PT LONG TERM GOAL #3    Title  Pt to demo ability for full AROM of R shoulder and neck without pain, to improve ability for ADLs and IADLs.    Time  6    Period  Weeks    Status  New    Target Date  11/29/19             Plan - 10/19/19 1007    Clinical Impression Statement  Pt presents with primary complaint of significant  increased pain in R UE, with numbness and tingling that is constant. Pt with mild ROM limitations in R shoulder and neck, due to increasing pain in UE. Pt with signficant decrease in ability for functional activity, due to pain, and much difficulty sleeping. Pt with mild muscle tension in cervical/scapular region, but does not seem to be primary source of pain. He also has no pain to provoke in c-spine today. Pt to benefit from skilled PT to improve deficits and pain.    Examination-Activity Limitations  Bathing;Locomotion Level;Reach Overhead;Self Feeding;Sit;Sleep;Carry;Dressing;Hygiene/Grooming;Lift    Examination-Participation Restrictions  Church;Meal Prep;Cleaning;Community Activity;Driving;Shop;Laundry;Yard Work    [employer]chant [employer] officer  Evolving/Moderate complexity    Clinical Decision Making  Moderate    Rehab Potential  Good    PT Frequency  2x / week    PT Duration  6 weeks    Consulted and Agree with Plan of Care  Patient       Patient Gomez benefit from skilled therapeutic intervention in order to improve the following deficits and impairments:  Decreased range of motion, Impaired UE functional use, Increased muscle spasms, Decreased endurance, Decreased activity tolerance, Pain, Decreased strength  Visit Diagnosis: Pain in right arm  Radiculopathy, cervical region     Problem List Patient Active Problem List   Diagnosis Date Noted  . Acute non-recurrent sinusitis 10/11/2018  . Genetic testing 02/26/2018  . Family history of breast cancer   . Family history of melanoma   . Family history of parotid cancer   . History of prostate cancer 01/26/2018  .  Plantar fasciitis of right foot 01/17/2018  . Chronic nonintractable headache 10/19/2017  . Deviated septum 10/19/2017  . Nasal turbinate hypertrophy 10/19/2017  . Eczema 01/11/2016  . Hypothyroidism 07/06/2015  . Snoring 02/23/2015  . 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 TINEA PEDIS 09/25/2009  . Hyperlipidemia 01/12/2009  . Allergic rhinitis 01/12/2009  . Asthma 01/12/2009  . GERD 01/12/2009  . Common migraine 01/12/2009  . NEPHROLITHIASIS, HX OF 01/12/2009   [name], PT, DPT 10:11 AM  10/19/19    [facility] [place], [place], [address] Phone: [phone]   Fax:  [phone]  Name: EDD ROWDEN MRN: [id number] Date of Birth: 1966/03/05

## 2019-10-20 ENCOUNTER — Encounter: Payer: Self-pay | Admitting: Physical Therapy

## 2019-10-20 ENCOUNTER — Ambulatory Visit (INDEPENDENT_AMBULATORY_CARE_PROVIDER_SITE_OTHER): Payer: No Typology Code available for payment source | Admitting: Physical Therapy

## 2019-10-20 DIAGNOSIS — M5412 Radiculopathy, cervical region: Secondary | ICD-10-CM

## 2019-10-20 DIAGNOSIS — M79601 Pain in right arm: Secondary | ICD-10-CM | POA: Diagnosis not present

## 2019-10-20 NOTE — Therapy (Signed)
[facility] [address] [place], [place], [address] Phone: [phone]   Fax:  [phone]  Physical Therapy Treatment  Patient Details  Name: BASSEM BOUMAN MRN: [id number] Date of Birth: Apr 07, 1966 Referring Provider (PT): Hilts   Encounter Date: 10/20/2019  PT End of Session - 10/20/19 1133    Visit Number  2    Number of Visits  12    Date for PT Re-Evaluation  11/29/19    Authorization Type  [facility]    PT Start Time  1103    PT Stop Time  1142    PT Time Calculation (min)  39 min    Activity Tolerance  Patient tolerated treatment well;Patient limited by pain    Behavior During Therapy  [facility] for tasks assessed/performed       Past Medical History:  Diagnosis Date  . Allergy    venom anaphylaxis  . Asthma    extrinsic  . 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  . Complication of anesthesia    slow to wake up   . Family history of breast cancer   . Family history of melanoma   . Family history of parotid cancer   . GERD (gastroesophageal reflux disease)   . Headache(784.0)    hx of migraines   . History of kidney stones   . Hyperlipidemia   . Hypothyroidism   . Nephrolithiasis     X 8,Dr Tannebaum  . PONV (postoperative nausea and vomiting)   . Prostate cancer ([place])   . Tuberculosis    hx of ox 1     Past Surgical History:  Procedure Laterality Date  . ELBOW SURGERY     Tommy Raimundo's  . KNEE ARTHROSCOPY  2011   R knee; Dr Maureen Ralphs  . LUMBAR FUSION  2004   Dr Trenton Gammon  . LYMPHADENECTOMY Bilateral 03/25/2018   Procedure: LYMPHADENECTOMY, PELVIC;  Surgeon: Raynelle Bring, MD;  Location: [facility];  Service: Urology;  Laterality: Bilateral;  . MICRODISCECTOMY LUMBAR  1999   L4-5,L5-S1;Dr. Trenton Gammon  . NASAL SEPTOPLASTY W/ TURBINOPLASTY    . ROBOT ASSISTED LAPAROSCOPIC RADICAL PROSTATECTOMY N/A 03/25/2018   Procedure: XI ROBOTIC ASSISTED LAPAROSCOPIC RADICAL PROSTATECTOMY LEVEL 2;  Surgeon: Raynelle Bring, MD;  Location[facility];   Service: Urology;  Laterality: N/A;  . TONSILLECTOMY    . VASECTOMY  2000    There were no vitals filed for this visit.  Subjective Assessment - 10/20/19 1132    Subjective  Pt states no change in pain. Very uncomfortable, unable to find comfortable position .  MRI scheduled for Dec 21st.    Currently in Pain?  Yes    Pain Score  8     Pain Location  Arm    Pain Orientation  Right    Pain Descriptors / Indicators  Tingling;Burning;Pins and needles                       [facility] Adult PT Treatment/Exercise - 10/20/19 1108      Exercises   Exercises  Neck      Neck Exercises: Seated   Neck Retraction  5 reps    Other Seated Exercise  Shoulder pulley flexion, x15     Other Seated Exercise  Retraction with extension x10;       Modalities   Modalities  Traction      Traction   Type of Traction  Cervical    Min (lbs)  12    Max (lbs)  16    Hold Time  5    Time  10 min      Manual Therapy   Manual Therapy  Joint mobilization;Soft tissue mobilization;Passive ROM;Manual Traction    Joint Mobilization  PA mobs, side glides, c-spine     Soft tissue mobilization  STM to R UT region     Manual Traction  --      Neck Exercises: Stretches   Upper Trapezius Stretch  2 reps;30 seconds    Upper Trapezius Stretch Limitations  on L .     Corner Stretch  --    Warehouse manager Limitations  --    Other Neck Stretches  --               PT Short Term Goals - 10/19/19 1005      PT SHORT TERM GOAL #1   Title  Pt to be independent with initial HEP    Time  2    Period  Weeks    Status  New    Target Date  11/01/19      PT SHORT TERM GOAL #2   Title  Pt to report decreased UE symptoms to 5/10    Time  2    Period  Weeks    Status  New    Target Date  11/01/19        PT Long Term Goals - 10/19/19 1005      PT LONG TERM GOAL #1   Title  Pt to be independent with final HEP    Time  6    Period  Weeks    Status  New    Target Date  11/29/19      PT  LONG TERM GOAL #2   Title  Pt to report pain in R UE to 0-2/10 , intermittent    Time  6    Period  Weeks    Status  New    Target Date  11/29/19      PT LONG TERM GOAL #3   Title  Pt to demo ability for full AROM of R shoulder and neck without pain, to improve ability for ADLs and IADLs.    Time  6    Period  Weeks    Status  New    Target Date  11/29/19            Plan - 10/20/19 1135    Clinical Impression Statement  Pt with signfiicant change, unable to find comfort with positioning or stretches. Cervical traction started today in attempts for pain relief. Pt reports mild decrease in arm tingling after traction today, will assess full effects next visit.    Examination-Activity Limitations  Bathing;Locomotion Level;Reach Overhead;Self Feeding;Sit;Sleep;Carry;Dressing;Hygiene/Grooming;Lift    Examination-Participation Restrictions  Church;Meal Prep;Cleaning;Community Activity;Driving;Shop;Laundry;Yard Work    [employer]chant [employer] officer  Evolving/Moderate complexity    Rehab Potential  Good    PT Frequency  2x / week    PT Duration  6 weeks    Consulted and Agree with Plan of Care  Patient       Patient will benefit from skilled therapeutic intervention in order to improve the following deficits and impairments:  Decreased range of motion, Impaired UE functional use, Increased muscle spasms, Decreased endurance, Decreased activity tolerance, Pain, Decreased strength  Visit Diagnosis: Radiculopathy, cervical region  Pain in right arm     Problem List Patient Active Problem List   Diagnosis Date  Noted  . Acute non-recurrent sinusitis 10/11/2018  . Genetic testing 02/26/2018  . Family history of breast cancer   . Family history of melanoma   . Family history of parotid cancer   . History of prostate cancer 01/26/2018  . Plantar fasciitis of right foot 01/17/2018  . Chronic nonintractable headache 10/19/2017  . Deviated septum 10/19/2017  . Nasal turbinate  hypertrophy 10/19/2017  . Eczema 01/11/2016  . Hypothyroidism 07/06/2015  . Snoring 02/23/2015  . 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 TINEA PEDIS 09/25/2009  . Hyperlipidemia 01/12/2009  . Allergic rhinitis 01/12/2009  . Asthma 01/12/2009  . GERD 01/12/2009  . Common migraine 01/12/2009  . NEPHROLITHIASIS, HX OF 01/12/2009    [name], PT, DPT 11:37 AM  10/20/19    [facility] [place], [place], [address] Phone: [phone]   Fax:  [phone]  Name: BENETT EUSTACE MRN: [id number] Date of Birth: 14-Apr-1966

## 2019-10-24 ENCOUNTER — Encounter: Payer: Self-pay | Admitting: Physical Therapy

## 2019-10-24 ENCOUNTER — Ambulatory Visit (INDEPENDENT_AMBULATORY_CARE_PROVIDER_SITE_OTHER): Payer: No Typology Code available for payment source | Admitting: Physical Therapy

## 2019-10-24 ENCOUNTER — Other Ambulatory Visit: Payer: Self-pay

## 2019-10-24 DIAGNOSIS — M5412 Radiculopathy, cervical region: Secondary | ICD-10-CM

## 2019-10-24 DIAGNOSIS — M79601 Pain in right arm: Secondary | ICD-10-CM

## 2019-10-24 NOTE — Therapy (Signed)
[facility] [address] [place], [place], [address] Phone: [phone]   Fax:  [phone]  Physical Therapy Treatment  Patient Details  Name: Aaron Gomez MRN: [id number] Date of Birth: January 28, 1966 Referring Provider (PT): Hilts   Encounter Date: 10/24/2019  PT End of Session - 10/24/19 1232    Visit Number  3    Number of Visits  12    Date for PT Re-Evaluation  11/29/19    Authorization Type  [facility]    PT Start Time  0935    PT Stop Time  1015    PT Time Calculation (min)  40 min    Activity Tolerance  Patient tolerated treatment well;Patient limited by pain    Behavior During Therapy  [facility] for tasks assessed/performed       Past Medical History:  Diagnosis Date  . Allergy    venom anaphylaxis  . Asthma    extrinsic  . 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  . Complication of anesthesia    slow to wake up   . Family history of breast cancer   . Family history of melanoma   . Family history of parotid cancer   . GERD (gastroesophageal reflux disease)   . Headache(784.0)    hx of migraines   . History of kidney stones   . Hyperlipidemia   . Hypothyroidism   . Nephrolithiasis     X 8,Dr Tannebaum  . PONV (postoperative nausea and vomiting)   . Prostate cancer ([place])   . Tuberculosis    hx of ox 1     Past Surgical History:  Procedure Laterality Date  . ELBOW SURGERY     Tommy Josede's  . KNEE ARTHROSCOPY  2011   R knee; Dr Maureen Ralphs  . LUMBAR FUSION  2004   Dr Trenton Gammon  . LYMPHADENECTOMY Bilateral 03/25/2018   Procedure: LYMPHADENECTOMY, PELVIC;  Surgeon: Raynelle Bring, MD;  [facility]: [facility];  Service: Urology;  Laterality: Bilateral;  . MICRODISCECTOMY LUMBAR  1999   L4-5,L5-S1;Dr. Trenton Gammon  . NASAL SEPTOPLASTY W/ TURBINOPLASTY    . ROBOT ASSISTED LAPAROSCOPIC RADICAL PROSTATECTOMY N/A 03/25/2018   Procedure: XI ROBOTIC ASSISTED LAPAROSCOPIC RADICAL PROSTATECTOMY LEVEL 2;  Surgeon: Raynelle Bring, MD;  [facility];   Service: Urology;  Laterality: N/A;  . TONSILLECTOMY    . VASECTOMY  2000    There were no vitals filed for this visit.  Subjective Assessment - 10/24/19 1231    Subjective  Pt states some relief from last session with traction, mild relief for a few hours that day, no pain when traction was on. He also found his home traction unit. He report continued, significant discomfort, unable to get much pain relief.    Currently in Pain?  Yes    Pain Score  7     Pain Location  Arm    Pain Orientation  Right    Pain Descriptors / Indicators  Burning;Pins and needles;Tingling    Pain Type  Acute pain    Pain Onset  More than a month ago    Pain Frequency  Intermittent                       [facility] Adult PT Treatment/Exercise - 10/24/19 0931      Self-Care   Self-Care  Other Self-Care Comments    Other Self-Care Comments   instruction on home use of traction      Exercises  Exercises  Neck      Neck Exercises: Seated   Neck Retraction  --    Other Seated Exercise  Shoulder pulley flexion, x15     Other Seated Exercise  Retraction with extension x10;       Modalities   Modalities  Traction      Traction   Type of Traction  Cervical    Min (lbs)  14    Max (lbs)  18    Hold Time  8    Time  16 min      Manual Therapy   Manual Therapy  Joint mobilization;Soft tissue mobilization;Passive ROM;Manual Traction    Joint Mobilization  --    Soft tissue mobilization  --    Passive ROM  R shoulder distraction, L shoulder PROM for flexion       Neck Exercises: Stretches   Upper Trapezius Stretch  2 reps;30 seconds    Upper Trapezius Stretch Limitations  on L .                PT Short Term Goals - 10/19/19 1005      PT SHORT TERM GOAL #1   Title  Pt to be independent with initial HEP    Time  2    Period  Weeks    Status  New    Target Date  11/01/19      PT SHORT TERM GOAL #2   Title  Pt to report decreased UE symptoms to 5/10    Time  2    Period   Weeks    Status  New    Target Date  11/01/19        PT Long Term Goals - 10/19/19 1005      PT LONG TERM GOAL #1   Title  Pt to be independent with final HEP    Time  6    Period  Weeks    Status  New    Target Date  11/29/19      PT LONG TERM GOAL #2   Title  Pt to report pain in R UE to 0-2/10 , intermittent    Time  6    Period  Weeks    Status  New    Target Date  11/29/19      PT LONG TERM GOAL #3   Title  Pt to demo ability for full AROM of R shoulder and neck without pain, to improve ability for ADLs and IADLs.    Time  6    Period  Weeks    Status  New    Target Date  11/29/19            Plan - 10/24/19 1235    Clinical Impression Statement  Traction providing mild relief for pt. Education done for use of home traction unit, recommended use 1-2 x/day. Will continue to use for pain relief. Most/all other activities increase pain.    Examination-Activity Limitations  Bathing;Locomotion Level;Reach Overhead;Self Feeding;Sit;Sleep;Carry;Dressing;Hygiene/Grooming;Lift    Examination-Participation Restrictions  Church;Meal Prep;Cleaning;Community Activity;Driving;Shop;Laundry;Yard Work    [employer]chant [employer] officer  Evolving/Moderate complexity    Rehab Potential  Good    PT Frequency  2x / week    PT Duration  6 weeks    Consulted and Agree with Plan of Care  Patient       Patient will benefit from skilled therapeutic intervention in order to improve the following deficits and impairments:  Decreased range of motion, Impaired  UE functional use, Increased muscle spasms, Decreased endurance, Decreased activity tolerance, Pain, Decreased strength  Visit Diagnosis: Radiculopathy, cervical region  Pain in right arm     Problem List Patient Active Problem List   Diagnosis Date Noted  . Acute non-recurrent sinusitis 10/11/2018  . Genetic testing 02/26/2018  . Family history of breast cancer   . Family history of melanoma   . Family history of  parotid cancer   . History of prostate cancer 01/26/2018  . Plantar fasciitis of right foot 01/17/2018  . Chronic nonintractable headache 10/19/2017  . Deviated septum 10/19/2017  . Nasal turbinate hypertrophy 10/19/2017  . Eczema 01/11/2016  . Hypothyroidism 07/06/2015  . Snoring 02/23/2015  . 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 TINEA PEDIS 09/25/2009  . Hyperlipidemia 01/12/2009  . Allergic rhinitis 01/12/2009  . Asthma 01/12/2009  . GERD 01/12/2009  . Common migraine 01/12/2009  . NEPHROLITHIASIS, HX OF 01/12/2009   [name], PT, DPT 12:36 PM  10/24/19    [facility] [place], [place], [address] Phone: [phone]   Fax:  [phone]  Name: Aaron Gomez MRN: [id number] Date of Birth: 1966/01/01

## 2019-10-27 ENCOUNTER — Encounter: Payer: Self-pay | Admitting: Family Medicine

## 2019-10-27 ENCOUNTER — Ambulatory Visit (INDEPENDENT_AMBULATORY_CARE_PROVIDER_SITE_OTHER): Payer: No Typology Code available for payment source | Admitting: Physical Therapy

## 2019-10-27 ENCOUNTER — Encounter: Payer: Self-pay | Admitting: Physical Therapy

## 2019-10-27 ENCOUNTER — Other Ambulatory Visit: Payer: Self-pay

## 2019-10-27 DIAGNOSIS — M5412 Radiculopathy, cervical region: Secondary | ICD-10-CM | POA: Diagnosis not present

## 2019-10-27 DIAGNOSIS — M79601 Pain in right arm: Secondary | ICD-10-CM | POA: Diagnosis not present

## 2019-10-27 NOTE — Therapy (Signed)
[facility] [address] [place], [place], [address] Phone: [phone]   Fax:  [phone]  Physical Therapy Treatment  Patient Details  Name: Aaron Gomez MRN: [id number] Date of Birth: [birth year] Referring Provider (PT): Hilts   Encounter Date: 10/27/2019  PT End of Session - 10/27/19 1200    Visit Number  4    Number of Visits  12    Date for PT Re-Evaluation  11/29/19    Authorization Type  [facility]    PT Start Time  0936    PT Stop Time  1017    PT Time Calculation (min)  41 min    Activity Tolerance  Patient tolerated treatment well;Patient limited by pain    Behavior During Therapy  [facility] for tasks assessed/performed       Past Medical History:  Diagnosis Date  . Allergy    venom anaphylaxis  . Asthma    extrinsic  . 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  . Complication of anesthesia    slow to wake up   . Family history of breast cancer   . Family history of melanoma   . Family history of parotid cancer   . GERD (gastroesophageal reflux disease)   . Headache(784.0)    hx of migraines   . History of kidney stones   . Hyperlipidemia   . Hypothyroidism   . Nephrolithiasis     X 8,Dr Tannebaum  . PONV (postoperative nausea and vomiting)   . Prostate cancer ([place])   . Tuberculosis    hx of ox 1     Past Surgical History:  Procedure Laterality Date  . ELBOW SURGERY     Tommy Zakariye's  . KNEE ARTHROSCOPY  2011   R knee; Dr Maureen Ralphs  . LUMBAR FUSION  2004   Dr Trenton Gammon  . LYMPHADENECTOMY Bilateral 03/25/2018   Procedure: LYMPHADENECTOMY, PELVIC;  Surgeon: Raynelle Bring, MD;  Location: [facility];  Service: Urology;  Laterality: Bilateral;  . MICRODISCECTOMY LUMBAR  1999   L4-5,L5-S1;Dr. Trenton Gammon  . NASAL SEPTOPLASTY W/ TURBINOPLASTY    . ROBOT ASSISTED LAPAROSCOPIC RADICAL PROSTATECTOMY N/A 03/25/2018   Procedure: XI ROBOTIC ASSISTED LAPAROSCOPIC RADICAL PROSTATECTOMY LEVEL 2;  Surgeon: Raynelle Bring, MD;  Location[facility];   Service: Urology;  Laterality: N/A;  . TONSILLECTOMY    . VASECTOMY  2000    There were no vitals filed for this visit.  Subjective Assessment - 10/27/19 1200    Subjective  Pt states only relief he is getting is when traction is on, and for about 15 min following that. He has been using his home unit as well for relief.    Patient Stated Goals  Decreased pain/radicular pain in arm    Currently in Pain?  Yes    Pain Location  Arm    Pain Orientation  Right    Pain Descriptors / Indicators  Aching;Burning;Pins and needles    Pain Type  Acute pain    Pain Onset  More than a month ago    Pain Frequency  Intermittent                       [facility] Adult PT Treatment/Exercise - 10/27/19 0001      Self-Care   Self-Care  Other Self-Care Comments    Other Self-Care Comments   instruction on home use of traction      Exercises   Exercises  Neck  Neck Exercises: Theraband   Rows  Red;20 reps    Other Theraband Exercises  Scap pull outs RTB x10;       Neck Exercises: Seated   Other Seated Exercise  --    Other Seated Exercise  --      Modalities   Modalities  Traction      Traction   Type of Traction  Cervical    Min (lbs)  14    Max (lbs)  19    Hold Time  8    Time  16 min      Manual Therapy   Manual Therapy  Joint mobilization;Soft tissue mobilization;Passive ROM;Manual Traction    Passive ROM  --      Neck Exercises: Stretches   Upper Trapezius Stretch  2 reps;30 seconds    Upper Trapezius Stretch Limitations  on L .     Corner Stretch  30 seconds;4 Estate manager/land agent Limitations  90 deg at doorway                PT Short Term Goals - 10/19/19 1005      PT SHORT TERM GOAL #1   Title  Pt to be independent with initial HEP    Time  2    Period  Weeks    Status  New    Target Date  11/01/19      PT SHORT TERM GOAL #2   Title  Pt to report decreased UE symptoms to 5/10    Time  2    Period  Weeks    Status  New    Target Date   11/01/19        PT Long Term Goals - 10/19/19 1005      PT LONG TERM GOAL #1   Title  Pt to be independent with final HEP    Time  6    Period  Weeks    Status  New    Target Date  11/29/19      PT LONG TERM GOAL #2   Title  Pt to report pain in R UE to 0-2/10 , intermittent    Time  6    Period  Weeks    Status  New    Target Date  11/29/19      PT LONG TERM GOAL #3   Title  Pt to demo ability for full AROM of R shoulder and neck without pain, to improve ability for ADLs and IADLs.    Time  6    Period  Weeks    Status  New    Target Date  11/29/19            Plan - 10/27/19 1203    Clinical Impression Statement  Pt only getting relief from traction. Pt educated on posture and attempting to do light strengthening if able. Most motions, ther ex increase symptoms. Traction is relieving nerve pain, but only temporarily. WIll continue Plan for another 1-2 weeks, then will refer back to MD. Pt schedule for MRI on 12/21. Pt in agreement with plan.    Examination-Activity Limitations  Bathing;Locomotion Level;Reach Overhead;Self Feeding;Sit;Sleep;Carry;Dressing;Hygiene/Grooming;Lift    Examination-Participation Restrictions  Church;Meal Prep;Cleaning;Community Activity;Driving;Shop;Laundry;Yard Work    Merchant navy officer  Evolving/Moderate complexity    Rehab Potential  Good    PT Frequency  2x / week    PT Duration  6 weeks    Consulted and Agree with Plan of Care  Patient  Patient will benefit from skilled therapeutic intervention in order to improve the following deficits and impairments:  Decreased range of motion, Impaired UE functional use, Increased muscle spasms, Decreased endurance, Decreased activity tolerance, Pain, Decreased strength  Visit Diagnosis: Radiculopathy, cervical region  Pain in right arm     Problem List Patient Active Problem List   Diagnosis Date Noted  . Acute non-recurrent sinusitis 10/11/2018  . Genetic testing  02/26/2018  . Family history of breast cancer   . Family history of melanoma   . Family history of parotid cancer   . History of prostate cancer 01/26/2018  . Plantar fasciitis of right foot 01/17/2018  . Chronic nonintractable headache 10/19/2017  . Deviated septum 10/19/2017  . Nasal turbinate hypertrophy 10/19/2017  . Eczema 01/11/2016  . Hypothyroidism 07/06/2015  . Snoring 02/23/2015  . 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 TINEA PEDIS 09/25/2009  . Hyperlipidemia 01/12/2009  . Allergic rhinitis 01/12/2009  . Asthma 01/12/2009  . GERD 01/12/2009  . Common migraine 01/12/2009  . NEPHROLITHIASIS, HX OF 01/12/2009   [name], PT, DPT 12:06 PM  10/27/19    [facility] [place], [place], [address] Phone: [phone]   Fax:  [phone]  Name: Aaron Gomez MRN: [id number] Date of Birth: Mar 03, 1966

## 2019-10-28 ENCOUNTER — Telehealth: Payer: Self-pay | Admitting: Family Medicine

## 2019-10-28 NOTE — Telephone Encounter (Signed)
LMOM for patient to call the office back to schedule their appointment with Dr. Junius Roads.

## 2019-10-31 ENCOUNTER — Ambulatory Visit (INDEPENDENT_AMBULATORY_CARE_PROVIDER_SITE_OTHER): Payer: No Typology Code available for payment source | Admitting: Physical Therapy

## 2019-10-31 ENCOUNTER — Encounter: Payer: Self-pay | Admitting: Physical Therapy

## 2019-10-31 DIAGNOSIS — M79601 Pain in right arm: Secondary | ICD-10-CM

## 2019-10-31 DIAGNOSIS — M5412 Radiculopathy, cervical region: Secondary | ICD-10-CM | POA: Diagnosis not present

## 2019-10-31 NOTE — Therapy (Signed)
[facility] [address] [name], [place], [address] Phone: [phone]   Fax:  [phone]  Physical Therapy Treatment  Patient Details  Name: Aaron Gomez MRN: [id number] Date of Birth: June 11, 1966 Referring Provider (PT): Hilts   Encounter Date: 10/31/2019  PT End of Session - 10/31/19 1000    Visit Number  5    Number of Visits  12    Date for PT Re-Evaluation  11/29/19    Authorization Type  [facility]    PT Start Time  0931    PT Stop Time  1010    PT Time Calculation (min)  39 min    Activity Tolerance  Patient tolerated treatment well;Patient limited by pain    Behavior During Therapy  [facility] for tasks assessed/performed       Past Medical History:  Diagnosis Date  . Allergy    venom anaphylaxis  . Asthma    extrinsic  . 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  . Complication of anesthesia    slow to wake up   . Family history of breast cancer   . Family history of melanoma   . Family history of parotid cancer   . GERD (gastroesophageal reflux disease)   . Headache(784.0)    hx of migraines   . History of kidney stones   . Hyperlipidemia   . Hypothyroidism   . Nephrolithiasis     X 8,Dr Tannebaum  . PONV (postoperative nausea and vomiting)   . Prostate cancer ([place])   . Tuberculosis    hx of ox 1     Past Surgical History:  Procedure Laterality Date  . ELBOW SURGERY     Tommy Marque's  . KNEE ARTHROSCOPY  2011   R knee; Dr Maureen Ralphs  . LUMBAR FUSION  2004   Dr Trenton Gammon  . LYMPHADENECTOMY Bilateral 03/25/2018   Procedure: LYMPHADENECTOMY, PELVIC;  Surgeon: Raynelle Bring, MD;  [facility];  Service: Urology;  Laterality: Bilateral;  . MICRODISCECTOMY LUMBAR  1999   L4-5,L5-S1;Dr. Trenton Gammon  . NASAL SEPTOPLASTY W/ TURBINOPLASTY    . ROBOT ASSISTED LAPAROSCOPIC RADICAL PROSTATECTOMY N/A 03/25/2018   Procedure: XI ROBOTIC ASSISTED LAPAROSCOPIC RADICAL PROSTATECTOMY LEVEL 2;  Surgeon: Raynelle Bring, MD;  [facility];   Service: Urology;  Laterality: N/A;  . TONSILLECTOMY    . VASECTOMY  2000    There were no vitals filed for this visit.  Subjective Assessment - 10/31/19 0959    Subjective  Pt states Saturday was "not a bad day", and had less pain and tingling than usual. Today, arm is very sore, tingling    Currently in Pain?  Yes    Pain Score  8     Pain Location  Arm    Pain Orientation  Right    Pain Descriptors / Indicators  Aching;Burning;Pins and needles    Pain Type  Acute pain    Pain Onset  More than a month ago    Pain Frequency  Intermittent                       [facility] Adult PT Treatment/Exercise - 10/31/19 0958      Self-Care   Self-Care  --    Other Self-Care Comments   --      Exercises   Exercises  Neck      Neck Exercises: Theraband   Rows  --    Other Theraband Exercises  --  Modalities   Modalities  Traction      Traction   Type of Traction  Cervical    Min (lbs)  15    Max (lbs)  20    Hold Time  8    Time  16 min      Manual Therapy   Manual Therapy  Joint mobilization;Soft tissue mobilization;Passive ROM;Manual Traction    Manual therapy comments  skilled palpation and monitoring of soft tissue with dry needling.     Soft tissue mobilization  DTM/IASTM to R UT and levator       Neck Exercises: Stretches   Upper Trapezius Stretch  --    Upper Trapezius Stretch Limitations  --    Engineer, water Limitations  --               PT Short Term Goals - 10/31/19 1001      PT SHORT TERM GOAL #1   Title  Pt to be independent with initial HEP    Time  2    Period  Weeks    Status  Achieved    Target Date  11/01/19      PT SHORT TERM GOAL #2   Title  Pt to report decreased UE symptoms to 5/10    Time  2    Period  Weeks    Status  Partially Met    Target Date  11/01/19        PT Long Term Goals - 10/19/19 1005      PT LONG TERM GOAL #1   Title  Pt to be independent with final HEP    Time  6     Period  Weeks    Status  New    Target Date  11/29/19      PT LONG TERM GOAL #2   Title  Pt to report pain in R UE to 0-2/10 , intermittent    Time  6    Period  Weeks    Status  New    Target Date  11/29/19      PT LONG TERM GOAL #3   Title  Pt to demo ability for full AROM of R shoulder and neck without pain, to improve ability for ADLs and IADLs.    Time  6    Period  Weeks    Status  New    Target Date  11/29/19            Plan - 10/31/19 1001    Clinical Impression Statement  Pt with quite a bit of soreness during and after dry needling today, radicular pain in R UE. Pt returing Wednesday, will assess effects of DN for future visits.    Examination-Activity Limitations  Bathing;Locomotion Level;Reach Overhead;Self Feeding;Sit;Sleep;Carry;Dressing;Hygiene/Grooming;Lift    Examination-Participation Restrictions  Church;Meal Prep;Cleaning;Community Activity;Driving;Shop;Laundry;Yard Work    Merchant navy officer  Evolving/Moderate complexity    Rehab Potential  Good    PT Frequency  2x / week    PT Duration  6 weeks    Consulted and Agree with Plan of Care  Patient       Patient will benefit from skilled therapeutic intervention in order to improve the following deficits and impairments:  Decreased range of motion, Impaired UE functional use, Increased muscle spasms, Decreased endurance, Decreased activity tolerance, Pain, Decreased strength  Visit Diagnosis: Radiculopathy, cervical region  Pain in right arm     Problem List Patient Active Problem  List   Diagnosis Date Noted  . Acute non-recurrent sinusitis 10/11/2018  . Genetic testing 02/26/2018  . Family history of breast cancer   . Family history of melanoma   . Family history of parotid cancer   . History of prostate cancer 01/26/2018  . Plantar fasciitis of right foot 01/17/2018  . Chronic nonintractable headache 10/19/2017  . Deviated septum 10/19/2017  . Nasal turbinate hypertrophy  10/19/2017  . Eczema 01/11/2016  . Hypothyroidism 07/06/2015  . Snoring 02/23/2015  . 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 TINEA PEDIS 09/25/2009  . Hyperlipidemia 01/12/2009  . Allergic rhinitis 01/12/2009  . Asthma 01/12/2009  . GERD 01/12/2009  . Common migraine 01/12/2009  . NEPHROLITHIASIS, HX OF 01/12/2009    Aaron Gomez, PT, DPT 11:53 AM  10/31/19    [facility] [place], [place], [address] Phone: [phone]   Fax:  [phone]  Name: NABIL BUBOLZ MRN: [id number] Date of Birth: Feb 08, 1966

## 2019-11-02 ENCOUNTER — Encounter: Payer: Self-pay | Admitting: Physical Therapy

## 2019-11-02 ENCOUNTER — Ambulatory Visit (INDEPENDENT_AMBULATORY_CARE_PROVIDER_SITE_OTHER): Payer: No Typology Code available for payment source | Admitting: Physical Therapy

## 2019-11-02 ENCOUNTER — Other Ambulatory Visit: Payer: Self-pay

## 2019-11-02 DIAGNOSIS — M79601 Pain in right arm: Secondary | ICD-10-CM

## 2019-11-02 DIAGNOSIS — M5412 Radiculopathy, cervical region: Secondary | ICD-10-CM | POA: Diagnosis not present

## 2019-11-02 NOTE — Therapy (Addendum)
[facility] [address] [place], [place], [address] Phone: [phone]   Fax:  [phone]  Physical Therapy Treatment  Patient Details  Name: Aaron Gomez MRN: [id number] Date of Birth: 19-Feb-1966 Referring Provider (PT): Hilts   Encounter Date: 11/02/2019  PT End of Session - 11/02/19 0912     Visit Number  6    Number of Visits  12    Date for PT Re-Evaluation  11/29/19    Authorization Type  [facility]    PT Start Time  0846    PT Stop Time  0926    PT Time Calculation (min)  40 min    Activity Tolerance  Patient tolerated treatment well;Patient limited by pain    Behavior During Therapy  [facility] for tasks assessed/performed        Past Medical History:  Diagnosis Date   Allergy    venom anaphylaxis   Asthma    extrinsic   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   Complication of anesthesia    slow to wake up    Family history of breast cancer    Family history of melanoma    Family history of parotid cancer    GERD (gastroesophageal reflux disease)    Headache(784.0)    hx of migraines    History of kidney stones    Hyperlipidemia    Hypothyroidism    Nephrolithiasis     X 8,Dr Tannebaum   PONV (postoperative nausea and vomiting)    Prostate cancer ([place])    Tuberculosis    hx of ox 1     Past Surgical History:  Procedure Laterality Date   ELBOW SURGERY     Tommy Seger's   KNEE ARTHROSCOPY  2011   R knee; Dr Maureen Ralphs   LUMBAR FUSION  2004   Dr Trenton Gammon   LYMPHADENECTOMY Bilateral 03/25/2018   Procedure: LYMPHADENECTOMY, PELVIC;  Surgeon: Raynelle Bring, MD;  [facility];  Service: Urology;  Laterality: Bilateral;   MICRODISCECTOMY LUMBAR  1999   L4-5,L5-S1;Dr. Poole   NASAL SEPTOPLASTY W/ TURBINOPLASTY     ROBOT ASSISTED LAPAROSCOPIC RADICAL PROSTATECTOMY N/A 03/25/2018   Procedure: XI ROBOTIC ASSISTED LAPAROSCOPIC RADICAL PROSTATECTOMY LEVEL 2;  Surgeon: Raynelle Bring, MD;  [facility];  Service: Urology;   Laterality: N/A;   TONSILLECTOMY     VASECTOMY  2000    There were no vitals filed for this visit.  Subjective Assessment - 11/02/19 0911     Subjective  Pt continues to have severe pain in R UE today. States he has been sore since last visit, and has been unable to get much relief with home traction , positioning, or meds (making him nausiated)    Currently in Pain?  Yes    Pain Score  8     Pain Location  Arm    Pain Orientation  Right    Pain Descriptors / Indicators  Aching;Burning    Pain Type  Acute pain    Pain Onset  More than a month ago    Pain Frequency  Intermittent                        [facility] Adult PT Treatment/Exercise - 11/02/19 0001       Traction   Type of Traction  Cervical    Min (lbs)  17    Max (lbs)  20    Hold Time  8  Time  16 min      Manual Therapy   Soft tissue mobilization  DTM/IASTM to R UT and levator     Manual Traction  R UE distraction              PT Education - 11/02/19 0912     Education Details  Educated on continued use of home traction, recomended f/u with MD after MRI    Person(s) Educated  Patient    Methods  Explanation    Comprehension  Verbalized understanding        PT Short Term Goals - 11/02/19 0913       PT SHORT TERM GOAL #1   Title  Pt to be independent with initial HEP    Time  2    Period  Weeks    Status  Achieved    Target Date  11/01/19      PT SHORT TERM GOAL #2   Title  Pt to report decreased UE symptoms to 5/10    Time  2    Period  Weeks    Status  Partially Met    Target Date  11/01/19         PT Long Term Goals - 11/02/19 0913       PT LONG TERM GOAL #1   Title  Pt to be independent with final HEP    Time  6    Period  Weeks    Status  Achieved      PT LONG TERM GOAL #2   Title  Pt to report pain in R UE to 0-2/10 , intermittent    Time  6    Period  Weeks    Status  Not Met      PT LONG TERM GOAL #3   Title  Pt to demo ability for full AROM of R  shoulder and neck without pain, to improve ability for ADLs and IADLs.    Time  6    Period  Weeks    Status  Not Met             Plan - 11/02/19 0914     Clinical Impression Statement  Pt continues to have signfiicant pain in UE. Pt is getting pain/radicular pain relief with cervical traction unit during our sessions. Carryover has been variable, and  Pt still in a great deal of discomfort in R UE. He has cervical ROM wfl. Pt scheduled for MRI on monday, and will follow up wiht MD on Wednesday. Will continue to see pt until MD follow up for symptom reduction.    Examination-Activity Limitations  Bathing;Locomotion Level;Reach Overhead;Self Feeding;Sit;Sleep;Carry;Dressing;Hygiene/Grooming;Lift    Examination-Participation Restrictions  Church;Meal Prep;Cleaning;Community Activity;Driving;Shop;Laundry;Yard Work    Merchant navy officer  Evolving/Moderate complexity    Rehab Potential  Good    PT Frequency  2x / week    PT Duration  6 weeks    Consulted and Agree with Plan of Care  Patient        Patient will benefit from skilled therapeutic intervention in order to improve the following deficits and impairments:  Decreased range of motion, Impaired UE functional use, Increased muscle spasms, Decreased endurance, Decreased activity tolerance, Pain, Decreased strength  Visit Diagnosis: Radiculopathy, cervical region  Pain in right arm     Problem List Patient Active Problem List   Diagnosis Date Noted   Acute non-recurrent sinusitis 10/11/2018   Genetic testing 02/26/2018   Family history of breast cancer  Family history of melanoma    Family history of parotid cancer    History of prostate cancer 01/26/2018   Plantar fasciitis of right foot 01/17/2018   Chronic nonintractable headache 10/19/2017   Deviated septum 10/19/2017   Nasal turbinate hypertrophy 10/19/2017   Eczema 01/11/2016   Hypothyroidism 07/06/2015   Snoring 02/23/2015   Anaphylaxis  05/23/2011   TINEA PEDIS 09/25/2009   Hyperlipidemia 01/12/2009   Allergic rhinitis 01/12/2009   Asthma 01/12/2009   GERD 01/12/2009   Common migraine 01/12/2009   NEPHROLITHIASIS, HX OF 01/12/2009   [name], PT, DPT 9:20 AM  11/02/19    [facility] [address] [place], [place], [address] Phone: [phone]   Fax:  [phone]  Name: Aaron Gomez MRN: [id number] Date of Birth: 1966-01-04  PHYSICAL THERAPY DISCHARGE SUMMARY  Visits from Start of Care: 6 Plan: Patient agrees to discharge.  Patient goals were not met. Patient is being discharged due to - not returning since last visit, still having significant pain, will f/u with MD.    [name], PT, DPT 11:21 AM  01/16/22

## 2019-11-07 ENCOUNTER — Other Ambulatory Visit: Payer: Self-pay

## 2019-11-07 ENCOUNTER — Telehealth: Payer: Self-pay | Admitting: Family Medicine

## 2019-11-07 ENCOUNTER — Ambulatory Visit
Admission: RE | Admit: 2019-11-07 | Discharge: 2019-11-07 | Disposition: A | Discharge status: Home / Self Care | Payer: No Typology Code available for payment source | Source: Ambulatory Visit | Attending: Family Medicine | Admitting: Family Medicine

## 2019-11-07 ENCOUNTER — Encounter: Payer: Self-pay | Admitting: Family Medicine

## 2019-11-07 DIAGNOSIS — M5412 Radiculopathy, cervical region: Secondary | ICD-10-CM

## 2019-11-07 DIAGNOSIS — M542 Cervicalgia: Secondary | ICD-10-CM

## 2019-11-07 NOTE — Telephone Encounter (Signed)
I spoke with patient and he's getting worse with PT.  Didn't have success with ESI for his lumbar issues, and is reluctant to try for his neck.  Would like referral to Dr. Annette Stable to discuss surgical options.  Will schedule 3 month MRI to evaluate C5 vertebra lesion.

## 2019-11-07 NOTE — Telephone Encounter (Signed)
Cervical spine MRI scan is notable for a right-sided disc protrusion resulting in spinal stenosis, mild final cord contact, and severe right-sided C7 foraminal narrowing.  There are some disc bulges at other levels as well as arthritic change, but the most likely cause of current right arm pain would be the C6-7 disc.  Radiologist notes an incidental finding of a 6 mm bone lesion in the C5 vertebral body which is most likely benign and seems to be unchanged since 2005.  However, due to history of prostate cancer, another MRI in 2 to 3 months would be worth considering or else a whole body bone scan.  Regarding neck and right arm pain, treatment options would include referral for epidural steroid injection, or consultation with spine surgeon.

## 2019-11-07 NOTE — Addendum Note (Signed)
Addended by: Hortencia Pilar on: 11/07/2019 01:35 PM   Modules accepted: Orders

## 2019-11-08 ENCOUNTER — Encounter: Payer: No Typology Code available for payment source | Admitting: Physical Therapy

## 2019-11-09 ENCOUNTER — Ambulatory Visit: Payer: No Typology Code available for payment source | Admitting: Family Medicine

## 2019-11-18 HISTORY — PX: CERVICAL SPINE SURGERY: SHX589

## 2019-11-21 DIAGNOSIS — J3089 Other allergic rhinitis: Secondary | ICD-10-CM | POA: Diagnosis not present

## 2019-11-21 DIAGNOSIS — J301 Allergic rhinitis due to pollen: Secondary | ICD-10-CM | POA: Diagnosis not present

## 2019-11-21 MED FILL — tiZANidine HCL 2 MG TABS: 2 | 30 days supply | Qty: 60 | Fill #1

## 2019-11-21 MED FILL — ATORVASTATIN 20 MG TABLET: 20 | 90 days supply | Qty: 90 | Fill #1

## 2019-11-21 MED FILL — LEVOTHYROXINE 100 MCG TABLE: 100 | 90 days supply | Qty: 90 | Fill #1

## 2019-11-21 MED FILL — NABUMETONE 750 MG TABS: 750 | 30 days supply | Qty: 60 | Fill #1

## 2019-11-21 MED FILL — BACLOFEN 10 MG TABS: 10 | 10 days supply | Qty: 60 | Fill #1

## 2019-11-23 DIAGNOSIS — T63461D Toxic effect of venom of wasps, accidental (unintentional), subsequent encounter: Secondary | ICD-10-CM | POA: Diagnosis not present

## 2019-11-23 DIAGNOSIS — T63451D Toxic effect of venom of hornets, accidental (unintentional), subsequent encounter: Secondary | ICD-10-CM | POA: Diagnosis not present

## 2019-11-24 DIAGNOSIS — R030 Elevated blood-pressure reading, without diagnosis of hypertension: Secondary | ICD-10-CM | POA: Diagnosis not present

## 2019-11-24 DIAGNOSIS — M5412 Radiculopathy, cervical region: Secondary | ICD-10-CM | POA: Diagnosis not present

## 2019-11-24 DIAGNOSIS — Z6827 Body mass index (BMI) 27.0-27.9, adult: Secondary | ICD-10-CM | POA: Diagnosis not present

## 2019-11-28 DIAGNOSIS — T63451D Toxic effect of venom of hornets, accidental (unintentional), subsequent encounter: Secondary | ICD-10-CM | POA: Diagnosis not present

## 2019-11-28 DIAGNOSIS — Z01818 Encounter for other preprocedural examination: Secondary | ICD-10-CM | POA: Diagnosis not present

## 2019-11-28 DIAGNOSIS — T63461D Toxic effect of venom of wasps, accidental (unintentional), subsequent encounter: Secondary | ICD-10-CM | POA: Diagnosis not present

## 2019-12-02 DIAGNOSIS — M50122 Cervical disc disorder at C5-C6 level with radiculopathy: Secondary | ICD-10-CM | POA: Diagnosis not present

## 2019-12-02 DIAGNOSIS — M50222 Other cervical disc displacement at C5-C6 level: Secondary | ICD-10-CM | POA: Diagnosis not present

## 2019-12-02 DIAGNOSIS — M5412 Radiculopathy, cervical region: Secondary | ICD-10-CM | POA: Diagnosis not present

## 2019-12-02 MED FILL — HYDROCODON-APAP 5-325: 5-325 | 15 days supply | Qty: 60 | Fill #0

## 2019-12-02 MED FILL — CYCLOBENZAPRINE 10 MG TAB: 10 | 10 days supply | Qty: 30 | Fill #0

## 2019-12-09 DIAGNOSIS — J301 Allergic rhinitis due to pollen: Secondary | ICD-10-CM | POA: Diagnosis not present

## 2019-12-09 DIAGNOSIS — J3089 Other allergic rhinitis: Secondary | ICD-10-CM | POA: Diagnosis not present

## 2019-12-23 DIAGNOSIS — T63451D Toxic effect of venom of hornets, accidental (unintentional), subsequent encounter: Secondary | ICD-10-CM | POA: Diagnosis not present

## 2019-12-23 DIAGNOSIS — T63461D Toxic effect of venom of wasps, accidental (unintentional), subsequent encounter: Secondary | ICD-10-CM | POA: Diagnosis not present

## 2019-12-29 DIAGNOSIS — M5412 Radiculopathy, cervical region: Secondary | ICD-10-CM | POA: Diagnosis not present

## 2020-01-03 DIAGNOSIS — J301 Allergic rhinitis due to pollen: Secondary | ICD-10-CM | POA: Diagnosis not present

## 2020-01-03 DIAGNOSIS — J3089 Other allergic rhinitis: Secondary | ICD-10-CM | POA: Diagnosis not present

## 2020-01-06 ENCOUNTER — Other Ambulatory Visit: Payer: Self-pay

## 2020-01-06 ENCOUNTER — Ambulatory Visit (INDEPENDENT_AMBULATORY_CARE_PROVIDER_SITE_OTHER): Payer: 59 | Admitting: Internal Medicine

## 2020-01-06 ENCOUNTER — Encounter: Payer: Self-pay | Admitting: Internal Medicine

## 2020-01-06 VITALS — BP 110/70 | HR 73 | Temp 98.1°F | Ht 72.0 in | Wt 203.0 lb

## 2020-01-06 DIAGNOSIS — R1310 Dysphagia, unspecified: Secondary | ICD-10-CM | POA: Diagnosis not present

## 2020-01-06 DIAGNOSIS — Z01818 Encounter for other preprocedural examination: Secondary | ICD-10-CM | POA: Diagnosis not present

## 2020-01-06 NOTE — Patient Instructions (Addendum)
You have been scheduled for an endoscopy. Please follow written instructions given to you at your visit today. If you use inhalers (even only as needed), please bring them with you on the day of your procedure.   You have been scheduled for a Barium Esophogram at [facility] on 01/10/2020 at 9:30AM. Please arrive 20 minutes prior to your appointment for registration. Make certain not to have anything to eat or drink 3 hours prior to your test. If you need to reschedule for any reason, please contact radiology at [phone] to do so. __________________________________________________________________ A barium swallow is an examination that concentrates on views of the esophagus. This tends to be a double contrast exam (barium and two liquids which, when combined, create a gas to distend the wall of the oesophagus) or single contrast (non-ionic iodine based). The study is usually tailored to your symptoms so a good history is essential. Attention is paid during the study to the form, structure and configuration of the esophagus, looking for functional disorders (such as aspiration, dysphagia, achalasia, motility and reflux) EXAMINATION You may be asked to change into a gown, depending on the type of swallow being performed. A radiologist and radiographer will perform the procedure. The radiologist will advise you of the type of contrast selected for your procedure and direct you during the exam. You will be asked to stand, sit or lie in several different positions and to hold a small amount of fluid in your mouth before being asked to swallow while the imaging is performed .In some instances you may be asked to swallow barium coated marshmallows to assess the motility of a solid food bolus. The exam can be recorded as a digital or video fluoroscopy procedure. POST PROCEDURE It will take 1-2 days for the barium to pass through your system. To facilitate this, it is important, unless otherwise  directed, to increase your fluids for the next 24-48hrs and to resume your normal diet.  This test typically takes about 30 minutes to perform. __________________________________________________________________________________   I appreciate the opportunity to care for you. Silvano Rusk, MD, [facility]

## 2020-01-06 NOTE — Progress Notes (Signed)
Aaron Gomez 54 y.o. 01/15/1966 [id number]  Assessment & Plan:   Encounter Diagnosis  Name Primary?  Aaron Gomez Dysphagia, unspecified type Yes   Problems predate c-spine surgery C spine disease makes me think this could be pharyngoesophageal in origin, but has some esophageal sxs as well. Pills, liquids and food.  Evaluate with Ba swallow and tablet + EGd and possible dilation   [id number], Aaron Mars, MD   Subjective:   Chief Complaint: dysphagia  HPI 53 yo wm w/ hx of DJD C-spine and radiculopathy successfully treated with surgery by Dr. Annette Stable last month. Prior to that and since that time has had difficulty swallowing some pills, solid food and some liquids.  Allergies  Allergen Reactions  . Banana Anaphylaxis  . Bee Venom Anaphylaxis    Also venom from wasps & hornets cause anaphylaxis; he carries Epi-Pen & has Medi Alert bracelet  . Neomycin Sulfate Anaphylaxis    Anaphylactic shock  Because of a history of documented adverse serious drug reaction;Medi Alert bracelet  is recommended  . Strawberry Extract Anaphylaxis  . Tape Anaphylaxis    adhesvie tape-rash, blisters  . Venomil Mixed Vespid  [Mixed Vespid Venom] Anaphylaxis  . Percocet [Oxycodone-Acetaminophen] Nausea Only    Post surgery   Current Meds  Medication Sig  . Albuterol Sulfate (PROAIR RESPICLICK) 123XX123 (90 Base) MCG/ACT AEPB Inhale 2 puffs into the lungs every 6 (six) hours as needed (shortness of breath).  . ASPIRIN 81 PO   . atorvastatin (LIPITOR) 20 MG tablet Take 1 tablet (20 mg total) by mouth daily.  . baclofen (LIORESAL) 10 MG tablet Take 1-2 tablets (10-20 mg total) by mouth 3 (three) times daily as needed for muscle spasms.  . cyclobenzaprine (FLEXERIL) 10 MG tablet Take by mouth.  . EPINEPHrine (AUVI-Q) 0.3 mg/0.3 mL IJ SOAJ injection Inject 0.3 mg into the muscle once.  . famotidine (PEPCID) 20 MG tablet Take 20 mg by mouth daily.  . fexofenadine (ALLEGRA) 180 MG tablet Take 180 mg by mouth daily.    Aaron Gomez levothyroxine (SYNTHROID) 100 MCG tablet Take 1 tablet (100 mcg total) by mouth daily before breakfast.  . nabumetone (RELAFEN) 750 MG tablet Take 1 tablet (750 mg total) by mouth 2 (two) times daily as needed.  Aaron Gomez oxyCODONE-acetaminophen (PERCOCET) 10-325 MG tablet Take 1 tablet by mouth every 6 (six) hours as needed for pain (migraine.).  Aaron Gomez rizatriptan (MAXALT-MLT) 10 MG disintegrating tablet TAKE 1 TABLET BY MOUTH EVERY DAY AS NEEDED FOR MIGRAINE. DISSOLVE IN MOUTH  . sildenafil (REVATIO) 20 MG tablet Take 20-100 mg by mouth at bedtime.  Aaron Gomez tiZANidine (ZANAFLEX) 2 MG tablet Take 1-2 tablets (2-4 mg total) by mouth at bedtime as needed for muscle spasms.  . traMADol (ULTRAM) 50 MG tablet Take 1 tablet (50 mg total) by mouth every 6 (six) hours as needed.   Past Medical History:  Diagnosis Date  . Allergy    venom anaphylaxis  . Asthma    extrinsic  . 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  . Complication of anesthesia    slow to wake up   . Family history of breast cancer   . Family history of melanoma   . Family history of parotid cancer   . GERD (gastroesophageal reflux disease)   . Headache(784.0)    hx of migraines   . History of kidney stones   . Hyperlipidemia   . Hypothyroidism   . Nephrolithiasis     X 8,Dr Tannebaum  .  PONV (postoperative nausea and vomiting)   . Prostate cancer ([place])   . Tuberculosis    hx of ox 1    Past Surgical History:  Procedure Laterality Date  . ELBOW SURGERY     [facility]  . KNEE ARTHROSCOPY  2011   R knee; Dr Maureen Ralphs  . LUMBAR FUSION  2004   Dr Trenton Gammon  . LYMPHADENECTOMY Bilateral 03/25/2018   Procedure: LYMPHADENECTOMY, PELVIC;  Surgeon: Raynelle Bring, MD;  Location: [facility];  Service: Urology;  Laterality: Bilateral;  . MICRODISCECTOMY LUMBAR  1999   L4-5,L5-S1;Dr. Trenton Gammon  . NASAL SEPTOPLASTY W/ TURBINOPLASTY    . ROBOT ASSISTED LAPAROSCOPIC RADICAL PROSTATECTOMY N/A 03/25/2018   Procedure: XI ROBOTIC ASSISTED LAPAROSCOPIC RADICAL  PROSTATECTOMY LEVEL 2;  Surgeon: Raynelle Bring, MD;  Location: [facility];  Service: Urology;  Laterality: N/A;  . TONSILLECTOMY    . VASECTOMY  2000   Social History   Social History Narrative   Married. 2 kids-son and daughter. Both [place] in 2020- son graduating 2020- Run[employer], broadcasting/film/video.       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 33 scouts   family history includes Breast cancer (age of onset: 97) in his sister; Cancer in his maternal grandfather; Crohn's disease in his brother; Diabetes in his paternal grandmother; Heart attack (age of onset: 34) in his brother; Heart attack (age of onset: 31) in his father; Heart attack (age of onset: 65) in his paternal aunt and paternal grandfather; Heart disease in his paternal grandmother; Hypertension in his paternal grandmother; Melanoma in his maternal aunt; Thyroid disease in his mother.   Review of Systems As above BP 110/70   Pulse 73   Temp 98.1 F (36.7 C)   Ht 6' (1.829 m)   Wt 203 lb (92.1 kg)   BMI 27.53 kg/m  NAD  Objective:   Physical Exam

## 2020-01-10 ENCOUNTER — Ambulatory Visit
Admission: RE | Admit: 2020-01-10 | Discharge: 2020-01-10 | Disposition: A | Discharge status: Home / Self Care | Payer: 59 | Source: Ambulatory Visit | Attending: Internal Medicine | Admitting: Internal Medicine

## 2020-01-10 DIAGNOSIS — R1310 Dysphagia, unspecified: Secondary | ICD-10-CM

## 2020-01-18 DIAGNOSIS — T63451D Toxic effect of venom of hornets, accidental (unintentional), subsequent encounter: Secondary | ICD-10-CM | POA: Diagnosis not present

## 2020-01-18 DIAGNOSIS — T63461D Toxic effect of venom of wasps, accidental (unintentional), subsequent encounter: Secondary | ICD-10-CM | POA: Diagnosis not present

## 2020-01-20 DIAGNOSIS — J3089 Other allergic rhinitis: Secondary | ICD-10-CM | POA: Diagnosis not present

## 2020-01-20 DIAGNOSIS — J301 Allergic rhinitis due to pollen: Secondary | ICD-10-CM | POA: Diagnosis not present

## 2020-01-30 ENCOUNTER — Encounter: Payer: Self-pay | Admitting: Internal Medicine

## 2020-01-31 DIAGNOSIS — H524 Presbyopia: Secondary | ICD-10-CM | POA: Diagnosis not present

## 2020-02-01 DIAGNOSIS — C61 Malignant neoplasm of prostate: Secondary | ICD-10-CM | POA: Diagnosis not present

## 2020-02-02 ENCOUNTER — Other Ambulatory Visit: Payer: Self-pay | Admitting: Gastroenterology

## 2020-02-02 ENCOUNTER — Ambulatory Visit (INDEPENDENT_AMBULATORY_CARE_PROVIDER_SITE_OTHER): Payer: 59

## 2020-02-02 DIAGNOSIS — Z1159 Encounter for screening for other viral diseases: Secondary | ICD-10-CM

## 2020-02-02 LAB — SARS CORONAVIRUS 2 (TAT 6-24 HRS): SARS Coronavirus 2: NEGATIVE

## 2020-02-06 ENCOUNTER — Other Ambulatory Visit: Payer: Self-pay

## 2020-02-06 ENCOUNTER — Encounter: Payer: Self-pay | Admitting: Internal Medicine

## 2020-02-06 ENCOUNTER — Ambulatory Visit (AMBULATORY_SURGERY_CENTER): Payer: 59 | Admitting: Internal Medicine

## 2020-02-06 VITALS — BP 125/85 | HR 53 | Temp 96.8°F | Resp 16 | Ht 72.0 in | Wt 203.0 lb

## 2020-02-06 DIAGNOSIS — R1310 Dysphagia, unspecified: Secondary | ICD-10-CM | POA: Diagnosis not present

## 2020-02-06 MED ORDER — SODIUM CHLORIDE 0.9 % IV SOLN: 500.0000 mL | Freq: Once | INTRAVENOUS | Status: DC

## 2020-02-06 NOTE — Op Note (Signed)
[place] Patient Name: Aaron Gomez Procedure Date: 02/06/2020 10:26 AM MRN: [id number] Endoscopist: Gatha Mayer , MD Age: 54 Referring MD:  Date of Birth: [birth year] Gender: Male Account #: [id number] Procedure:                Upper GI endoscopy Indications:              Dysphagia Medicines:                Propofol per Anesthesia, Monitored Anesthesia Care Procedure:                Pre-Anesthesia Assessment:                           - Prior to the procedure, a History and Physical                            was performed, and patient medications and                            allergies were reviewed. The patient's tolerance of                            previous anesthesia was also reviewed. The risks                            and benefits of the procedure and the sedation                            options and risks were discussed with the patient.                            All questions were answered, and informed consent                            was obtained. Prior Anticoagulants: The patient has                            taken no previous anticoagulant or antiplatelet                            agents. ASA Grade Assessment: II - A patient with                            mild systemic disease. After reviewing the risks                            and benefits, the patient was deemed in                            satisfactory condition to undergo the procedure.                           After obtaining informed consent, the endoscope was  passed under direct vision. Throughout the                            procedure, the patient's blood pressure, pulse, and                            oxygen saturations were monitored continuously. The                            Endoscope was introduced through the mouth, and                            advanced to the second part of duodenum. The upper                            GI endoscopy was  accomplished without difficulty.                            The patient tolerated the procedure well. Scope In: Scope Out: Findings:                 No endoscopic abnormality was evident in the                            esophagus to explain the patient's complaint of                            dysphagia.                           The entire examined stomach was normal.                           The examined duodenum was normal.                           The cardia and gastric fundus were normal on                            retroflexion. Complications:            No immediate complications. Estimated Blood Loss:     Estimated blood loss: none. Impression:               - No endoscopic esophageal abnormality to explain                            patient's dysphagia.                           - Normal stomach.                           - Normal examined duodenum.                           - No specimens collected. Recommendation:           -  Patient has a contact number available for                            emergencies. The signs and symptoms of potential                            delayed complications were discussed with the                            patient. Return to normal activities tomorrow.                            Written discharge instructions were provided to the                            patient.                           - Resume previous diet.                           - Continue present medications.                           - Observe vs do modified barium swallow - will                            discuss Gatha Mayer, MD 02/06/2020 10:48:13 AM This report has been signed electronically.

## 2020-02-06 NOTE — Progress Notes (Signed)
Temp by JB  Vitals by

## 2020-02-06 NOTE — Progress Notes (Signed)
A/ox3, pleased with MAC, report to RN 

## 2020-02-06 NOTE — Patient Instructions (Addendum)
The exam looks normal.  I think the problem is in the throat area where it meets the esophagus, perhaps.  Next step in evaluation would be a speech pathology evaluation. Will schedule a modified barium swallow test as discussed.  I appreciate the opportunity to care for you. Gatha Mayer, MD, [facility]  Resume previous diet Continue current medication Office will set you up for modified barium swallow  YOU HAD AN ENDOSCOPIC PROCEDURE TODAY AT [place]:   Refer to the procedure report that was given to you for any specific questions about what was found during the examination.  If the procedure report does not answer your questions, please call your gastroenterologist to clarify.  If you requested that your care partner not be given the details of your procedure findings, then the procedure report has been included in a sealed envelope for you to review at your convenience later.  YOU SHOULD EXPECT: Some feelings of bloating in the abdomen. Passage of more gas than usual.  Walking can help get rid of the air that was put into your GI tract during the procedure and reduce the bloating. If you had a lower endoscopy (such as a colonoscopy or flexible sigmoidoscopy) you may notice spotting of blood in your stool or on the toilet paper. If you underwent a bowel prep for your procedure, you may not have a normal bowel movement for a few days.  Please Note:  You might notice some irritation and congestion in your nose or some drainage.  This is from the oxygen used during your procedure.  There is no need for concern and it should clear up in a day or so.  SYMPTOMS TO REPORT IMMEDIATELY:    Following upper endoscopy (EGD)  Vomiting of blood or coffee ground material  New chest pain or pain under the shoulder blades  Painful or persistently difficult swallowing  New shortness of breath  Fever of 100F or higher  Black, tarry-looking stools  For urgent or emergent issues, a  gastroenterologist can be reached at any hour by calling [phone]. Do not use MyChart messaging for urgent concerns.    DIET:  We do recommend a small meal at first, but then you may proceed to your regular diet.  Drink plenty of fluids but you should avoid alcoholic beverages for 24 hours.  ACTIVITY:  You should plan to take it easy for the rest of today and you should NOT DRIVE or use heavy machinery until tomorrow (because of the sedation medicines used during the test).    FOLLOW UP: Our staff will call the number listed on your records 48-72 hours following your procedure to check on you and address any questions or concerns that you may have regarding the information given to you following your procedure. If we do not reach you, we will leave a message.  We will attempt to reach you two times.  During this call, we will ask if you have developed any symptoms of COVID 19. If you develop any symptoms (ie: fever, flu-like symptoms, shortness of breath, cough etc.) before then, please call [phone].  If you test positive for Covid 19 in the 2 weeks post procedure, please call and report this information to [place].    If any biopsies were taken you will be contacted by phone or by letter within the next 1-3 weeks.  Please call us at [phone] if you have not heard about the biopsies in 3 weeks.  SIGNATURES/CONFIDENTIALITY: You and/or your care partner have signed paperwork which will be entered into your electronic medical record.  These signatures attest to the fact that that the information above on your After Visit Summary has been reviewed and is understood.  Full responsibility of the confidentiality of this discharge information lies with you and/or your care-partner.

## 2020-02-07 ENCOUNTER — Other Ambulatory Visit: Payer: Self-pay | Admitting: *Deleted

## 2020-02-07 ENCOUNTER — Telehealth: Payer: Self-pay | Admitting: *Deleted

## 2020-02-07 DIAGNOSIS — R1310 Dysphagia, unspecified: Secondary | ICD-10-CM

## 2020-02-07 NOTE — Telephone Encounter (Signed)
Orders electronically placed in Epic for a modified barium swallow.   Will follow up tomorrow to ensure order was received and patient will be scheduled.

## 2020-02-07 NOTE — Telephone Encounter (Signed)
-----   Message from Gatha Mayer, MD sent at 02/06/2020 10:56 AM EDT ----- Regarding: schedule MBS Please arrange for a modified barium swallow re pharyngoesophageal dysphagia  Negative EGD and barium swallow

## 2020-02-08 ENCOUNTER — Telehealth: Payer: Self-pay

## 2020-02-08 ENCOUNTER — Other Ambulatory Visit (HOSPITAL_COMMUNITY): Payer: Self-pay

## 2020-02-08 DIAGNOSIS — C61 Malignant neoplasm of prostate: Secondary | ICD-10-CM | POA: Diagnosis not present

## 2020-02-08 DIAGNOSIS — R1310 Dysphagia, unspecified: Secondary | ICD-10-CM

## 2020-02-08 DIAGNOSIS — N5231 Erectile dysfunction following radical prostatectomy: Secondary | ICD-10-CM | POA: Diagnosis not present

## 2020-02-08 NOTE — Telephone Encounter (Signed)
Spoke to Jarrett Soho [phone]), alerted her to the order for the modified barium swallow requested by Dr. Carlean Purl. She reported she would call the patient to schedule.

## 2020-02-08 NOTE — Telephone Encounter (Signed)
  Follow up Call-  Call back number 02/06/2020  Post procedure Call Back phone  # [phone]  Permission to leave phone message Yes  Some recent data might be hidden     Patient questions:  Do you have a fever, pain , or abdominal swelling? No. Pain Score  0 *  Have you tolerated food without any problems? Yes.    Have you been able to return to your normal activities? Yes.    Do you have any questions about your discharge instructions: Diet   No. Medications  No. Follow up visit  No.  Do you have questions or concerns about your Care? No.  Actions: * If pain score is 4 or above: No action needed, pain <4.  1. Have you developed a fever since your procedure? no  2.   Have you had an respiratory symptoms (SOB or cough) since your procedure? no  3.   Have you tested positive for COVID 19 since your procedure no  4.   Have you had any family members/close contacts diagnosed with the COVID 19 since your procedure?  no   If yes to any of these questions please route to Joylene Ember, RN and Erenest Rasher, RN

## 2020-02-13 DIAGNOSIS — J3081 Allergic rhinitis due to animal (cat) (dog) hair and dander: Secondary | ICD-10-CM | POA: Diagnosis not present

## 2020-02-13 DIAGNOSIS — J3089 Other allergic rhinitis: Secondary | ICD-10-CM | POA: Diagnosis not present

## 2020-02-13 DIAGNOSIS — J301 Allergic rhinitis due to pollen: Secondary | ICD-10-CM | POA: Diagnosis not present

## 2020-02-16 ENCOUNTER — Ambulatory Visit (HOSPITAL_COMMUNITY)
Admission: RE | Admit: 2020-02-16 | Discharge: 2020-02-16 | Disposition: A | Discharge status: Home / Self Care | Payer: 59 | Source: Ambulatory Visit | Attending: Internal Medicine | Admitting: Internal Medicine

## 2020-02-16 ENCOUNTER — Other Ambulatory Visit: Payer: Self-pay

## 2020-02-16 DIAGNOSIS — R1310 Dysphagia, unspecified: Secondary | ICD-10-CM | POA: Insufficient documentation

## 2020-02-22 DIAGNOSIS — T63461D Toxic effect of venom of wasps, accidental (unintentional), subsequent encounter: Secondary | ICD-10-CM | POA: Diagnosis not present

## 2020-02-22 DIAGNOSIS — T63451D Toxic effect of venom of hornets, accidental (unintentional), subsequent encounter: Secondary | ICD-10-CM | POA: Diagnosis not present

## 2020-02-28 ENCOUNTER — Encounter: Payer: Self-pay | Admitting: Family Medicine

## 2020-03-12 MED FILL — ATORVASTATIN 20 MG TABLET: 20 | 90 days supply | Qty: 90 | Fill #2

## 2020-03-12 MED FILL — LEVOTHYROXINE 100 MCG TABLE: 100 | 90 days supply | Qty: 90 | Fill #2

## 2020-03-14 DIAGNOSIS — J301 Allergic rhinitis due to pollen: Secondary | ICD-10-CM | POA: Diagnosis not present

## 2020-03-14 DIAGNOSIS — J3089 Other allergic rhinitis: Secondary | ICD-10-CM | POA: Diagnosis not present

## 2020-03-20 DIAGNOSIS — T63451D Toxic effect of venom of hornets, accidental (unintentional), subsequent encounter: Secondary | ICD-10-CM | POA: Diagnosis not present

## 2020-03-20 DIAGNOSIS — T63461D Toxic effect of venom of wasps, accidental (unintentional), subsequent encounter: Secondary | ICD-10-CM | POA: Diagnosis not present

## 2020-04-03 DIAGNOSIS — J3089 Other allergic rhinitis: Secondary | ICD-10-CM | POA: Diagnosis not present

## 2020-04-03 DIAGNOSIS — J301 Allergic rhinitis due to pollen: Secondary | ICD-10-CM | POA: Diagnosis not present

## 2020-04-17 DIAGNOSIS — T63451D Toxic effect of venom of hornets, accidental (unintentional), subsequent encounter: Secondary | ICD-10-CM | POA: Diagnosis not present

## 2020-04-17 DIAGNOSIS — Z91018 Allergy to other foods: Secondary | ICD-10-CM | POA: Diagnosis not present

## 2020-04-17 DIAGNOSIS — J3089 Other allergic rhinitis: Secondary | ICD-10-CM | POA: Diagnosis not present

## 2020-04-17 DIAGNOSIS — J45990 Exercise induced bronchospasm: Secondary | ICD-10-CM | POA: Diagnosis not present

## 2020-04-17 DIAGNOSIS — J301 Allergic rhinitis due to pollen: Secondary | ICD-10-CM | POA: Diagnosis not present

## 2020-04-17 MED FILL — SYMBICORT 80-4.5 MCG INH: 80-4.5 | 30 days supply | Qty: 10 | Fill #0

## 2020-04-17 MED FILL — PROAIR RESPICLICK INHAL PWD: 108 (90 BAS | 30 days supply | Qty: 1 | Fill #0

## 2020-04-24 DIAGNOSIS — J3089 Other allergic rhinitis: Secondary | ICD-10-CM | POA: Diagnosis not present

## 2020-04-24 DIAGNOSIS — J301 Allergic rhinitis due to pollen: Secondary | ICD-10-CM | POA: Diagnosis not present

## 2020-05-03 ENCOUNTER — Encounter: Payer: Self-pay | Admitting: Family Medicine

## 2020-05-03 MED ORDER — SCOPOLAMINE 1 MG/3DAYS TD PT72: 1.0000 | MEDICATED_PATCH | TRANSDERMAL | 0 refills | Status: DC

## 2020-05-09 MED FILL — SCOPOLAMINE 1 MG/3DAYS PT72: 1 | 30 days supply | Qty: 10 | Fill #0

## 2020-05-16 DIAGNOSIS — J3089 Other allergic rhinitis: Secondary | ICD-10-CM | POA: Diagnosis not present

## 2020-05-16 DIAGNOSIS — J301 Allergic rhinitis due to pollen: Secondary | ICD-10-CM | POA: Diagnosis not present

## 2020-05-22 DIAGNOSIS — T63451D Toxic effect of venom of hornets, accidental (unintentional), subsequent encounter: Secondary | ICD-10-CM | POA: Diagnosis not present

## 2020-05-22 DIAGNOSIS — T63461D Toxic effect of venom of wasps, accidental (unintentional), subsequent encounter: Secondary | ICD-10-CM | POA: Diagnosis not present

## 2020-06-11 DIAGNOSIS — J301 Allergic rhinitis due to pollen: Secondary | ICD-10-CM | POA: Diagnosis not present

## 2020-06-11 DIAGNOSIS — J3089 Other allergic rhinitis: Secondary | ICD-10-CM | POA: Diagnosis not present

## 2020-06-12 MED FILL — LEVOTHYROXINE 100 MCG TABLE: 100 | 90 days supply | Qty: 90 | Fill #3

## 2020-06-12 MED FILL — ATORVASTATIN 20 MG TABLET: 20 | 90 days supply | Qty: 90 | Fill #3

## 2020-06-18 DIAGNOSIS — T63461D Toxic effect of venom of wasps, accidental (unintentional), subsequent encounter: Secondary | ICD-10-CM | POA: Diagnosis not present

## 2020-06-18 DIAGNOSIS — T63451D Toxic effect of venom of hornets, accidental (unintentional), subsequent encounter: Secondary | ICD-10-CM | POA: Diagnosis not present

## 2020-06-26 DIAGNOSIS — J3089 Other allergic rhinitis: Secondary | ICD-10-CM | POA: Diagnosis not present

## 2020-06-26 DIAGNOSIS — J3081 Allergic rhinitis due to animal (cat) (dog) hair and dander: Secondary | ICD-10-CM | POA: Diagnosis not present

## 2020-06-26 DIAGNOSIS — J301 Allergic rhinitis due to pollen: Secondary | ICD-10-CM | POA: Diagnosis not present

## 2020-07-16 DIAGNOSIS — J3089 Other allergic rhinitis: Secondary | ICD-10-CM | POA: Diagnosis not present

## 2020-07-16 DIAGNOSIS — J301 Allergic rhinitis due to pollen: Secondary | ICD-10-CM | POA: Diagnosis not present

## 2020-07-18 DIAGNOSIS — Z20822 Contact with and (suspected) exposure to covid-19: Secondary | ICD-10-CM | POA: Diagnosis not present

## 2020-08-02 NOTE — Progress Notes (Signed)
Phone: [phone]   Subjective:  Patient presents today for their annual physical. Chief complaint-noted.   See problem oriented charting- ROS- full  review of systems was completed and negative  except for: tinnitus, hearing loss, joint pain, seasonal allergies, food allergies, migraines, headaches  The following were reviewed and entered/updated in epic: Past Medical History:  Diagnosis Date  . Allergy    venom anaphylaxis  . Asthma    extrinsic  . 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  . Complication of anesthesia    slow to wake up   . Family history of breast cancer   . Family history of melanoma   . Family history of parotid cancer   . GERD (gastroesophageal reflux disease)   . Headache(784.0)    hx of migraines   . History of kidney stones   . Hyperlipidemia   . Hypothyroidism   . Nephrolithiasis     X 8,Dr Tannebaum  . PONV (postoperative nausea and vomiting)   . Prostate cancer ([employer])   . Tuberculosis    hx of ox 1    Patient Active Problem List   Diagnosis Date Noted  . History of prostate cancer 01/26/2018    Priority: High  . Hypothyroidism 07/06/2015    Priority: Medium  . 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 Hyperlipidemia 01/12/2009    Priority: Medium  . Asthma 01/12/2009    Priority: Medium  . Common migraine 01/12/2009    Priority: Medium  . Genetic testing 02/26/2018    Priority: Low  . Family history of breast cancer     Priority: Low  . Family history of melanoma     Priority: Low  . Family history of parotid cancer     Priority: Low  . Plantar fasciitis of right foot 01/17/2018    Priority: Low  . Eczema 01/11/2016    Priority: Low  . Snoring 02/23/2015    Priority: Low  . TINEA PEDIS 09/25/2009    Priority: Low  . Allergic rhinitis 01/12/2009    Priority: Low  . GERD 01/12/2009    Priority: Low  . NEPHROLITHIASIS, HX OF 01/12/2009    Priority: Low  . Acute non-recurrent sinusitis 10/11/2018  . Chronic nonintractable  headache 10/19/2017  . Deviated septum 10/19/2017  . Nasal turbinate hypertrophy 10/19/2017   Past Surgical History:  Procedure Laterality Date  . CERVICAL SPINE SURGERY  11/2019   Dr. Annette Stable, disc replacements  . COLONOSCOPY  2017  . ELBOW SURGERY     Tommy Mansur's  . KNEE ARTHROSCOPY  2011   R knee; Dr Maureen Ralphs  . LUMBAR FUSION  2004   Dr Trenton Gammon  . LYMPHADENECTOMY Bilateral 03/25/2018   Procedure: LYMPHADENECTOMY, PELVIC;  Surgeon: Raynelle Bring, MD;  Location: [facility];  Service: Urology;  Laterality: Bilateral;  . MICRODISCECTOMY LUMBAR  1999   L4-5,L5-S1;Dr. Trenton Gammon  . NASAL SEPTOPLASTY W/ TURBINOPLASTY    . ROBOT ASSISTED LAPAROSCOPIC RADICAL PROSTATECTOMY N/A 03/25/2018   Procedure: XI ROBOTIC ASSISTED LAPAROSCOPIC RADICAL PROSTATECTOMY LEVEL 2;  Surgeon: Raynelle Bring, MD;  Location: [facility];  Service: Urology;  Laterality: N/A;  . TONSILLECTOMY    . VASECTOMY  2000    Family History  Problem Relation Age of Onset  . Heart attack Father 88       died @ 63  . Thyroid disease Mother   . Heart attack Brother 77       1/2 brother  . Crohn's disease Brother   .  Hypertension Paternal Grandmother   . Heart disease Paternal Grandmother        MI in 57s  . Diabetes Paternal Grandmother   . Heart attack Paternal Grandfather 62  . Heart attack Paternal Aunt 54  . Breast cancer Sister 7  . Melanoma Maternal Aunt   . Cancer Maternal Grandfather        parotid mixed tumor- met lung  . Colon cancer Neg Hx   . Esophageal cancer Neg Hx   . Pancreatic cancer Neg Hx   . Prostate cancer Neg Hx   . Rectal cancer Neg Hx   . Stomach cancer Neg Hx     Medications- reviewed and updated Current Outpatient Medications  Medication Sig Dispense Refill  . Albuterol Sulfate (PROAIR RESPICLICK) 827 (90 Base) MCG/ACT AEPB Inhale 2 puffs into the lungs every 6 (six) hours as needed (shortness of breath).    . ASPIRIN 81 PO Take 81 mg by mouth daily.     [name] atorvastatin (LIPITOR) 20 MG tablet Take 1  tablet (20 mg total) by mouth daily. 90 tablet 3  . baclofen (LIORESAL) 10 MG tablet Take 1-2 tablets (10-20 mg total) by mouth 3 (three) times daily as needed for muscle spasms. 60 each 3  . cyclobenzaprine (FLEXERIL) 10 MG tablet Take by mouth.     . EPINEPHrine (AUVI-Q) 0.3 mg/0.3 mL IJ SOAJ injection Inject 0.3 mg into the muscle once.    . famotidine (PEPCID) 20 MG tablet Take 20 mg by mouth daily.    . fexofenadine (ALLEGRA) 180 MG tablet Take 180 mg by mouth daily.     [name] levothyroxine (SYNTHROID) 100 MCG tablet Take 1 tablet (100 mcg total) by mouth daily before breakfast. 90 tablet 3  . nabumetone (RELAFEN) 750 MG tablet Take 1 tablet (750 mg total) by mouth 2 (two) times daily as needed. 60 tablet 6  . oxyCODONE-acetaminophen (PERCOCET) 10-325 MG tablet Take 1 tablet by mouth every 6 (six) hours as needed for pain (migraine.). 20 tablet 0  . rizatriptan (MAXALT-MLT) 10 MG disintegrating tablet TAKE 1 TABLET BY MOUTH EVERY DAY AS NEEDED FOR MIGRAINE. DISSOLVE IN MOUTH 12 tablet 5  . scopolamine (TRANSDERM-SCOP) 1 MG/3DAYS Place 1 patch (1.5 mg total) onto the skin every 3 (three) days. 10 patch 0  . sildenafil (REVATIO) 20 MG tablet Take 20-100 mg by mouth at bedtime.    [name] tiZANidine (ZANAFLEX) 2 MG tablet Take 1-2 tablets (2-4 mg total) by mouth at bedtime as needed for muscle spasms. 60 tablet 1  . traMADol (ULTRAM) 50 MG tablet Take 1 tablet (50 mg total) by mouth every 6 (six) hours as needed. 30 tablet 0  . fluticasone (FLONASE) 50 MCG/ACT nasal spray Place 1 spray into the nose at bedtime.      No current facility-administered medications for this visit.    Allergies-reviewed and updated Allergies  Allergen Reactions  . Banana Anaphylaxis  . Bee Venom Anaphylaxis    Also venom from wasps & hornets cause anaphylaxis; he carries Epi-Pen & has Medi Alert bracelet  . Neomycin Sulfate Anaphylaxis    Anaphylactic shock  Because of a history of documented adverse serious drug  reaction;Medi Alert bracelet  is recommended  . Strawberry Extract Anaphylaxis  . Tape Anaphylaxis    adhesvie tape-rash, blisters  . Venomil Mixed Vespid  [Mixed Vespid Venom] Anaphylaxis  . Percocet [Oxycodone-Acetaminophen] Nausea Only    Post surgery    Social History   Social History Narrative  Married. 2 kids-son and daughter. Both [place] in 2020- son graduating 2020- [employer], broadcasting/film/video.       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 19 scouts   Objective  Objective:  BP 118/82   Pulse 63   Temp 98.1 F (36.7 C) (Temporal)   Resp 18   Ht 6' (1.829 m)   Wt 203 lb (92.1 kg)   SpO2 96%   BMI 27.53 kg/m  Gen: NAD, resting comfortably HEENT: Mucous membranes are moist. Oropharynx normal Neck: no thyromegaly CV: RRR no murmurs rubs or gallops Lungs: CTAB no crackles, wheeze, rhonchi Abdomen: soft/nontender/nondistended/normal bowel sounds. No rebound or guarding.  Ext: no edema Skin: warm, dry Neuro: grossly normal, moves all extremities, PERRLA    Assessment and Plan  54 y.o. male presenting for annual physical.  Health Maintenance counseling: 1. Anticipatory guidance: Patient counseled regarding regular dental exams q6 months, eye exams -yearly,  avoiding smoking and second hand smoke, limiting alcohol to 2 beverages per day.   2. Risk factor reduction:  Advised patient of need for regular exercise and diet rich and fruits and vegetables to reduce risk of heart attack and stroke. Exercise- 2-3 x a week running- planning on training for half marathon.  Diet- trying to eat healthy diet- feels like portion control is an issue for him- feels hungry if eats less though so has been a challenge- can get migraines Wt Readings from Last 3 Encounters:  08/03/20 203 lb (92.1 kg)  02/06/20 203 lb (92.1 kg)  01/06/20 203 lb (92.1 kg)  3. Immunizations/screenings/ancillary studies-flu shot  today.  Also discussed  hepatitis C screening- opts in  Immunization History  Administered Date(s) Administered  . Influenza,inj,Quad PF,6+ Mos 08/17/2013, 08/02/2019  . Influenza-Unspecified 11/02/2015, 08/22/2016, 08/31/2017, 08/30/2018  . PFIZER SARS-COV-2 Vaccination 02/07/2020, 02/28/2020  . Td 11/17/2001  . Tdap 02/02/2012  . Zoster Recombinat (Shingrix) 07/29/2018, 10/12/2018  4. Prostate cancer screening- history of prostatectomy with Dr. Alinda Money.  Gleason was 5+4 = 9.  PSAs have significantly trended down-continue to be monitored by urology- states undetectable Lab Results  Component Value Date   PSA 0.015 06/25/2018   PSA 5.82 01/22/2018   PSA 4.38 (H) 07/07/2017   5. Colon cancer screening - colonoscopy 09/10/2016 with 10-year follow-up planned 6. Skin cancer screening-no dermatologist. advised regular sunscreen use. Denies worrisome, changing, or new skin lesions.  7.  Former smoker-1.5 pack years quit in the 1980s.  No regular screening planned other than AAA scan at age 51 8. STD screening - opts out of monogamous  Status of chronic or acute concerns   #GFR monitoring-slight decrease in GFR last year.  Discussed avoiding NSAIDs.  Update CMP today   #hyperlipidemia S: Medication: atorvastatin 20Mg daily Lab Results  Component Value Date   CHOL 156 08/02/2019   HDL 44.50 08/02/2019   LDLCALC 99 01/25/2018   LDLDIRECT 88.0 08/02/2019   TRIG 234.0 (H) 08/02/2019   CHOLHDL 4 08/02/2019   A/P: We discussed new LDL goal of 70 or less-patient takes aspirin due to family history of premature MI in father.  Will work on lifestyle and keep same dose as long as LDL under 100- do not want to increase prediabetes risk further  #hypothyroidism S: compliant On thyroid medication- Synthroid 100Mg. Gets smell of smoke sensation if tsh off- happened once when missed med recently Lab Results  Component Value Date  TSH 2.64 08/02/2019  A /P:hopefully controlled- update tsh   #  GERD= reasonable control on  pepcid 20Mg  #Buzzing sound in ears S: Patient mentioned that over the last year has had some bilateral ear buzzing that comes and goes. Notes hearing worse over time. No vertigo A/P: Discussed possible referral to ENT-he has an ENT provider 1.4 so he will reach out if he becomes ready to take this step  #Migraines-takes rizatriptan 10 mg as needed typically 2-3 times per week if sinus is really bothering him.  More recently did 1.5 months without any but then had 2 back to back before recent [place] trip, then had one 10 days into [place] trip but certainly better than last year- probably once a month at least.    #Allergic rhinitis-on Allegra and Flonase.  Follows with an allergist due to history of anaphylaxis-doing venom immunotherapy now on maintenance with this and on allergy shots.   # Hyperglycemia/insulin resistance/prediabetes S:  Medication: None Exercise and diet- see above Lab Results  Component Value Date   HGBA1C 5.8 08/02/2019   HGBA1C 5.8 07/29/2018   A/P: update a1c- continue lifestyle modification efforts   Recommended follow up: Return in about 1 year (around 08/03/2021) for physical or sooner if needed.  Lab/Order associations: fasting   ICD-10-CM   1. Preventative health care  K91.79 COMPLETE METABOLIC PANEL WITH GFR    Lipid panel    CBC    TSH    Hepatitis C antibody    Hemoglobin A1c  2. Gastroesophageal reflux disease without esophagitis  K21.9   3. Hypothyroidism, unspecified type  E03.9 TSH  4. Hyperlipidemia, unspecified hyperlipidemia type  X50.5 COMPLETE METABOLIC PANEL WITH GFR    Lipid panel    CBC  5. Hyperglycemia  R73.9 Hemoglobin A1c  6. Encounter for hepatitis C screening test for low risk patient  Z11.59 Hepatitis C antibody    No orders of the defined types were placed in this encounter.   Return precautions advised.  Garret Reddish, MD

## 2020-08-02 NOTE — Patient Instructions (Addendum)
Please stop by lab before you go If you have mychart- we will send your results within 3 business days of [place] receiving them.  If you do not have mychart- we will call you about results within 5 business days of [place] receiving them.  *please note we are currently using [facility] which has a longer processing time than [facility] typically so labs may not come back as quickly as in the past *please also note that you will see labs on mychart as soon as they post. I will later go in and write notes on them- will say "notes from Dr. Yong Channel"  Health Maintenance Due  Topic Date Due  . INFLUENZA VACCINE In office flu shot 06/17/2020   We will call you within two weeks about your referral to dermatology. If you do not hear within 3 weeks, give [place] a call.

## 2020-08-03 ENCOUNTER — Encounter: Payer: Self-pay | Admitting: Family Medicine

## 2020-08-03 ENCOUNTER — Other Ambulatory Visit: Payer: Self-pay

## 2020-08-03 ENCOUNTER — Ambulatory Visit (INDEPENDENT_AMBULATORY_CARE_PROVIDER_SITE_OTHER): Payer: 59 | Admitting: Family Medicine

## 2020-08-03 VITALS — BP 118/82 | HR 63 | Temp 98.1°F | Resp 18 | Ht 72.0 in | Wt 203.0 lb

## 2020-08-03 DIAGNOSIS — K219 Gastro-esophageal reflux disease without esophagitis: Secondary | ICD-10-CM | POA: Diagnosis not present

## 2020-08-03 DIAGNOSIS — Z0000 Encounter for general adult medical examination without abnormal findings: Secondary | ICD-10-CM | POA: Diagnosis not present

## 2020-08-03 DIAGNOSIS — R739 Hyperglycemia, unspecified: Secondary | ICD-10-CM

## 2020-08-03 DIAGNOSIS — E039 Hypothyroidism, unspecified: Secondary | ICD-10-CM

## 2020-08-03 DIAGNOSIS — E785 Hyperlipidemia, unspecified: Secondary | ICD-10-CM

## 2020-08-03 DIAGNOSIS — Z1283 Encounter for screening for malignant neoplasm of skin: Secondary | ICD-10-CM | POA: Diagnosis not present

## 2020-08-03 DIAGNOSIS — Z1159 Encounter for screening for other viral diseases: Secondary | ICD-10-CM | POA: Diagnosis not present

## 2020-08-03 DIAGNOSIS — Z23 Encounter for immunization: Secondary | ICD-10-CM | POA: Diagnosis not present

## 2020-08-03 NOTE — Addendum Note (Signed)
Addended by: Thomes Cake on: 08/03/2020 10:56 AM   Modules accepted: Orders

## 2020-08-06 LAB — CBC
HCT: 47.9 % (ref 38.5–50.0)
Hemoglobin: 16.2 g/dL (ref 13.2–17.1)
MCH: 30.3 pg (ref 27.0–33.0)
MCHC: 33.8 g/dL (ref 32.0–36.0)
MCV: 89.7 fL (ref 80.0–100.0)
MPV: 10.4 fL (ref 7.5–12.5)
Platelets: 316 10*3/uL (ref 140–400)
RBC: 5.34 10*6/uL (ref 4.20–5.80)
RDW: 12.3 % (ref 11.0–15.0)
WBC: 8 10*3/uL (ref 3.8–10.8)

## 2020-08-06 LAB — HEMOGLOBIN A1C
Hgb A1c MFr Bld: 5.7 % of total Hgb — ABNORMAL HIGH (ref <5.7)
Mean Plasma Glucose: 117 (calc)
eAG (mmol/L): 6.5 (calc)

## 2020-08-06 LAB — COMPLETE METABOLIC PANEL WITH GFR
AG Ratio: 2 (calc) (ref 1.0–2.5)
ALT: 52 U/L — ABNORMAL HIGH (ref 9–46)
AST: 28 U/L (ref 10–35)
Albumin: 4.6 g/dL (ref 3.6–5.1)
Alkaline phosphatase (APISO): 76 U/L (ref 35–144)
BUN: 13 mg/dL (ref 7–25)
CO2: 29 mmol/L (ref 20–32)
Calcium: 9.8 mg/dL (ref 8.6–10.3)
Chloride: 102 mmol/L (ref 98–110)
Creat: 1.08 mg/dL (ref 0.70–1.33)
GFR, Est African American: 90 mL/min/{1.73_m2} (ref >=60)
GFR, Est Non African American: 77 mL/min/{1.73_m2} (ref >=60)
Globulin: 2.3 g/dL (calc) (ref 1.9–3.7)
Glucose, Bld: 89 mg/dL (ref 65–99)
Potassium: 4.7 mmol/L (ref 3.5–5.3)
Sodium: 137 mmol/L (ref 135–146)
Total Bilirubin: 1 mg/dL (ref 0.2–1.2)
Total Protein: 6.9 g/dL (ref 6.1–8.1)

## 2020-08-06 LAB — LIPID PANEL
Cholesterol: 182 mg/dL (ref <200)
HDL: 49 mg/dL (ref >=40)
LDL Cholesterol (Calc): 101 mg/dL (calc) — ABNORMAL HIGH
Non-HDL Cholesterol (Calc): 133 mg/dL (calc) — ABNORMAL HIGH (ref <130)
Total CHOL/HDL Ratio: 3.7 (calc) (ref <5.0)
Triglycerides: 197 mg/dL — ABNORMAL HIGH (ref <150)

## 2020-08-06 LAB — HEPATITIS C ANTIBODY
Hepatitis C Ab: NONREACTIVE
SIGNAL TO CUT-OFF: 0.01 (ref <1.00)

## 2020-08-06 LAB — TSH: TSH: 3.55 mIU/L (ref 0.40–4.50)

## 2020-08-07 DIAGNOSIS — J301 Allergic rhinitis due to pollen: Secondary | ICD-10-CM | POA: Diagnosis not present

## 2020-08-07 DIAGNOSIS — J3089 Other allergic rhinitis: Secondary | ICD-10-CM | POA: Diagnosis not present

## 2020-08-21 DIAGNOSIS — T63461D Toxic effect of venom of wasps, accidental (unintentional), subsequent encounter: Secondary | ICD-10-CM | POA: Diagnosis not present

## 2020-08-21 DIAGNOSIS — T63451D Toxic effect of venom of hornets, accidental (unintentional), subsequent encounter: Secondary | ICD-10-CM | POA: Diagnosis not present

## 2020-08-27 DIAGNOSIS — J301 Allergic rhinitis due to pollen: Secondary | ICD-10-CM | POA: Diagnosis not present

## 2020-08-27 DIAGNOSIS — J3089 Other allergic rhinitis: Secondary | ICD-10-CM | POA: Diagnosis not present

## 2020-08-27 DIAGNOSIS — J3081 Allergic rhinitis due to animal (cat) (dog) hair and dander: Secondary | ICD-10-CM | POA: Diagnosis not present

## 2020-09-01 ENCOUNTER — Ambulatory Visit: Payer: 59 | Attending: Internal Medicine

## 2020-09-01 ENCOUNTER — Encounter: Payer: Self-pay | Admitting: Family Medicine

## 2020-09-01 DIAGNOSIS — Z23 Encounter for immunization: Secondary | ICD-10-CM

## 2020-09-01 NOTE — Progress Notes (Signed)
° °  Covid-19 Vaccination Clinic  Name:  BRYNDON CUMBIE    MRN: [id number] DOB: 28-Aug-1966  09/01/2020  Mr. Staup was observed post Covid-19 immunization for 30 minutes based on pre-vaccination screening without incident. He was provided with Vaccine Information Sheet and instruction to access the V-Safe system.   Mr. Tigert was instructed to call 911 with any severe reactions post vaccine:  Difficulty breathing   Swelling of face and throat   A fast heartbeat   A bad rash all over body   Dizziness and weakness

## 2020-09-03 ENCOUNTER — Other Ambulatory Visit: Payer: Self-pay | Admitting: Family Medicine

## 2020-09-03 MED FILL — ATORVASTATIN 20 MG TABLET: 20 | 90 days supply | Qty: 90 | Fill #0

## 2020-09-03 MED FILL — LEVOTHYROXINE 100 MCG TABLE: 100 | 90 days supply | Qty: 90 | Fill #0

## 2020-09-07 DIAGNOSIS — C61 Malignant neoplasm of prostate: Secondary | ICD-10-CM | POA: Diagnosis not present

## 2020-09-07 LAB — PSA: PSA: <0.015

## 2020-09-14 DIAGNOSIS — N5231 Erectile dysfunction following radical prostatectomy: Secondary | ICD-10-CM | POA: Diagnosis not present

## 2020-09-14 DIAGNOSIS — C61 Malignant neoplasm of prostate: Secondary | ICD-10-CM | POA: Diagnosis not present

## 2020-09-18 DIAGNOSIS — J301 Allergic rhinitis due to pollen: Secondary | ICD-10-CM | POA: Diagnosis not present

## 2020-09-18 DIAGNOSIS — J3089 Other allergic rhinitis: Secondary | ICD-10-CM | POA: Diagnosis not present

## 2020-09-21 DIAGNOSIS — T63461D Toxic effect of venom of wasps, accidental (unintentional), subsequent encounter: Secondary | ICD-10-CM | POA: Diagnosis not present

## 2020-09-21 DIAGNOSIS — T63451D Toxic effect of venom of hornets, accidental (unintentional), subsequent encounter: Secondary | ICD-10-CM | POA: Diagnosis not present

## 2020-09-24 ENCOUNTER — Encounter: Payer: Self-pay | Admitting: Family Medicine

## 2020-10-10 DIAGNOSIS — J301 Allergic rhinitis due to pollen: Secondary | ICD-10-CM | POA: Diagnosis not present

## 2020-10-10 DIAGNOSIS — J3089 Other allergic rhinitis: Secondary | ICD-10-CM | POA: Diagnosis not present

## 2020-10-15 DIAGNOSIS — T63461D Toxic effect of venom of wasps, accidental (unintentional), subsequent encounter: Secondary | ICD-10-CM | POA: Diagnosis not present

## 2020-10-15 DIAGNOSIS — T63451D Toxic effect of venom of hornets, accidental (unintentional), subsequent encounter: Secondary | ICD-10-CM | POA: Diagnosis not present

## 2020-10-16 DIAGNOSIS — H903 Sensorineural hearing loss, bilateral: Secondary | ICD-10-CM | POA: Diagnosis not present

## 2020-10-16 DIAGNOSIS — H9313 Tinnitus, bilateral: Secondary | ICD-10-CM | POA: Diagnosis not present

## 2020-10-29 DIAGNOSIS — J3089 Other allergic rhinitis: Secondary | ICD-10-CM | POA: Diagnosis not present

## 2020-10-29 DIAGNOSIS — J3081 Allergic rhinitis due to animal (cat) (dog) hair and dander: Secondary | ICD-10-CM | POA: Diagnosis not present

## 2020-10-29 DIAGNOSIS — J301 Allergic rhinitis due to pollen: Secondary | ICD-10-CM | POA: Diagnosis not present

## 2020-11-20 DIAGNOSIS — T63461D Toxic effect of venom of wasps, accidental (unintentional), subsequent encounter: Secondary | ICD-10-CM | POA: Diagnosis not present

## 2020-11-20 DIAGNOSIS — T63451D Toxic effect of venom of hornets, accidental (unintentional), subsequent encounter: Secondary | ICD-10-CM | POA: Diagnosis not present

## 2020-11-22 DIAGNOSIS — J3089 Other allergic rhinitis: Secondary | ICD-10-CM | POA: Diagnosis not present

## 2020-11-22 DIAGNOSIS — J301 Allergic rhinitis due to pollen: Secondary | ICD-10-CM | POA: Diagnosis not present

## 2020-11-27 ENCOUNTER — Encounter: Payer: Self-pay | Admitting: Family Medicine

## 2020-11-27 ENCOUNTER — Other Ambulatory Visit: Payer: Self-pay

## 2020-11-27 DIAGNOSIS — M722 Plantar fascial fibromatosis: Secondary | ICD-10-CM

## 2020-12-07 DIAGNOSIS — J3089 Other allergic rhinitis: Secondary | ICD-10-CM | POA: Diagnosis not present

## 2020-12-07 DIAGNOSIS — J3081 Allergic rhinitis due to animal (cat) (dog) hair and dander: Secondary | ICD-10-CM | POA: Diagnosis not present

## 2020-12-07 DIAGNOSIS — J301 Allergic rhinitis due to pollen: Secondary | ICD-10-CM | POA: Diagnosis not present

## 2020-12-19 DIAGNOSIS — T63451D Toxic effect of venom of hornets, accidental (unintentional), subsequent encounter: Secondary | ICD-10-CM | POA: Diagnosis not present

## 2020-12-19 DIAGNOSIS — T63441D Toxic effect of venom of bees, accidental (unintentional), subsequent encounter: Secondary | ICD-10-CM | POA: Diagnosis not present

## 2020-12-19 DIAGNOSIS — T63461D Toxic effect of venom of wasps, accidental (unintentional), subsequent encounter: Secondary | ICD-10-CM | POA: Diagnosis not present

## 2020-12-21 ENCOUNTER — Other Ambulatory Visit: Payer: Self-pay | Admitting: Family Medicine

## 2020-12-21 DIAGNOSIS — M722 Plantar fascial fibromatosis: Secondary | ICD-10-CM | POA: Diagnosis not present

## 2020-12-21 DIAGNOSIS — M79672 Pain in left foot: Secondary | ICD-10-CM | POA: Diagnosis not present

## 2020-12-21 DIAGNOSIS — M25561 Pain in right knee: Secondary | ICD-10-CM | POA: Diagnosis not present

## 2020-12-21 MED FILL — RIZATRIPTAN 10 MG ODT: 10 | 30 days supply | Qty: 12 | Fill #0

## 2020-12-21 MED FILL — LEVOTHYROXINE 100 MCG TABLE: 100 | 90 days supply | Qty: 90 | Fill #1

## 2020-12-21 MED FILL — ATORVASTATIN CALCIUM 20 MG: 20 | 90 days supply | Qty: 90 | Fill #1

## 2020-12-26 DIAGNOSIS — H903 Sensorineural hearing loss, bilateral: Secondary | ICD-10-CM | POA: Diagnosis not present

## 2020-12-27 ENCOUNTER — Ambulatory Visit: Payer: 59 | Admitting: Physician Assistant

## 2020-12-27 ENCOUNTER — Other Ambulatory Visit: Payer: Self-pay

## 2020-12-27 ENCOUNTER — Encounter: Payer: Self-pay | Admitting: Physician Assistant

## 2020-12-27 DIAGNOSIS — D225 Melanocytic nevi of trunk: Secondary | ICD-10-CM | POA: Diagnosis not present

## 2020-12-27 DIAGNOSIS — L578 Other skin changes due to chronic exposure to nonionizing radiation: Secondary | ICD-10-CM

## 2020-12-27 DIAGNOSIS — Z1283 Encounter for screening for malignant neoplasm of skin: Secondary | ICD-10-CM

## 2020-12-27 DIAGNOSIS — L814 Other melanin hyperpigmentation: Secondary | ICD-10-CM | POA: Diagnosis not present

## 2020-12-27 DIAGNOSIS — L403 Pustulosis palmaris et plantaris: Secondary | ICD-10-CM | POA: Diagnosis not present

## 2020-12-27 DIAGNOSIS — D1800 Hemangioma unspecified site: Secondary | ICD-10-CM

## 2020-12-27 DIAGNOSIS — D485 Neoplasm of uncertain behavior of skin: Secondary | ICD-10-CM | POA: Diagnosis not present

## 2020-12-27 DIAGNOSIS — D229 Melanocytic nevi, unspecified: Secondary | ICD-10-CM

## 2020-12-27 DIAGNOSIS — L821 Other seborrheic keratosis: Secondary | ICD-10-CM

## 2020-12-27 NOTE — Patient Instructions (Signed)

## 2020-12-31 DIAGNOSIS — J301 Allergic rhinitis due to pollen: Secondary | ICD-10-CM | POA: Diagnosis not present

## 2021-01-01 DIAGNOSIS — J3089 Other allergic rhinitis: Secondary | ICD-10-CM | POA: Diagnosis not present

## 2021-01-02 DIAGNOSIS — J3089 Other allergic rhinitis: Secondary | ICD-10-CM | POA: Diagnosis not present

## 2021-01-02 DIAGNOSIS — J301 Allergic rhinitis due to pollen: Secondary | ICD-10-CM | POA: Diagnosis not present

## 2021-01-04 DIAGNOSIS — J3089 Other allergic rhinitis: Secondary | ICD-10-CM | POA: Diagnosis not present

## 2021-01-04 DIAGNOSIS — J3081 Allergic rhinitis due to animal (cat) (dog) hair and dander: Secondary | ICD-10-CM | POA: Diagnosis not present

## 2021-01-04 DIAGNOSIS — J301 Allergic rhinitis due to pollen: Secondary | ICD-10-CM | POA: Diagnosis not present

## 2021-01-07 ENCOUNTER — Telehealth: Payer: Self-pay

## 2021-01-07 NOTE — Telephone Encounter (Signed)
-----   Message from Warren Danes, [place] sent at 01/07/2021  9:48 AM EST ----- Yearly exam. Rtc if recurs

## 2021-01-21 DIAGNOSIS — J3089 Other allergic rhinitis: Secondary | ICD-10-CM | POA: Diagnosis not present

## 2021-01-21 DIAGNOSIS — J301 Allergic rhinitis due to pollen: Secondary | ICD-10-CM | POA: Diagnosis not present

## 2021-01-22 ENCOUNTER — Encounter: Payer: Self-pay | Admitting: Physician Assistant

## 2021-01-22 NOTE — Progress Notes (Signed)
New Patient   Subjective  Aaron Gomez is a 55 y.o. male who presents for the following: Annual Exam (Lots of moles- left arm, back & abdomen- itch and burn , Bottom of left foot- bumps- no painful just itch ).   The following portions of the chart were reviewed this encounter and updated as appropriate:  Tobacco  Allergies  Meds  Problems  Med Hx  Surg Hx  Fam Hx      Objective  Well appearing patient in no apparent distress; mood and affect are within normal limits.  A full examination was performed including scalp, head, eyes, ears, nose, lips, neck, chest, axillae, abdomen, back, buttocks, bilateral upper extremities, bilateral lower extremities, hands, feet, fingers, toes, fingernails, and toenails. All findings within normal limits unless otherwise noted below.  Objective  Head to Toe: Head to toe exam today. Two irregular moles that will be biopsied today. No signs of non mole skin cancer.  Objective  Left Foot - Posterior, Right Foot - Posterior: Scale on pink base with small pustules  Objective  Left Abdomen (side) - Upper: Bichromic dark nested macule.        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; Right Foot - Posterior  Rx prn for pruritis  Neoplasm of uncertain behavior of skin (2) Left Abdomen (side) - Upper  Skin / nail biopsy Type of biopsy: tangential   Informed consent: discussed and consent obtained   Timeout: patient name, date of birth, surgical site, and procedure verified   Procedure prep:  Patient was prepped and draped in usual sterile fashion (Non sterile) Prep type:  Chlorhexidine Anesthesia: the lesion was anesthetized in a standard fashion   Anesthetic:  1% lidocaine w/ epinephrine 1-100,000 local infiltration Instrument used: flexible razor blade    Hemostasis achieved with: aluminum chloride and electrodesiccation   Outcome: patient tolerated procedure well   Post-procedure details: sterile dressing applied and wound care instructions given   Dressing type: bandage and petrolatum    Specimen 1 - Surgical pathology Differential Diagnosis: R/O Atypia  Check Margins: No  Cautery after biopsy  Left Forearm - Anterior  Skin / nail biopsy Type of biopsy: tangential   Informed consent: discussed and consent obtained   Timeout: patient name, date of birth, surgical site, and procedure verified   Procedure prep:  Patient was prepped and draped in usual sterile fashion (Non sterile) Prep type:  Chlorhexidine Anesthesia: the lesion was anesthetized in a standard fashion   Anesthetic:  1% lidocaine w/ epinephrine 1-100,000 local infiltration Instrument used: flexible razor blade   Hemostasis achieved with: aluminum chloride and electrodesiccation   Outcome: patient tolerated procedure well   Post-procedure details: sterile dressing applied and wound care instructions given   Dressing type: bandage and petrolatum    Specimen 2 - Surgical pathology Differential Diagnosis: R/O Atypia  Check Margins: No  Cautery after biopsy   Lentigines - Scattered tan macules - Discussed due to sun exposure - Benign, observe - Call for any changes  Seborrheic Keratoses - Stuck-on, waxy, tan-brown papules and plaques  - Discussed benign etiology and prognosis. - Observe - Call for any changes  Melanocytic Nevi - Tan-brown and/or pink-flesh-colored symmetric macules and papules - Benign appearing on exam today - Observation - Call  clinic for new or changing moles - Recommend daily use of broad spectrum spf 30+ sunscreen to sun-exposed areas.   Hemangiomas - Red papules - Discussed benign nature - Observe - Call for any changes  Actinic Damage - diffuse scaly erythematous macules with underlying dyspigmentation - Recommend daily  broad spectrum sunscreen SPF 30+ to sun-exposed areas, reapply every 2 hours as needed.  - Call for new or changing lesions.   I, Gaytha Raybourn, PA-C, have reviewed all documentation for this visit. The documentation on 01/22/21 for the exam, diagnosis, procedures, and orders are all accurate and complete.

## 2021-01-23 DIAGNOSIS — T63461D Toxic effect of venom of wasps, accidental (unintentional), subsequent encounter: Secondary | ICD-10-CM | POA: Diagnosis not present

## 2021-01-23 DIAGNOSIS — T63451D Toxic effect of venom of hornets, accidental (unintentional), subsequent encounter: Secondary | ICD-10-CM | POA: Diagnosis not present

## 2021-02-01 DIAGNOSIS — M722 Plantar fascial fibromatosis: Secondary | ICD-10-CM | POA: Diagnosis not present

## 2021-02-01 DIAGNOSIS — M79672 Pain in left foot: Secondary | ICD-10-CM | POA: Diagnosis not present

## 2021-02-01 DIAGNOSIS — M25561 Pain in right knee: Secondary | ICD-10-CM | POA: Diagnosis not present

## 2021-02-11 ENCOUNTER — Other Ambulatory Visit (HOSPITAL_BASED_OUTPATIENT_CLINIC_OR_DEPARTMENT_OTHER): Payer: Self-pay

## 2021-02-11 MED ORDER — BUDESONIDE-FORMOTEROL FUMARATE 80-4.5 MCG/ACT IN AERO
2.0000 | INHALATION_SPRAY | Freq: Two times a day (BID) | RESPIRATORY_TRACT | 4 refills | Status: DC
  Filled 2021-02-11: qty 10.2, 30d supply, fill #0
  Filled 2021-03-31: qty 10.2, 30d supply, fill #1

## 2021-02-12 DIAGNOSIS — J3089 Other allergic rhinitis: Secondary | ICD-10-CM | POA: Diagnosis not present

## 2021-02-12 DIAGNOSIS — J301 Allergic rhinitis due to pollen: Secondary | ICD-10-CM | POA: Diagnosis not present

## 2021-02-14 ENCOUNTER — Other Ambulatory Visit (HOSPITAL_BASED_OUTPATIENT_CLINIC_OR_DEPARTMENT_OTHER): Payer: Self-pay

## 2021-02-15 ENCOUNTER — Other Ambulatory Visit: Payer: Self-pay

## 2021-02-15 ENCOUNTER — Telehealth (INDEPENDENT_AMBULATORY_CARE_PROVIDER_SITE_OTHER): Payer: 59 | Admitting: Physician Assistant

## 2021-02-15 ENCOUNTER — Other Ambulatory Visit (HOSPITAL_BASED_OUTPATIENT_CLINIC_OR_DEPARTMENT_OTHER): Payer: Self-pay

## 2021-02-15 ENCOUNTER — Encounter: Payer: Self-pay | Admitting: Physician Assistant

## 2021-02-15 VITALS — HR 74 | Temp 99.0°F

## 2021-02-15 DIAGNOSIS — R6889 Other general symptoms and signs: Secondary | ICD-10-CM

## 2021-02-15 DIAGNOSIS — J4521 Mild intermittent asthma with (acute) exacerbation: Secondary | ICD-10-CM | POA: Diagnosis not present

## 2021-02-15 MED ORDER — PREDNISONE 20 MG PO TABS
40.0000 mg | ORAL_TABLET | Freq: Every day | ORAL | 0 refills | Status: DC
  Filled 2021-02-15: qty 10, 5d supply, fill #0

## 2021-02-15 MED ORDER — BENZONATATE 100 MG PO CAPS
100.0000 mg | ORAL_CAPSULE | Freq: Two times a day (BID) | ORAL | 1 refills | Status: DC | PRN
  Filled 2021-02-15: qty 30, 15d supply, fill #0

## 2021-02-15 MED ORDER — AZITHROMYCIN 250 MG PO TABS: ORAL_TABLET | ORAL | 0 refills | Status: DC

## 2021-02-15 MED ORDER — AZITHROMYCIN 250 MG PO TABS
ORAL_TABLET | ORAL | 0 refills | Status: DC
  Filled 2021-02-15: qty 6, 5d supply, fill #0

## 2021-02-15 MED ORDER — PREDNISONE 20 MG PO TABS: 40.0000 mg | ORAL_TABLET | Freq: Every day | ORAL | 0 refills | Status: DC

## 2021-02-15 MED ORDER — BENZONATATE 100 MG PO CAPS: 100.0000 mg | ORAL_CAPSULE | Freq: Two times a day (BID) | ORAL | 1 refills | Status: DC | PRN

## 2021-02-15 NOTE — Progress Notes (Signed)
Virtual Visit via Video   I connected with Aaron Gomez on 02/15/21 at 12:00 PM EDT by a video enabled telemedicine application and verified that I am speaking with the correct person using two identifiers. Location patient: Home Location provider: [name], Office Persons participating in the virtual visit: [place], [employer]artner PA-C  I discussed the limitations of evaluation and management by telemedicine and the availability of in person appointments. The patient expressed understanding and agreed to proceed.  Subjective:   HPI:   Patient is requesting evaluation for URI  Symptom onset: Wednesday  Travel/contacts: none   Vaccination status: Pfizer x 2 + booster x 1  Testing results: negative home covid test  Patient endorses the following symptoms: Cough, sore throat, low-grade fevers, fatigue, body aches Max temperature 100.2  Patient denies the following symptoms: Chest pain, shortness of breath, GI symptoms  Treatments tried: antitussives, hydration, inhalers: Symbicort and albuterol  Patient risk factors: Current IPJAS-50 risk of complications score: 2 Smoking status: ARTEM Aaron Gomez  reports that he quit smoking about 34 years ago. His smoking use included cigarettes. He has a 1.50 pack-year smoking history. He has never used smokeless tobacco. If male, currently pregnant? []   Yes []   No  ROS: See pertinent positives and negatives per HPI.  Patient Active Problem List   Diagnosis Date Noted  . Acute non-recurrent sinusitis 10/11/2018  . Genetic testing 02/26/2018  . Family history of breast cancer   . Family history of melanoma   . Family history of parotid cancer   . History of prostate cancer 01/26/2018  . Plantar fasciitis of right foot 01/17/2018  . Chronic nonintractable headache 10/19/2017  . Deviated septum 10/19/2017  . Nasal turbinate hypertrophy 10/19/2017  . Eczema 01/11/2016  . Hypothyroidism 07/06/2015  . Snoring 02/23/2015  .  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 TINEA PEDIS 09/25/2009  . Hyperlipidemia 01/12/2009  . Allergic rhinitis 01/12/2009  . Asthma 01/12/2009  . GERD 01/12/2009  . Common migraine 01/12/2009  . NEPHROLITHIASIS, HX OF 01/12/2009    Social History   Tobacco Use  . Smoking status: Former Smoker    Packs/day: 0.50    Years: 3.00    Pack years: 1.50    Types: Cigarettes    Quit date: 11/17/1986    Years since quitting: 34.2  . Smokeless tobacco: Never Used  . Tobacco comment: smoked 1980-1988, up to 1 ppd  Substance Use Topics  . Alcohol use: Yes    Comment: periodic     Current Outpatient Medications:  .  Albuterol Sulfate (PROAIR RESPICLICK) 539 (90 Base) MCG/ACT AEPB, Inhale 2 puffs into the lungs every 6 (six) hours as needed (shortness of breath)., Disp: , Rfl:  .  ASPIRIN 81 PO, Take 81 mg by mouth daily. , Disp: , Rfl:  .  atorvastatin (LIPITOR) 20 MG tablet, TAKE 1 TABLET (20 MG TOTAL) BY MOUTH DAILY., Disp: 90 tablet, Rfl: 3 .  baclofen (LIORESAL) 10 MG tablet, Take 1-2 tablets (10-20 mg total) by mouth 3 (three) times daily as needed for muscle spasms., Disp: 60 each, Rfl: 3 .  budesonide-formoterol (SYMBICORT) 80-4.5 MCG/ACT inhaler, Inhale 2 puffs into the lungs 2 (two) times daily., Disp: 10.2 g, Rfl: 4 .  cyclobenzaprine (FLEXERIL) 10 MG tablet, Take by mouth. , Disp: , Rfl:  .  EPINEPHrine 0.3 mg/0.3 mL IJ SOAJ injection, Inject 0.3 mg into the muscle once., Disp: , Rfl:  .  famotidine (PEPCID) 20 MG tablet, Take 20 mg  by mouth daily., Disp: , Rfl:  .  fexofenadine (ALLEGRA) 180 MG tablet, Take 180 mg by mouth daily. , Disp: , Rfl:  .  levothyroxine (SYNTHROID) 100 MCG tablet, TAKE 1 TABLET (100 MCG TOTAL) BY MOUTH DAILY BEFORE BREAKFAST., Disp: 90 tablet, Rfl: 3 .  nabumetone (RELAFEN) 750 MG tablet, Take 1 tablet (750 mg total) by mouth 2 (two) times daily as needed., Disp: 60 tablet, Rfl: 6 .  oxyCODONE-acetaminophen (PERCOCET) 10-325 MG tablet, Take 1 tablet by mouth every 6  (six) hours as needed for pain (migraine.)., Disp: 20 tablet, Rfl: 0 .  rizatriptan (MAXALT-MLT) 10 MG disintegrating tablet, TAKE 1 TABLET BY MOUTH EVERY DAY AS NEEDED FOR MIGRAINE. DISSOLVE IN MOUTH, Disp: 12 tablet, Rfl: 5 .  sildenafil (REVATIO) 20 MG tablet, Take 20-100 mg by mouth at bedtime., Disp: , Rfl:  .  tiZANidine (ZANAFLEX) 2 MG tablet, Take 1-2 tablets (2-4 mg total) by mouth at bedtime as needed for muscle spasms., Disp: 60 tablet, Rfl: 1 .  traMADol (ULTRAM) 50 MG tablet, Take 1 tablet (50 mg total) by mouth every 6 (six) hours as needed., Disp: 30 tablet, Rfl: 0 .  azithromycin (ZITHROMAX) 250 MG tablet, Take 2 tablets on day 1 then take 1 tablet daily for 4 days., Disp: 6 tablet, Rfl: 0 .  benzonatate (TESSALON) 100 MG capsule, Take 1 capsule (100 mg total) by mouth 2 (two) times daily as needed for cough., Disp: 30 capsule, Rfl: 1 .  fluticasone (FLONASE) 50 MCG/ACT nasal spray, Place 1 spray into the nose at bedtime. , Disp: , Rfl:  .  predniSONE (DELTASONE) 20 MG tablet, Take 2 tablets (40 mg total) by mouth daily., Disp: 10 tablet, Rfl: 0 .  scopolamine (TRANSDERM-SCOP) 1 MG/3DAYS, Place 1 patch (1.5 mg total) onto the skin every 3 (three) days. (Patient not taking: No sig reported), Disp: 10 patch, Rfl: 0  Allergies  Allergen Reactions  . Banana Anaphylaxis  . Bee Venom Anaphylaxis    Also venom from wasps & hornets cause anaphylaxis; he carries Epi-Pen & has Medi Alert bracelet  . Neomycin Sulfate Anaphylaxis    Anaphylactic shock  Because of a history of documented adverse serious drug reaction;Medi Alert bracelet  is recommended  . Strawberry Extract Anaphylaxis  . Tape Anaphylaxis    adhesvie tape-rash, blisters  . Venomil Mixed Vespid  [Mixed Vespid Venom] Anaphylaxis  . Neosporin [Bacitracin-Polymyxin B]   . Percocet [Oxycodone-Acetaminophen] Nausea Only    Post surgery    Objective:   VITALS: Per patient if applicable, see vitals. GENERAL: Alert, appears  well and in no acute distress. HEENT: Atraumatic, conjunctiva clear, no obvious abnormalities on inspection of external nose and ears. NECK: Normal movements of the head and neck. CARDIOPULMONARY: No increased WOB. Speaking in clear sentences. I:E ratio WNL.  MS: Moves all visible extremities without noticeable abnormality. PSYCH: Pleasant and cooperative, well-groomed. Speech normal rate and rhythm. Affect is appropriate. Insight and judgement are appropriate. Attention is focused, linear, and appropriate.  NEURO: CN grossly intact. Oriented as arrived to appointment on time with no prompting. Moves both UE equally.  SKIN: No obvious lesions, wounds, erythema, or cyanosis noted on face or hands.  Assessment and Plan:   Graceson was seen today for cough, fever, nasal congestion, fatigue and generalized body aches.  Diagnoses and all orders for this visit:  Mild intermittent asthma with acute exacerbation He states his pulse ox on his phone was reading about 95%.  He is coughing throughout  our encounter.  He does not appear to be in any distress. We will go ahead and start prednisone 20 mg x 5 days for possible asthma exacerbation. I have also sent in [facility] for patient. We also discussed possibly coming in for flu test however he does have a history of sinusitis and bronchitis so we will go ahead and empirically start azithromycin for these issues.  We deferred testing today. Did discuss that if any symptoms worsen or do not improve with treatment, will need to be seen in office somewhere where he can have a long evaluation and vitals taken. Worsening precautions advised.  Other orders -     azithromycin (ZITHROMAX) 250 MG tablet; Take 2 tablets on day 1 then take 1 tablet daily for 4 days. -     benzonatate (TESSALON) 100 MG capsule; Take 1 capsule (100 mg total) by mouth 2 (two) times daily as needed for cough. -     predniSONE (DELTASONE) 20 MG tablet; Take 2 tablets (40 mg total)  by mouth daily.   I discussed the assessment and treatment plan with the patient. The patient was provided an opportunity to ask questions and all were answered. The patient agreed with the plan and demonstrated an understanding of the instructions.   The patient was advised to call back or seek an in-person evaluation if the symptoms worsen or if the condition fails to improve as anticipated.    [place], [place] 02/15/2021

## 2021-02-22 DIAGNOSIS — T63441D Toxic effect of venom of bees, accidental (unintentional), subsequent encounter: Secondary | ICD-10-CM | POA: Diagnosis not present

## 2021-02-22 DIAGNOSIS — T63461D Toxic effect of venom of wasps, accidental (unintentional), subsequent encounter: Secondary | ICD-10-CM | POA: Diagnosis not present

## 2021-03-05 DIAGNOSIS — J301 Allergic rhinitis due to pollen: Secondary | ICD-10-CM | POA: Diagnosis not present

## 2021-03-05 DIAGNOSIS — J3089 Other allergic rhinitis: Secondary | ICD-10-CM | POA: Diagnosis not present

## 2021-03-07 ENCOUNTER — Encounter: Payer: Self-pay | Admitting: Family Medicine

## 2021-03-14 ENCOUNTER — Other Ambulatory Visit: Payer: Self-pay

## 2021-03-14 ENCOUNTER — Ambulatory Visit: Payer: 59 | Attending: Internal Medicine

## 2021-03-14 ENCOUNTER — Other Ambulatory Visit (HOSPITAL_BASED_OUTPATIENT_CLINIC_OR_DEPARTMENT_OTHER): Payer: Self-pay

## 2021-03-14 ENCOUNTER — Other Ambulatory Visit (HOSPITAL_COMMUNITY): Payer: Self-pay

## 2021-03-14 DIAGNOSIS — Z23 Encounter for immunization: Secondary | ICD-10-CM

## 2021-03-14 MED ORDER — PFIZER-BIONT COVID-19 VAC-TRIS 30 MCG/0.3ML IM SUSP
INTRAMUSCULAR | 0 refills | Status: DC
  Filled 2021-03-14: qty 0.3, 1d supply, fill #0

## 2021-03-14 NOTE — Progress Notes (Signed)
   Covid-19 Vaccination Clinic  Name:  Aaron Gomez    MRN: [id number] DOB: 09-Mar-1966  03/14/2021  Aaron Gomez was observed post Covid-19 immunization for 15 minutes without incident. He was provided with Vaccine Information Sheet and instruction to access the V-Safe system.   Aaron Gomez was instructed to call 911 with any severe reactions post vaccine: [name] Difficulty breathing  . Swelling of face and throat  . A fast heartbeat  . A bad rash all over body  . Dizziness and weakness   Immunizations Administered    Name Date Dose VIS Date Route   PFIZER Comrnaty(Gray TOP) Covid-19 Vaccine 03/14/2021 10:29 AM 0.3 mL 10/25/2020 Intramuscular   Manufacturer: [place]   Lot: [id number]   [place]: [phone]

## 2021-03-20 DIAGNOSIS — M25861 Other specified joint disorders, right knee: Secondary | ICD-10-CM | POA: Diagnosis not present

## 2021-03-20 DIAGNOSIS — M1711 Unilateral primary osteoarthritis, right knee: Secondary | ICD-10-CM | POA: Diagnosis not present

## 2021-03-20 DIAGNOSIS — M25561 Pain in right knee: Secondary | ICD-10-CM | POA: Diagnosis not present

## 2021-03-20 DIAGNOSIS — M23300 Other meniscus derangements, unspecified lateral meniscus, right knee: Secondary | ICD-10-CM | POA: Diagnosis not present

## 2021-03-31 MED FILL — Atorvastatin Calcium Tab 20 MG (Base Equivalent): ORAL | 90 days supply | Qty: 90 | Fill #0 | Status: CN

## 2021-03-31 MED FILL — Levothyroxine Sodium Tab 100 MCG: ORAL | 90 days supply | Qty: 90 | Fill #0 | Status: CN

## 2021-04-01 ENCOUNTER — Other Ambulatory Visit (HOSPITAL_BASED_OUTPATIENT_CLINIC_OR_DEPARTMENT_OTHER): Payer: Self-pay

## 2021-04-01 ENCOUNTER — Other Ambulatory Visit (HOSPITAL_COMMUNITY): Payer: Self-pay

## 2021-04-01 DIAGNOSIS — J301 Allergic rhinitis due to pollen: Secondary | ICD-10-CM | POA: Diagnosis not present

## 2021-04-01 DIAGNOSIS — J3089 Other allergic rhinitis: Secondary | ICD-10-CM | POA: Diagnosis not present

## 2021-04-01 MED FILL — Atorvastatin Calcium Tab 20 MG (Base Equivalent): ORAL | 90 days supply | Qty: 90 | Fill #0 | Status: AC

## 2021-04-01 MED FILL — Levothyroxine Sodium Tab 100 MCG: ORAL | 90 days supply | Qty: 90 | Fill #1 | Status: CN

## 2021-04-01 MED FILL — Atorvastatin Calcium Tab 20 MG (Base Equivalent): ORAL | 90 days supply | Qty: 90 | Fill #1 | Status: CN

## 2021-04-01 MED FILL — Levothyroxine Sodium Tab 100 MCG: ORAL | 90 days supply | Qty: 90 | Fill #0 | Status: AC

## 2021-04-02 DIAGNOSIS — C61 Malignant neoplasm of prostate: Secondary | ICD-10-CM | POA: Diagnosis not present

## 2021-04-12 DIAGNOSIS — N5231 Erectile dysfunction following radical prostatectomy: Secondary | ICD-10-CM | POA: Diagnosis not present

## 2021-04-12 DIAGNOSIS — C61 Malignant neoplasm of prostate: Secondary | ICD-10-CM | POA: Diagnosis not present

## 2021-04-17 ENCOUNTER — Other Ambulatory Visit (HOSPITAL_COMMUNITY): Payer: Self-pay

## 2021-04-17 DIAGNOSIS — T63451D Toxic effect of venom of hornets, accidental (unintentional), subsequent encounter: Secondary | ICD-10-CM | POA: Diagnosis not present

## 2021-04-17 DIAGNOSIS — J301 Allergic rhinitis due to pollen: Secondary | ICD-10-CM | POA: Diagnosis not present

## 2021-04-17 DIAGNOSIS — J3089 Other allergic rhinitis: Secondary | ICD-10-CM | POA: Diagnosis not present

## 2021-04-17 DIAGNOSIS — T63461D Toxic effect of venom of wasps, accidental (unintentional), subsequent encounter: Secondary | ICD-10-CM | POA: Diagnosis not present

## 2021-04-17 DIAGNOSIS — J45990 Exercise induced bronchospasm: Secondary | ICD-10-CM | POA: Diagnosis not present

## 2021-04-17 DIAGNOSIS — Z91018 Allergy to other foods: Secondary | ICD-10-CM | POA: Diagnosis not present

## 2021-04-17 MED ORDER — ALBUTEROL SULFATE 108 (90 BASE) MCG/ACT IN AEPB
INHALATION_SPRAY | RESPIRATORY_TRACT | 0 refills | Status: DC
  Filled 2021-04-17: qty 1, 30d supply, fill #0

## 2021-04-17 MED ORDER — BUDESONIDE-FORMOTEROL FUMARATE 80-4.5 MCG/ACT IN AERO
INHALATION_SPRAY | RESPIRATORY_TRACT | 5 refills | Status: DC
  Filled 2021-04-17: qty 10.2, 30d supply, fill #0

## 2021-04-22 ENCOUNTER — Other Ambulatory Visit (HOSPITAL_COMMUNITY): Payer: Self-pay

## 2021-04-22 DIAGNOSIS — J301 Allergic rhinitis due to pollen: Secondary | ICD-10-CM | POA: Diagnosis not present

## 2021-04-22 DIAGNOSIS — J3089 Other allergic rhinitis: Secondary | ICD-10-CM | POA: Diagnosis not present

## 2021-04-23 ENCOUNTER — Other Ambulatory Visit (HOSPITAL_COMMUNITY): Payer: Self-pay

## 2021-04-23 ENCOUNTER — Other Ambulatory Visit (HOSPITAL_BASED_OUTPATIENT_CLINIC_OR_DEPARTMENT_OTHER): Payer: Self-pay

## 2021-04-23 MED ORDER — ALBUTEROL SULFATE HFA 108 (90 BASE) MCG/ACT IN AERS
INHALATION_SPRAY | RESPIRATORY_TRACT | 0 refills | Status: DC
  Filled 2021-04-23: qty 8.5, 17d supply, fill #0

## 2021-04-23 MED ORDER — BUDESONIDE-FORMOTEROL FUMARATE 80-4.5 MCG/ACT IN AERO
INHALATION_SPRAY | RESPIRATORY_TRACT | 5 refills | Status: DC
  Filled 2021-04-23 (×2): qty 10.2, 30d supply, fill #0

## 2021-04-23 MED ORDER — OPTICHAMBER DIAMOND MISC
1 refills | Status: AC
  Filled 2021-04-23: qty 1, 30d supply, fill #0

## 2021-04-23 MED ORDER — ALBUTEROL SULFATE 108 (90 BASE) MCG/ACT IN AEPB
INHALATION_SPRAY | RESPIRATORY_TRACT | 0 refills | Status: DC
  Filled 2021-04-23: qty 1, 30d supply, fill #0

## 2021-04-23 MED ORDER — BUDESONIDE-FORMOTEROL FUMARATE 80-4.5 MCG/ACT IN AERO: 2.0000 | INHALATION_SPRAY | Freq: Two times a day (BID) | RESPIRATORY_TRACT | 5 refills | Status: DC

## 2021-04-24 ENCOUNTER — Other Ambulatory Visit (HOSPITAL_COMMUNITY): Payer: Self-pay

## 2021-04-24 ENCOUNTER — Other Ambulatory Visit (HOSPITAL_BASED_OUTPATIENT_CLINIC_OR_DEPARTMENT_OTHER): Payer: Self-pay

## 2021-05-08 DIAGNOSIS — M25561 Pain in right knee: Secondary | ICD-10-CM | POA: Diagnosis not present

## 2021-05-14 DIAGNOSIS — J3089 Other allergic rhinitis: Secondary | ICD-10-CM | POA: Diagnosis not present

## 2021-05-14 DIAGNOSIS — J301 Allergic rhinitis due to pollen: Secondary | ICD-10-CM | POA: Diagnosis not present

## 2021-05-21 DIAGNOSIS — T63461D Toxic effect of venom of wasps, accidental (unintentional), subsequent encounter: Secondary | ICD-10-CM | POA: Diagnosis not present

## 2021-05-21 DIAGNOSIS — T63451D Toxic effect of venom of hornets, accidental (unintentional), subsequent encounter: Secondary | ICD-10-CM | POA: Diagnosis not present

## 2021-06-04 DIAGNOSIS — J3089 Other allergic rhinitis: Secondary | ICD-10-CM | POA: Diagnosis not present

## 2021-06-04 DIAGNOSIS — J301 Allergic rhinitis due to pollen: Secondary | ICD-10-CM | POA: Diagnosis not present

## 2021-06-05 DIAGNOSIS — M9902 Segmental and somatic dysfunction of thoracic region: Secondary | ICD-10-CM | POA: Diagnosis not present

## 2021-06-05 DIAGNOSIS — M25512 Pain in left shoulder: Secondary | ICD-10-CM | POA: Diagnosis not present

## 2021-06-05 DIAGNOSIS — M5413 Radiculopathy, cervicothoracic region: Secondary | ICD-10-CM | POA: Diagnosis not present

## 2021-06-05 DIAGNOSIS — S46012A Strain of muscle(s) and tendon(s) of the rotator cuff of left shoulder, initial encounter: Secondary | ICD-10-CM | POA: Diagnosis not present

## 2021-06-05 DIAGNOSIS — M9901 Segmental and somatic dysfunction of cervical region: Secondary | ICD-10-CM | POA: Diagnosis not present

## 2021-06-05 DIAGNOSIS — M62830 Muscle spasm of back: Secondary | ICD-10-CM | POA: Diagnosis not present

## 2021-06-07 DIAGNOSIS — M9902 Segmental and somatic dysfunction of thoracic region: Secondary | ICD-10-CM | POA: Diagnosis not present

## 2021-06-07 DIAGNOSIS — M62830 Muscle spasm of back: Secondary | ICD-10-CM | POA: Diagnosis not present

## 2021-06-07 DIAGNOSIS — M5413 Radiculopathy, cervicothoracic region: Secondary | ICD-10-CM | POA: Diagnosis not present

## 2021-06-07 DIAGNOSIS — S46012A Strain of muscle(s) and tendon(s) of the rotator cuff of left shoulder, initial encounter: Secondary | ICD-10-CM | POA: Diagnosis not present

## 2021-06-07 DIAGNOSIS — M25512 Pain in left shoulder: Secondary | ICD-10-CM | POA: Diagnosis not present

## 2021-06-07 DIAGNOSIS — M9901 Segmental and somatic dysfunction of cervical region: Secondary | ICD-10-CM | POA: Diagnosis not present

## 2021-06-10 DIAGNOSIS — M9902 Segmental and somatic dysfunction of thoracic region: Secondary | ICD-10-CM | POA: Diagnosis not present

## 2021-06-10 DIAGNOSIS — M9901 Segmental and somatic dysfunction of cervical region: Secondary | ICD-10-CM | POA: Diagnosis not present

## 2021-06-10 DIAGNOSIS — M25512 Pain in left shoulder: Secondary | ICD-10-CM | POA: Diagnosis not present

## 2021-06-10 DIAGNOSIS — M5413 Radiculopathy, cervicothoracic region: Secondary | ICD-10-CM | POA: Diagnosis not present

## 2021-06-10 DIAGNOSIS — S46012A Strain of muscle(s) and tendon(s) of the rotator cuff of left shoulder, initial encounter: Secondary | ICD-10-CM | POA: Diagnosis not present

## 2021-06-10 DIAGNOSIS — M62830 Muscle spasm of back: Secondary | ICD-10-CM | POA: Diagnosis not present

## 2021-06-19 DIAGNOSIS — M25512 Pain in left shoulder: Secondary | ICD-10-CM | POA: Diagnosis not present

## 2021-06-27 ENCOUNTER — Other Ambulatory Visit (HOSPITAL_BASED_OUTPATIENT_CLINIC_OR_DEPARTMENT_OTHER): Payer: Self-pay

## 2021-06-27 DIAGNOSIS — J301 Allergic rhinitis due to pollen: Secondary | ICD-10-CM | POA: Diagnosis not present

## 2021-06-27 DIAGNOSIS — J3089 Other allergic rhinitis: Secondary | ICD-10-CM | POA: Diagnosis not present

## 2021-06-27 MED FILL — Atorvastatin Calcium Tab 20 MG (Base Equivalent): ORAL | 90 days supply | Qty: 90 | Fill #1 | Status: AC

## 2021-06-27 MED FILL — Levothyroxine Sodium Tab 100 MCG: ORAL | 90 days supply | Qty: 90 | Fill #1 | Status: AC

## 2021-07-08 ENCOUNTER — Other Ambulatory Visit (HOSPITAL_BASED_OUTPATIENT_CLINIC_OR_DEPARTMENT_OTHER): Payer: Self-pay

## 2021-07-08 MED ORDER — ALBUTEROL SULFATE HFA 108 (90 BASE) MCG/ACT IN AERS
INHALATION_SPRAY | RESPIRATORY_TRACT | 0 refills | Status: DC
  Filled 2021-07-08: qty 18, 30d supply, fill #0

## 2021-07-08 MED FILL — Rizatriptan Benzoate Oral Disintegrating Tab 10 MG (Base Eq): ORAL | 30 days supply | Qty: 12 | Fill #0 | Status: AC

## 2021-07-16 DIAGNOSIS — J301 Allergic rhinitis due to pollen: Secondary | ICD-10-CM | POA: Diagnosis not present

## 2021-07-16 DIAGNOSIS — J3089 Other allergic rhinitis: Secondary | ICD-10-CM | POA: Diagnosis not present

## 2021-07-19 ENCOUNTER — Other Ambulatory Visit (HOSPITAL_BASED_OUTPATIENT_CLINIC_OR_DEPARTMENT_OTHER): Payer: Self-pay

## 2021-07-19 DIAGNOSIS — Z91018 Allergy to other foods: Secondary | ICD-10-CM | POA: Diagnosis not present

## 2021-07-19 DIAGNOSIS — J301 Allergic rhinitis due to pollen: Secondary | ICD-10-CM | POA: Diagnosis not present

## 2021-07-19 DIAGNOSIS — J3089 Other allergic rhinitis: Secondary | ICD-10-CM | POA: Diagnosis not present

## 2021-07-19 DIAGNOSIS — J0100 Acute maxillary sinusitis, unspecified: Secondary | ICD-10-CM | POA: Diagnosis not present

## 2021-07-19 MED ORDER — CEFDINIR 300 MG PO CAPS
ORAL_CAPSULE | ORAL | 5 refills | Status: DC
  Filled 2021-07-19: qty 20, 10d supply, fill #0

## 2021-07-19 MED ORDER — PREDNISONE 10 MG PO TABS
ORAL_TABLET | ORAL | 0 refills | Status: DC
  Filled 2021-07-19: qty 10, 4d supply, fill #0

## 2021-08-01 NOTE — Progress Notes (Signed)
Phone: [phone]   Subjective:  Patient presents today for their annual physical. Chief complaint-noted.   See problem oriented charting- ROS- full  review of systems was completed and negative  except for: some shoulder and knee issues, sinus pressure, seasonal allergies- still seeing Dr. Orvil Feil  The following were reviewed and entered/updated in epic: Past Medical History:  Diagnosis Date   Allergy    venom anaphylaxis   Asthma    extrinsic   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   Complication of anesthesia    slow to wake up    Family history of breast cancer    Family history of melanoma    Family history of parotid cancer    GERD (gastroesophageal reflux disease)    Headache(784.0)    hx of migraines    History of kidney stones    Hyperlipidemia    Hypothyroidism    Nephrolithiasis     X 8,Dr Tannebaum   PONV (postoperative nausea and vomiting)    Prostate cancer ([place])    Tuberculosis    hx of ox 1    Patient Active Problem List   Diagnosis Date Noted   History of prostate cancer 01/26/2018    Priority: High   Hypothyroidism 07/06/2015    Priority: Medium   Anaphylaxis 05/23/2011    Priority: Medium   Hyperlipidemia 01/12/2009    Priority: Medium   Asthma 01/12/2009    Priority: Medium   Common migraine 01/12/2009    Priority: Medium   Genetic testing 02/26/2018    Priority: Low   Family history of breast cancer     Priority: Low   Family history of melanoma     Priority: Low   Family history of parotid cancer     Priority: Low   Plantar fasciitis of right foot 01/17/2018    Priority: Low   Eczema 01/11/2016    Priority: Low   Snoring 02/23/2015    Priority: Low   TINEA PEDIS 09/25/2009    Priority: Low   Allergic rhinitis 01/12/2009    Priority: Low   GERD 01/12/2009    Priority: Low   NEPHROLITHIASIS, HX OF 01/12/2009    Priority: Low   Acute non-recurrent sinusitis 10/11/2018   Chronic nonintractable headache 10/19/2017   Deviated  septum 10/19/2017   Nasal turbinate hypertrophy 10/19/2017   Past Surgical History:  Procedure Laterality Date   CERVICAL SPINE SURGERY  11/2019   Dr. Annette Stable, disc replacements   COLONOSCOPY  2017   ELBOW SURGERY     [facility]   KNEE ARTHROSCOPY  2011   R knee; Dr Maureen Ralphs   LUMBAR FUSION  2004   Dr Trenton Gammon   LYMPHADENECTOMY Bilateral 03/25/2018   Procedure: LYMPHADENECTOMY, PELVIC;  Surgeon: Raynelle Bring, MD;  Location: [facility];  Service: Urology;  Laterality: Bilateral;   MICRODISCECTOMY LUMBAR  1999   L4-5,L5-S1;Dr. Poole   NASAL SEPTOPLASTY W/ TURBINOPLASTY     ROBOT ASSISTED LAPAROSCOPIC RADICAL PROSTATECTOMY N/A 03/25/2018   Procedure: XI ROBOTIC ASSISTED LAPAROSCOPIC RADICAL PROSTATECTOMY LEVEL 2;  Surgeon: Raynelle Bring, MD;  Location: [facility];  Service: Urology;  Laterality: N/A;   TONSILLECTOMY     VASECTOMY  2000    Family History  Problem Relation Age of Onset   Heart attack Father 30       died @ 23   Thyroid disease Mother    Heart attack Brother 44       1/2 brother   Crohn's disease  Brother    Hypertension Paternal Grandmother    Heart disease Paternal Grandmother        MI in 42s   Diabetes Paternal Grandmother    Heart attack Paternal Grandfather 6   Heart attack Paternal Aunt 63   Breast cancer Sister 57   Melanoma Maternal Aunt    Cancer Maternal Grandfather        parotid mixed tumor- met lung   Colon cancer Neg Hx    Esophageal cancer Neg Hx    Pancreatic cancer Neg Hx    Prostate cancer Neg Hx    Rectal cancer Neg Hx    Stomach cancer Neg Hx     Medications- reviewed and updated Current Outpatient Medications  Medication Sig Dispense Refill   albuterol (VENTOLIN HFA) 108 (90 Base) MCG/ACT inhaler Inhale 1-2 puffs every 4-6 hours as needed for cough/wheeze 18 g 0   Albuterol Sulfate (PROAIR RESPICLICK) 038 (90 Base) MCG/ACT AEPB Inhale 2 puffs into the lungs every 6 (six) hours as needed (shortness of breath).     Albuterol Sulfate (PROAIR  RESPICLICK) 333 (90 Base) MCG/ACT AEPB Inhale 2 puffs by mouth every 4-6 hours as needed 1 each 0   Albuterol Sulfate (PROAIR RESPICLICK) 832 (90 Base) MCG/ACT AEPB Use 2 puffs every 4-6 hours as needed for cough/wheeze 1 each 0   ASPIRIN 81 PO Take 81 mg by mouth daily.      atorvastatin (LIPITOR) 20 MG tablet TAKE 1 TABLET (20 MG TOTAL) BY MOUTH DAILY. 90 tablet 3   baclofen (LIORESAL) 10 MG tablet Take 1-2 tablets (10-20 mg total) by mouth 3 (three) times daily as needed for muscle spasms. 60 each 3   budesonide-formoterol (SYMBICORT) 80-4.5 MCG/ACT inhaler Inhale 2 puffs into the lungs 2 (two) times daily. 10.2 g 4   budesonide-formoterol (SYMBICORT) 80-4.5 MCG/ACT inhaler Inhale 2 puffs into the lungs 2 times a day 10.2 g 5   budesonide-formoterol (SYMBICORT) 80-4.5 MCG/ACT inhaler Inhale into the lungs 2 puffs twice daily. 10.2 g 5   budesonide-formoterol (SYMBICORT) 80-4.5 MCG/ACT inhaler Inhale 2 puffs into the lungs 2 (two) times daily. 10.2 g 5   cyclobenzaprine (FLEXERIL) 10 MG tablet Take by mouth.      EPINEPHrine 0.3 mg/0.3 mL IJ SOAJ injection Inject 0.3 mg into the muscle once.     famotidine (PEPCID) 20 MG tablet Take 20 mg by mouth daily.     fexofenadine (ALLEGRA) 180 MG tablet Take 180 mg by mouth daily.      levothyroxine (SYNTHROID) 100 MCG tablet TAKE 1 TABLET (100 MCG TOTAL) BY MOUTH DAILY BEFORE BREAKFAST. 90 tablet 3   nabumetone (RELAFEN) 750 MG tablet Take 1 tablet (750 mg total) by mouth 2 (two) times daily as needed. 60 tablet 6   oxyCODONE-acetaminophen (PERCOCET) 10-325 MG tablet Take 1 tablet by mouth every 6 (six) hours as needed for pain (migraine.). 20 tablet 0   rizatriptan (MAXALT-MLT) 10 MG disintegrating tablet DISSOLVE 1 TABLET IN MOUTH ONCE DAILY AS NEEDED FOR MIGRAINE. 12 tablet 5   scopolamine (TRANSDERM-SCOP) 1 MG/3DAYS Place 1 patch (1.5 mg total) onto the skin every 3 (three) days. 10 patch 0   sildenafil (REVATIO) 20 MG tablet Take 20-100 mg by mouth  at bedtime.     Spacer/Aero-Holding Chambers [facility] DIAMOND) MISC Use as directed 1 each 1   tiZANidine (ZANAFLEX) 2 MG tablet Take 1-2 tablets (2-4 mg total) by mouth at bedtime as needed for muscle spasms. 60 tablet 1  traMADol (ULTRAM) 50 MG tablet Take 1 tablet (50 mg total) by mouth every 6 (six) hours as needed. 30 tablet 0   azithromycin (ZITHROMAX) 250 MG tablet Take 2 tablets on day 1 then take 1 tablet daily for 4 days. (Patient not taking: Reported on 08/05/2021) 6 tablet 0   benzonatate (TESSALON) 100 MG capsule Take 1 capsule (100 mg total) by mouth 2 (two) times daily as needed for cough. (Patient not taking: Reported on 08/05/2021) 30 capsule 1   cefdinir (OMNICEF) 300 MG capsule Take 1 capsule by mouth Twice a day 10 day(s) (Patient not taking: Reported on 08/05/2021) 20 capsule 5   COVID-19 mRNA Vac-TriS, Pfizer, (PFIZER-BIONT COVID-19 VAC-TRIS) SUSP injection Inject into the muscle. (Patient not taking: Reported on 08/05/2021) 0.3 mL 0   fluticasone (FLONASE) 50 MCG/ACT nasal spray Place 1 spray into the nose at bedtime.      predniSONE (DELTASONE) 10 MG tablet Take 4 tablets by mouth on day 1, 3 tablets daily on day 2, 2 tablets daily on day 3, 1 tablet daily on day 4 (Patient not taking: Reported on 08/05/2021) 10 tablet 0   predniSONE (DELTASONE) 20 MG tablet Take 2 tablets (40 mg total) by mouth daily. (Patient not taking: Reported on 08/05/2021) 10 tablet 0   No current facility-administered medications for this visit.    Allergies-reviewed and updated Allergies  Allergen Reactions   Banana Anaphylaxis   Bee Venom Anaphylaxis    Also venom from wasps & hornets cause anaphylaxis; he carries Epi-Pen & has Medi Alert bracelet   Neomycin Sulfate Anaphylaxis    Anaphylactic shock  Because of a history of documented adverse serious drug reaction;Medi Alert bracelet  is recommended   Strawberry Extract Anaphylaxis   Tape Anaphylaxis    adhesvie tape-rash, blisters    Venomil Mixed Vespid  [Mixed Vespid Venom] Anaphylaxis   Neosporin [Bacitracin-Polymyxin B]    Percocet [Oxycodone-Acetaminophen] Nausea Only    Post surgery    Social History   Social History Narrative   Married. 2 kids-son and daughter. Daughter in [place]- was going to do PA school- now doing executive nannying. Son [employer] grad- Recruitment consultant in [place].       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 19 scouts   Objective  Objective:  BP 118/84   Pulse 67   Temp 98 F (36.7 C) (Temporal)   Wt 202 lb 6.4 oz (91.8 kg)   SpO2 96%   BMI 27.45 kg/m  Gen: NAD, resting comfortably HEENT: Mucous membranes are moist. Oropharynx normal Neck: no thyromegaly CV: RRR no murmurs rubs or gallops Lungs: CTAB no crackles, wheeze, rhonchi Abdomen: soft/nontender/nondistended/normal bowel sounds. No rebound or guarding.  Ext: no edema Skin: warm, dry Neuro: grossly normal, moves all extremities, PERRLA   Assessment and Plan  55 y.o. male presenting for annual physical.  Health Maintenance counseling: 1. Anticipatory guidance: Patient counseled regarding regular dental exams -q6 months, eye exams- -yearly,  avoiding smoking and second hand smoke , limiting alcohol to 2 beverages per day . No illicit drugs .   2. Risk factor reduction:  Advised patient of need for regular exercise and diet rich and fruits and vegetables to reduce risk of heart attack and stroke. Exercise- just started back running- knee and shoulder had been bothering him.  Golf one or two days a week.  Diet-  trying to eat healthy diet- feels like portion control is an issue for him- feels hungry if eats less though so has been a challenge- can get migraines.  Down 1 pound from last year. Working on [employer]. Kitchen remodel right now so particularly hard.  Wt  Readings from Last 3 Encounters:  08/05/21 202 lb 6.4 oz (91.8 kg)  08/03/20 203 lb (92.1 kg)  02/06/20 203 lb (92.1 kg)  3. Immunizations/screenings/ancillary studies DISCUSSED:  - flu vaccination (last one 08/03/2020) - plan today -discussed omicron booster at pharmacy Immunization History  Administered Date(s) Administered   Influenza,inj,Quad PF,6+ Mos 08/17/2013, 08/02/2019, 08/03/2020   Influenza-Unspecified 11/02/2015, 08/22/2016, 08/31/2017, 08/30/2018   PFIZER Comirnaty(Gray Top)Covid-19 Tri-Sucrose Vaccine 03/14/2021   PFIZER(Purple Top)SARS-COV-2 Vaccination 02/07/2020, 02/28/2020, 09/01/2020   Td 11/17/2001   Tdap 02/02/2012   Zoster Recombinat (Shingrix) 07/29/2018, 10/12/2018   4. Prostate cancer screening-history of prostatectomy with Dr. Alinda Money.  Gleason was 5+4 = 9.  PSAs have significantly trended down-continued to be monitored by urology- states undetectable on last check- q6 month follow up  Lab Results  Component Value Date   PSA <0.015 09/07/2020   PSA 0.015 06/25/2018   PSA 5.82 01/22/2018   5. Colon cancer screening -  colonoscopy 09/10/2016 with 10-year follow-up planned 6. Skin cancer screening--[facility] within last year- good report.- not cancerous or precancerous. advised regular sunscreen use. Denies worrisome, changing, or new skin lesions.  7. Former smoker- 1.5 pack years quit in the 1980s.  No regular screening planned other than AAA scan at age 76 .  Also offered urine- does this with urology  8. STD screening - opts out of monogamous  Status of chronic or acute concerns   # pain in left shoulder-  saw ortho for this - some inflammation- got an injection and helped for a few weeks. Doing some cross symmetry with bands to try to help  # Pain in right knee - had ortho eval and was told inflammation  # Mild intermittent asthma w/ acute exacerbation S:Video visit with Inda Coke. PA on 02/15/2021 for an evaluation for URI.Symptoms  started 02/14/2021 of cough, sore throat, low-grade fevers, fatigue an body aches. Temperature was 100.9F and had a negative home covid test. Denied chest pain, shortness of breath or GI symptoms.  -Deferred testing -started Prednisone 20 mg daily for 5 days and Tessalon Perles 100 mg as needed and Zithromax 250 mg daily for 4 days.   #GFR monitoring-slight decrease in GFR last year.  Discussed avoiding NSAIDs.  Could also be thrown off by muscle mass- was fine last year    #hyperlipidemia S: Medication: atorvastatin 20Mg daily -trying to reduce sugars and starches due to prediabetes risk Lab Results  Component Value Date   CHOL 182 08/03/2020   HDL 49 08/03/2020   LDLCALC 101 (H) 08/03/2020   LDLDIRECT 88.0 08/02/2019   TRIG 197 (H) 08/03/2020   CHOLHDL 3.7 08/03/2020  A/P: patient takes aspirin due to family history of premature MI in father.  Will work on lifestyle and keep same dose as long as LDL around 100- do not want to increase prediabetes risk further- for now continue current rx perhaps unless LDL over 130   #hypothyroidism S: compliant On thyroid medication-compliant On thyroid medication- Synthroid 100Mg daily. Gets smell of smoke sensation if tsh off  -tsh updated 09/21:  was still pending however thyroid was normal. Lab Results  Component Value Date  TSH 3.55 08/03/2020   A/P:hopefully stable- update tsh  today. Continue current meds for now   # GERD- reasonable controlled on  pepcid 20Mg- reflux if misses   #Buzzing sound in ears S: Patient mentioned that over the last year has had some bilateral ear buzzing that comes and goes. Notes hearing worsened over time. No vertigo.  -Discussed possible referral to ENT-he has an ENT provider -so he declined at last visit unless worsening A/P: has hearing aids now- hearing better but still having stbale tinnitus  #Migraines-takes rizatriptan 10 mg as needed typically 2-3 times per week if sinus was really bothering him. If  sinuses are better less than that.    #Allergic rhinitis sees Dr. Nicoletta Ba and Flonase.  Followed with an allergist due to history of anaphylaxis-doing venom immunotherapy now on maintenance with this and on allergy shots.  did need steroid shot several weeks ago and REALLY helped- cleared within hours  # Hyperglycemia/insulin resistance/prediabetes S:  Medication: none A1c 09/21 : 5.7 down slightly from 5.8 Exercise and diet- see above Lab Results  Component Value Date   HGBA1C 5.7 (H) 08/03/2020   HGBA1C 5.8 08/02/2019   HGBA1C 5.8 07/29/2018   A/P: hopefully stable- update a1c  Recommended follow up: No follow-ups on file.  Lab/Order associations: fastingg   ICD-10-CM   1. Preventative health care  Z00.00     2. Hyperglycemia  R73.9     3. Hyperlipidemia, unspecified hyperlipidemia type  E78.5     4. Hypothyroidism, unspecified type  E03.9     5. Migraine without aura and without status migrainosus, not intractable  G43.009     6. Allergic rhinitis, unspecified seasonality, unspecified trigger  J30.9       No orders of the defined types were placed in this encounter.   I,Jada Bradford,acting as a scribe for Garret Reddish, MD.,have documented all relevant documentation on the behalf of Garret Reddish, MD,as directed by  Garret Reddish, MD while in the presence of Garret Reddish, MD.   I, Garret Reddish, MD, have reviewed all documentation for this visit. The documentation on 08/05/21 for the exam, diagnosis, procedures, and orders are all accurate and complete.   Return precautions advised.  Garret Reddish, MD

## 2021-08-05 ENCOUNTER — Other Ambulatory Visit (HOSPITAL_BASED_OUTPATIENT_CLINIC_OR_DEPARTMENT_OTHER): Payer: Self-pay

## 2021-08-05 ENCOUNTER — Encounter: Payer: Self-pay | Admitting: Family Medicine

## 2021-08-05 ENCOUNTER — Other Ambulatory Visit: Payer: Self-pay

## 2021-08-05 ENCOUNTER — Ambulatory Visit (INDEPENDENT_AMBULATORY_CARE_PROVIDER_SITE_OTHER): Payer: 59 | Admitting: Family Medicine

## 2021-08-05 VITALS — BP 118/84 | HR 67 | Temp 98.0°F | Wt 202.4 lb

## 2021-08-05 DIAGNOSIS — G43009 Migraine without aura, not intractable, without status migrainosus: Secondary | ICD-10-CM | POA: Diagnosis not present

## 2021-08-05 DIAGNOSIS — Z0000 Encounter for general adult medical examination without abnormal findings: Secondary | ICD-10-CM | POA: Diagnosis not present

## 2021-08-05 DIAGNOSIS — E039 Hypothyroidism, unspecified: Secondary | ICD-10-CM

## 2021-08-05 DIAGNOSIS — J3089 Other allergic rhinitis: Secondary | ICD-10-CM | POA: Diagnosis not present

## 2021-08-05 DIAGNOSIS — J309 Allergic rhinitis, unspecified: Secondary | ICD-10-CM

## 2021-08-05 DIAGNOSIS — R739 Hyperglycemia, unspecified: Secondary | ICD-10-CM

## 2021-08-05 DIAGNOSIS — E785 Hyperlipidemia, unspecified: Secondary | ICD-10-CM | POA: Diagnosis not present

## 2021-08-05 DIAGNOSIS — J301 Allergic rhinitis due to pollen: Secondary | ICD-10-CM | POA: Diagnosis not present

## 2021-08-05 LAB — CBC WITH DIFFERENTIAL/PLATELET
Basophils Absolute: 0.1 10*3/uL (ref 0.0–0.1)
Basophils Relative: 0.7 % (ref 0.0–3.0)
Eosinophils Absolute: 0.6 10*3/uL (ref 0.0–0.7)
Eosinophils Relative: 6.1 % — ABNORMAL HIGH (ref 0.0–5.0)
HCT: 45.5 % (ref 39.0–52.0)
Hemoglobin: 15.6 g/dL (ref 13.0–17.0)
Lymphocytes Relative: 19.8 % (ref 12.0–46.0)
Lymphs Abs: 1.9 10*3/uL (ref 0.7–4.0)
MCHC: 34.2 g/dL (ref 30.0–36.0)
MCV: 91 fl (ref 78.0–100.0)
Monocytes Absolute: 0.8 10*3/uL (ref 0.1–1.0)
Monocytes Relative: 8.3 % (ref 3.0–12.0)
Neutro Abs: 6.2 10*3/uL (ref 1.4–7.7)
Neutrophils Relative %: 65.1 % (ref 43.0–77.0)
Platelets: 261 10*3/uL (ref 150.0–400.0)
RBC: 5 Mil/uL (ref 4.22–5.81)
RDW: 13 % (ref 11.5–15.5)
WBC: 9.6 10*3/uL (ref 4.0–10.5)

## 2021-08-05 LAB — HEMOGLOBIN A1C: Hgb A1c MFr Bld: 6 % (ref 4.6–6.5)

## 2021-08-05 LAB — COMPREHENSIVE METABOLIC PANEL
ALT: 43 U/L (ref 0–53)
AST: 25 U/L (ref 0–37)
Albumin: 4.4 g/dL (ref 3.5–5.2)
Alkaline Phosphatase: 73 U/L (ref 39–117)
BUN: 14 mg/dL (ref 6–23)
CO2: 29 mEq/L (ref 19–32)
Calcium: 9.4 mg/dL (ref 8.4–10.5)
Chloride: 101 mEq/L (ref 96–112)
Creatinine, Ser: 1.12 mg/dL (ref 0.40–1.50)
GFR: 74.21 mL/min (ref >60.00)
Glucose, Bld: 93 mg/dL (ref 70–99)
Potassium: 4.1 mEq/L (ref 3.5–5.1)
Sodium: 137 mEq/L (ref 135–145)
Total Bilirubin: 0.8 mg/dL (ref 0.2–1.2)
Total Protein: 7.2 g/dL (ref 6.0–8.3)

## 2021-08-05 LAB — LIPID PANEL
Cholesterol: 167 mg/dL (ref 0–200)
HDL: 51 mg/dL (ref >39.00)
LDL Cholesterol: 86 mg/dL (ref 0–99)
NonHDL: 115.5
Total CHOL/HDL Ratio: 3
Triglycerides: 146 mg/dL (ref 0.0–149.0)
VLDL: 29.2 mg/dL (ref 0.0–40.0)

## 2021-08-05 LAB — TSH: TSH: 3.32 u[IU]/mL (ref 0.35–5.50)

## 2021-08-05 MED ORDER — RIZATRIPTAN BENZOATE 10 MG PO TBDP
ORAL_TABLET | ORAL | 5 refills | Status: DC
  Filled 2021-08-05: qty 12, 30d supply, fill #0
  Filled 2021-09-19: qty 12, 30d supply, fill #1
  Filled 2022-02-28: qty 5, 13d supply, fill #2
  Filled 2022-02-28: qty 7, 17d supply, fill #2
  Filled 2022-03-30: qty 12, 30d supply, fill #3
  Filled 2022-04-27: qty 12, 30d supply, fill #4

## 2021-08-05 MED ORDER — LEVOTHYROXINE SODIUM 100 MCG PO TABS
ORAL_TABLET | ORAL | 3 refills | Status: DC
  Filled 2021-08-05 – 2021-09-19 (×2): qty 90, 90d supply, fill #0
  Filled 2021-12-27: qty 90, 90d supply, fill #1
  Filled 2022-03-30: qty 90, 90d supply, fill #2
  Filled 2022-06-27: qty 90, 90d supply, fill #3

## 2021-08-05 NOTE — Patient Instructions (Addendum)
Health Maintenance Due  Topic Date Due   INFLUENZA VACCINE   - regular dose flu shot today.   06/17/2021   Discussed Omicron booster shot at your pharmacy.   Great job on running again and playing golf! Keep up the great work  [employer] you have a great cruise with the family!   Please stop by lab before you go If you have mychart- we will send your results within 3 business days of [place] receiving them.  If you do not have mychart- we will call you about results within 5 business days of [place] receiving them.  *please also note that you will see labs on mychart as soon as they post. I will later go in and write notes on them- will say "notes from Dr. Yong Channel"   Recommended follow up: Return in about 1 year (around 08/05/2022) for follow-up or sooner if needed. [name]

## 2021-08-20 DIAGNOSIS — T63451D Toxic effect of venom of hornets, accidental (unintentional), subsequent encounter: Secondary | ICD-10-CM | POA: Diagnosis not present

## 2021-08-20 DIAGNOSIS — T63461D Toxic effect of venom of wasps, accidental (unintentional), subsequent encounter: Secondary | ICD-10-CM | POA: Diagnosis not present

## 2021-08-26 DIAGNOSIS — J301 Allergic rhinitis due to pollen: Secondary | ICD-10-CM | POA: Diagnosis not present

## 2021-08-26 DIAGNOSIS — J3089 Other allergic rhinitis: Secondary | ICD-10-CM | POA: Diagnosis not present

## 2021-08-29 DIAGNOSIS — T63461D Toxic effect of venom of wasps, accidental (unintentional), subsequent encounter: Secondary | ICD-10-CM | POA: Diagnosis not present

## 2021-08-29 DIAGNOSIS — T63451D Toxic effect of venom of hornets, accidental (unintentional), subsequent encounter: Secondary | ICD-10-CM | POA: Diagnosis not present

## 2021-09-02 DIAGNOSIS — J301 Allergic rhinitis due to pollen: Secondary | ICD-10-CM | POA: Diagnosis not present

## 2021-09-02 DIAGNOSIS — J3089 Other allergic rhinitis: Secondary | ICD-10-CM | POA: Diagnosis not present

## 2021-09-04 DIAGNOSIS — T63451D Toxic effect of venom of hornets, accidental (unintentional), subsequent encounter: Secondary | ICD-10-CM | POA: Diagnosis not present

## 2021-09-04 DIAGNOSIS — T63461D Toxic effect of venom of wasps, accidental (unintentional), subsequent encounter: Secondary | ICD-10-CM | POA: Diagnosis not present

## 2021-09-10 ENCOUNTER — Other Ambulatory Visit (HOSPITAL_COMMUNITY): Payer: Self-pay

## 2021-09-10 DIAGNOSIS — T63441D Toxic effect of venom of bees, accidental (unintentional), subsequent encounter: Secondary | ICD-10-CM | POA: Diagnosis not present

## 2021-09-10 MED ORDER — CARESTART COVID-19 HOME TEST VI KIT
PACK | 0 refills | Status: DC
  Filled 2021-09-10: qty 4, 4d supply, fill #0

## 2021-09-16 DIAGNOSIS — J301 Allergic rhinitis due to pollen: Secondary | ICD-10-CM | POA: Diagnosis not present

## 2021-09-16 DIAGNOSIS — J3089 Other allergic rhinitis: Secondary | ICD-10-CM | POA: Diagnosis not present

## 2021-09-18 DIAGNOSIS — Z91018 Allergy to other foods: Secondary | ICD-10-CM | POA: Diagnosis not present

## 2021-09-18 DIAGNOSIS — J3089 Other allergic rhinitis: Secondary | ICD-10-CM | POA: Diagnosis not present

## 2021-09-18 DIAGNOSIS — J45990 Exercise induced bronchospasm: Secondary | ICD-10-CM | POA: Diagnosis not present

## 2021-09-18 DIAGNOSIS — J301 Allergic rhinitis due to pollen: Secondary | ICD-10-CM | POA: Diagnosis not present

## 2021-09-19 ENCOUNTER — Other Ambulatory Visit (HOSPITAL_BASED_OUTPATIENT_CLINIC_OR_DEPARTMENT_OTHER): Payer: Self-pay

## 2021-09-19 ENCOUNTER — Other Ambulatory Visit: Payer: Self-pay | Admitting: Family Medicine

## 2021-09-19 MED ORDER — TRIAMCINOLONE ACETONIDE 0.1 % EX OINT
TOPICAL_OINTMENT | CUTANEOUS | 3 refills | Status: DC
  Filled 2021-09-19: qty 60, 20d supply, fill #0

## 2021-09-19 MED ORDER — ALBUTEROL SULFATE HFA 108 (90 BASE) MCG/ACT IN AERS
INHALATION_SPRAY | RESPIRATORY_TRACT | 0 refills | Status: DC
  Filled 2021-09-19: qty 18, 17d supply, fill #0

## 2021-09-19 MED ORDER — ATORVASTATIN CALCIUM 20 MG PO TABS
ORAL_TABLET | Freq: Every day | ORAL | 3 refills | Status: DC
  Filled 2021-09-19: qty 90, 90d supply, fill #0
  Filled 2021-12-27: qty 90, 90d supply, fill #1
  Filled 2022-03-30: qty 90, 90d supply, fill #2
  Filled 2022-06-27: qty 90, 90d supply, fill #3

## 2021-09-24 DIAGNOSIS — T63461D Toxic effect of venom of wasps, accidental (unintentional), subsequent encounter: Secondary | ICD-10-CM | POA: Diagnosis not present

## 2021-09-24 DIAGNOSIS — T63451D Toxic effect of venom of hornets, accidental (unintentional), subsequent encounter: Secondary | ICD-10-CM | POA: Diagnosis not present

## 2021-09-25 ENCOUNTER — Ambulatory Visit: Payer: 59 | Attending: Internal Medicine

## 2021-09-25 ENCOUNTER — Other Ambulatory Visit (HOSPITAL_BASED_OUTPATIENT_CLINIC_OR_DEPARTMENT_OTHER): Payer: Self-pay

## 2021-09-25 DIAGNOSIS — Z23 Encounter for immunization: Secondary | ICD-10-CM

## 2021-09-25 MED ORDER — PFIZER COVID-19 VAC BIVALENT 30 MCG/0.3ML IM SUSP
INTRAMUSCULAR | 0 refills | Status: DC
  Filled 2021-09-25: qty 0.3, 1d supply, fill #0

## 2021-09-25 NOTE — Progress Notes (Signed)
   Covid-19 Vaccination Clinic  Name:  JACKSON COFFIELD    MRN: [id number] DOB: 1966/10/06  09/25/2021  Mr. Bradish was observed post Covid-19 immunization for 15 minutes without incident. He was provided with Vaccine Information Sheet and instruction to access the V-Safe system.   Mr. Creech was instructed to call 911 with any severe reactions post vaccine: Difficulty breathing  Swelling of face and throat  A fast heartbeat  A bad rash all over body  Dizziness and weakness   Immunizations Administered     Name Date Dose VIS Date Route   Pfizer Covid-19 Vaccine Bivalent Booster 09/25/2021  1:45 PM 0.3 mL 07/17/2021 Intramuscular   Manufacturer: [place]   Lot: [id number]   [place]: [phone]

## 2021-10-07 DIAGNOSIS — T63441D Toxic effect of venom of bees, accidental (unintentional), subsequent encounter: Secondary | ICD-10-CM | POA: Diagnosis not present

## 2021-10-07 DIAGNOSIS — T63461D Toxic effect of venom of wasps, accidental (unintentional), subsequent encounter: Secondary | ICD-10-CM | POA: Diagnosis not present

## 2021-10-15 DIAGNOSIS — T63451D Toxic effect of venom of hornets, accidental (unintentional), subsequent encounter: Secondary | ICD-10-CM | POA: Diagnosis not present

## 2021-10-15 DIAGNOSIS — T63421D Toxic effect of venom of ants, accidental (unintentional), subsequent encounter: Secondary | ICD-10-CM | POA: Diagnosis not present

## 2021-10-18 DIAGNOSIS — J3089 Other allergic rhinitis: Secondary | ICD-10-CM | POA: Diagnosis not present

## 2021-10-18 DIAGNOSIS — J301 Allergic rhinitis due to pollen: Secondary | ICD-10-CM | POA: Diagnosis not present

## 2021-10-22 DIAGNOSIS — T63461D Toxic effect of venom of wasps, accidental (unintentional), subsequent encounter: Secondary | ICD-10-CM | POA: Diagnosis not present

## 2021-10-22 DIAGNOSIS — T63441D Toxic effect of venom of bees, accidental (unintentional), subsequent encounter: Secondary | ICD-10-CM | POA: Diagnosis not present

## 2021-10-29 DIAGNOSIS — T63461D Toxic effect of venom of wasps, accidental (unintentional), subsequent encounter: Secondary | ICD-10-CM | POA: Diagnosis not present

## 2021-10-29 DIAGNOSIS — T63451D Toxic effect of venom of hornets, accidental (unintentional), subsequent encounter: Secondary | ICD-10-CM | POA: Diagnosis not present

## 2021-11-12 DIAGNOSIS — T63461D Toxic effect of venom of wasps, accidental (unintentional), subsequent encounter: Secondary | ICD-10-CM | POA: Diagnosis not present

## 2021-11-12 DIAGNOSIS — T63451D Toxic effect of venom of hornets, accidental (unintentional), subsequent encounter: Secondary | ICD-10-CM | POA: Diagnosis not present

## 2021-11-26 DIAGNOSIS — J3089 Other allergic rhinitis: Secondary | ICD-10-CM | POA: Diagnosis not present

## 2021-11-26 DIAGNOSIS — J301 Allergic rhinitis due to pollen: Secondary | ICD-10-CM | POA: Diagnosis not present

## 2021-11-27 DIAGNOSIS — C61 Malignant neoplasm of prostate: Secondary | ICD-10-CM | POA: Diagnosis not present

## 2021-11-28 DIAGNOSIS — J301 Allergic rhinitis due to pollen: Secondary | ICD-10-CM | POA: Diagnosis not present

## 2021-11-28 DIAGNOSIS — J3089 Other allergic rhinitis: Secondary | ICD-10-CM | POA: Diagnosis not present

## 2021-11-30 IMAGING — RF DG ESOPHAGUS
9 series · 14 of 24 positions shown · non-contrast
Comparison: None.

CLINICAL DATA: Dysphagia. Food and pills getting stuck in throat.

EXAM:
ESOPHOGRAM / BARIUM SWALLOW / BARIUM TABLET STUDY
TECHNIQUE: Combined double contrast and single contrast examination performed
using effervescent crystals, thick barium liquid, and thin barium
liquid. The patient was observed with fluoroscopy swallowing a 13 mm
barium sulphate tablet.
FLUOROSCOPY TIME:  Fluoroscopy Time:  2 minutes and 12 seconds.
Radiation Exposure Index (if provided by the fluoroscopic device):
108 mGy
Number of Acquired Spot Images: 0

[Series 1: sequence · 0.28mm/px · 2 of 14 frames shown (1 of 8)]
[frame 3/14]
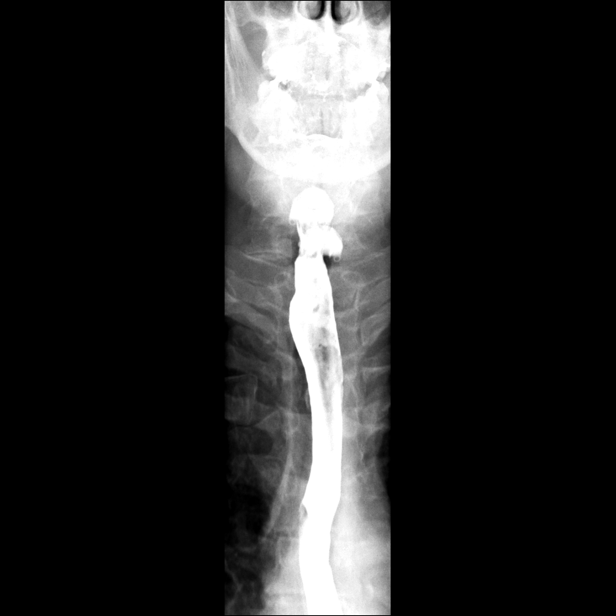
[frame 12/14]
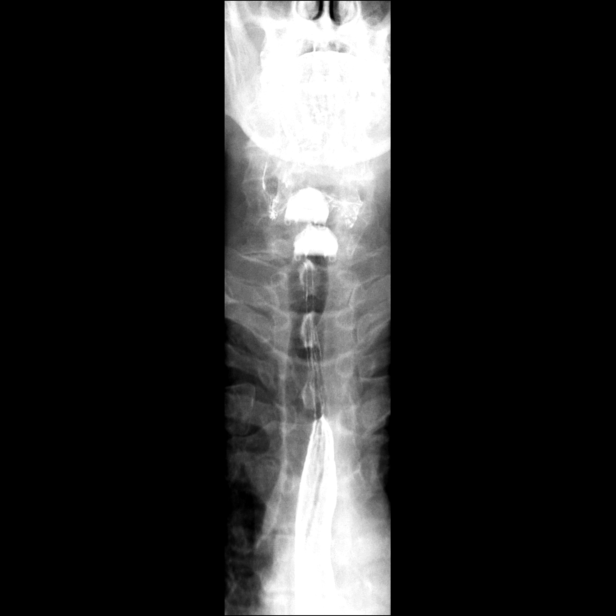

[Series 2: sequence · 0.28mm/px · 1 of 12 frames shown (2 of 8)]
[frame 7/12]
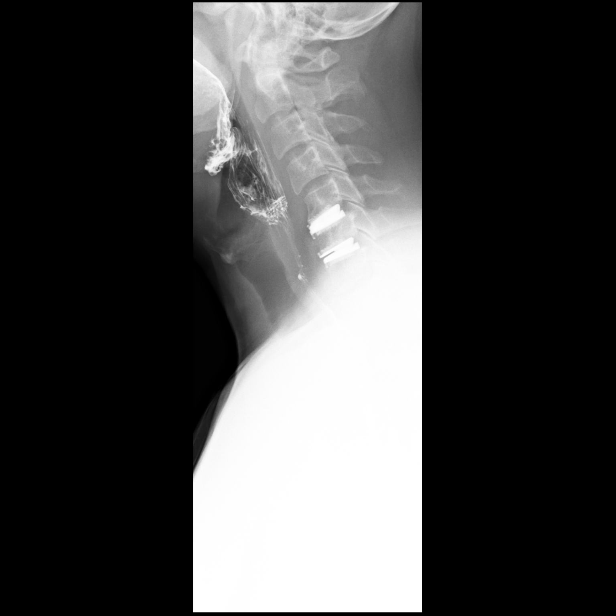

[Series 3: sequence · 0.28mm/px · 2 of 40 frames shown (3 of 8)]
[frame 21/40]
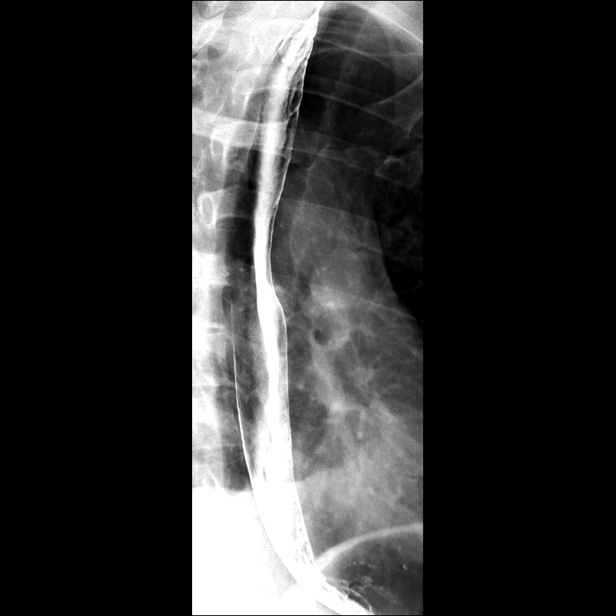
[frame 35/40]
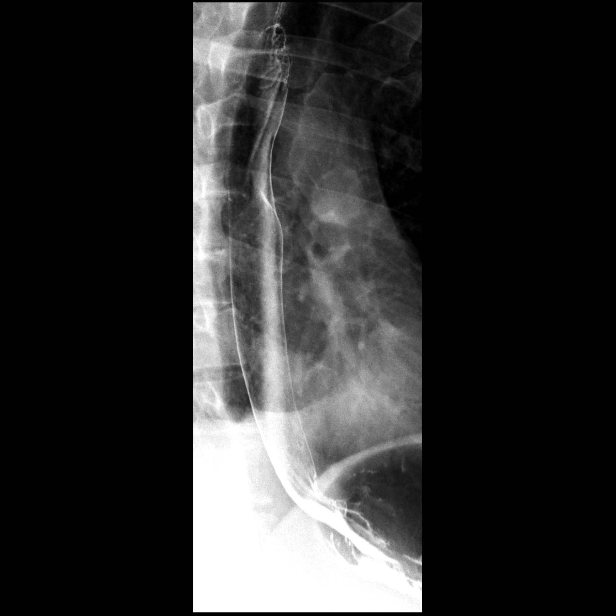

[Series 4: sequence · 1 of 3 frames shown (4 of 8)]
[frame 2/3]
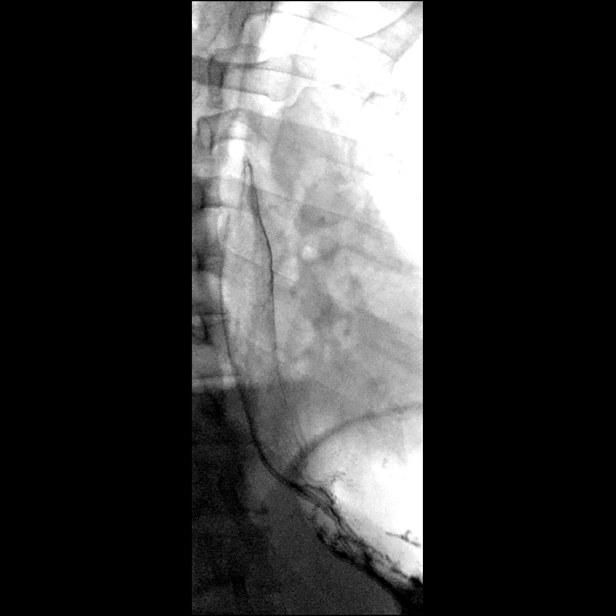

[Series 5: sequence · 2 of 31 frames shown (5 of 8)]
[frame 5/31]
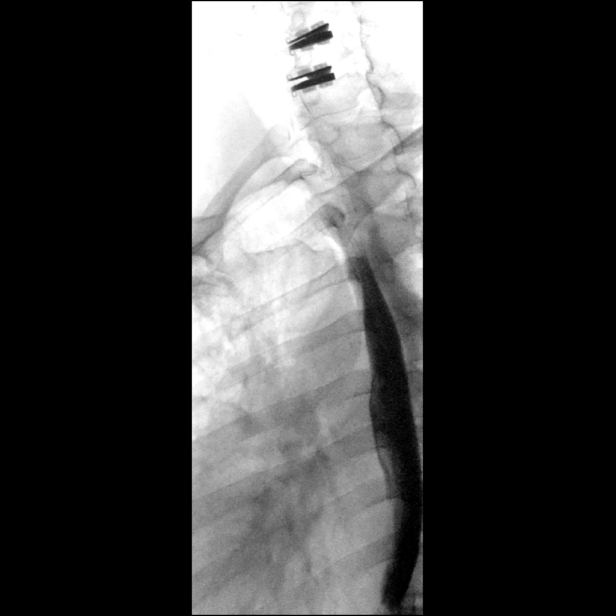
[frame 16/31]
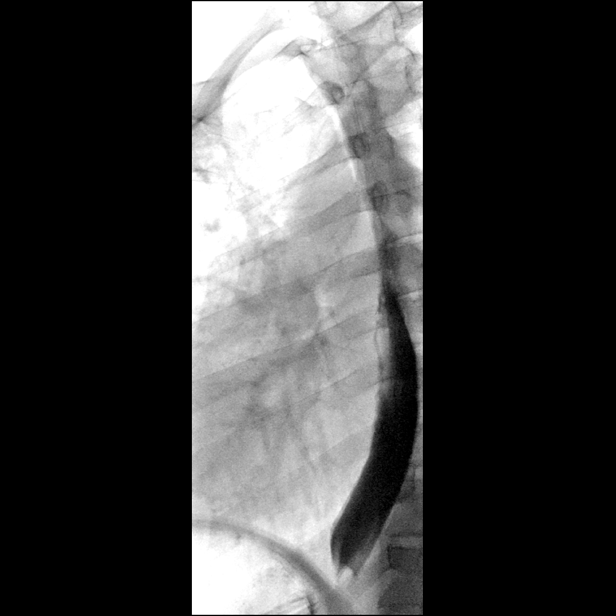

[Series 6: sequence · 2 of 114 frames shown (6 of 8)]
[frame 18/114]
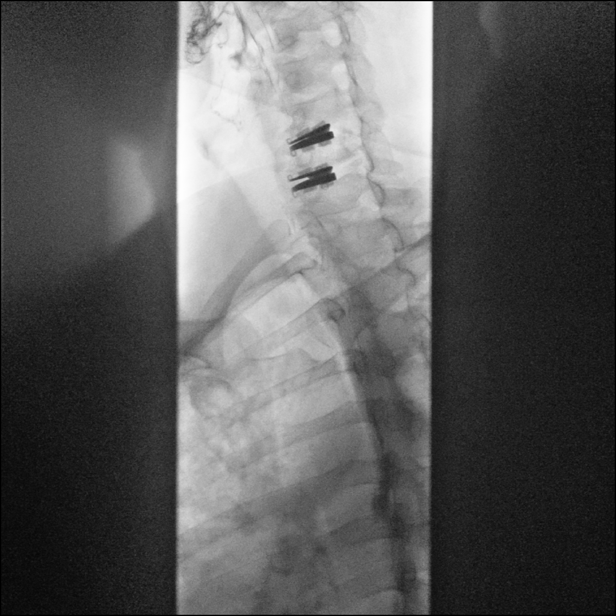
[frame 97/114]
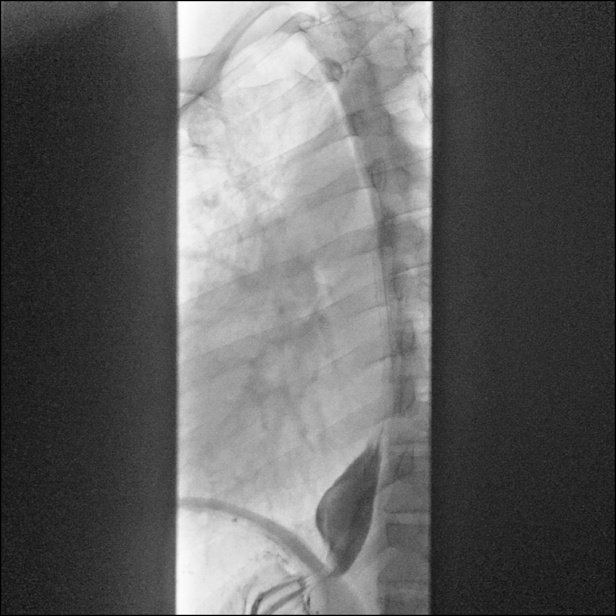

[Series 7: sequence · 2 of 37 frames shown (7 of 8)]
[frame 15/37]
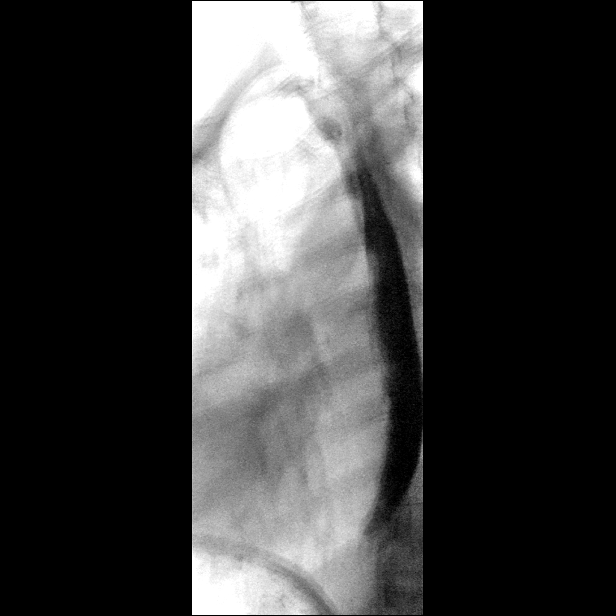
[frame 32/37]
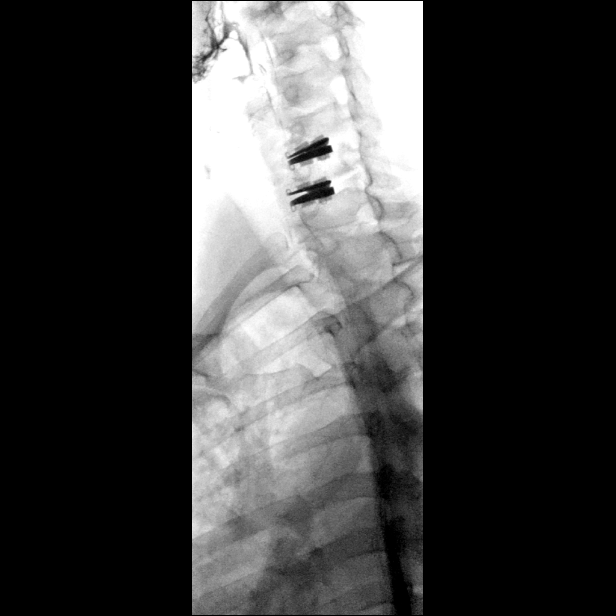

[Series 8: sequence · 1 of 8 frames shown (8 of 8)]
[frame 2/8]
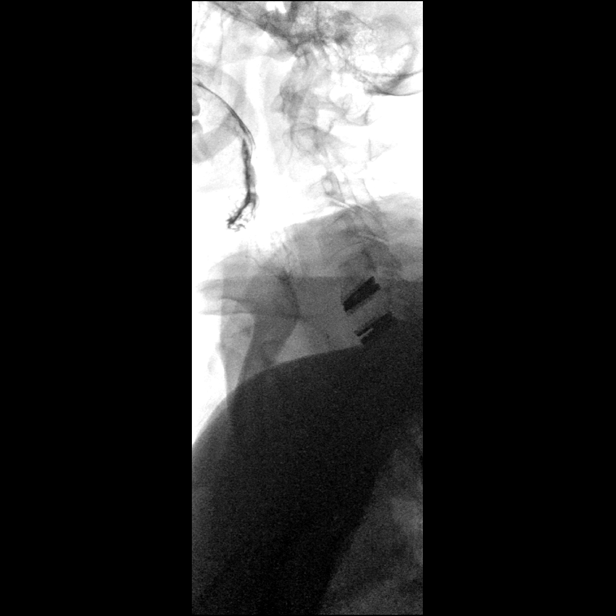

[Series 9: one shot · 1 of 1 slices shown]
[im 1/1]
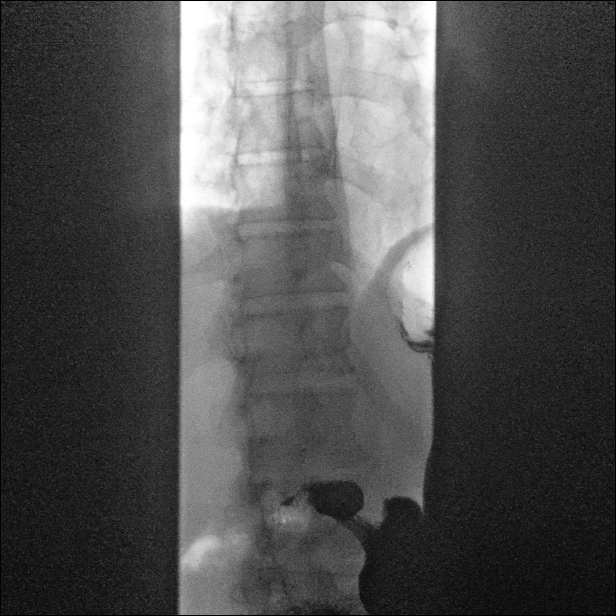

[14 of 24 positions shown; findings below may reference images not displayed]

FINDINGS: Frontal and lateral views of the hypopharynx while swallowing thick
barium show no mass-effect or filling defect. No evidence for
proximal esophageal web. No Dolly diverticulum. No appreciable
mass-effect on the posterior esophagus at the level of surgery (C5-6
and C6-7). Double contrast imaging of the esophagus shows no
evidence for mass lesion, diverticulum, mucosal ulceration, or
stricture.

Assessment of esophageal motility reveals good preservation of
primary peristalsis.

13 mm barium tablet passes readily into the stomach when taken with
water. Patient had no difficulty swallowing the tablet.
IMPRESSION: Unremarkable double contrast barium esophagram.

## 2021-12-03 DIAGNOSIS — T63451D Toxic effect of venom of hornets, accidental (unintentional), subsequent encounter: Secondary | ICD-10-CM | POA: Diagnosis not present

## 2021-12-03 DIAGNOSIS — T63461D Toxic effect of venom of wasps, accidental (unintentional), subsequent encounter: Secondary | ICD-10-CM | POA: Diagnosis not present

## 2021-12-04 DIAGNOSIS — N5231 Erectile dysfunction following radical prostatectomy: Secondary | ICD-10-CM | POA: Diagnosis not present

## 2021-12-04 DIAGNOSIS — C61 Malignant neoplasm of prostate: Secondary | ICD-10-CM | POA: Diagnosis not present

## 2021-12-05 DIAGNOSIS — J3089 Other allergic rhinitis: Secondary | ICD-10-CM | POA: Diagnosis not present

## 2021-12-05 DIAGNOSIS — J301 Allergic rhinitis due to pollen: Secondary | ICD-10-CM | POA: Diagnosis not present

## 2021-12-10 DIAGNOSIS — J3089 Other allergic rhinitis: Secondary | ICD-10-CM | POA: Diagnosis not present

## 2021-12-10 DIAGNOSIS — J301 Allergic rhinitis due to pollen: Secondary | ICD-10-CM | POA: Diagnosis not present

## 2021-12-17 DIAGNOSIS — J301 Allergic rhinitis due to pollen: Secondary | ICD-10-CM | POA: Diagnosis not present

## 2021-12-18 DIAGNOSIS — J3089 Other allergic rhinitis: Secondary | ICD-10-CM | POA: Diagnosis not present

## 2021-12-23 DIAGNOSIS — T63461D Toxic effect of venom of wasps, accidental (unintentional), subsequent encounter: Secondary | ICD-10-CM | POA: Diagnosis not present

## 2021-12-23 DIAGNOSIS — T63451D Toxic effect of venom of hornets, accidental (unintentional), subsequent encounter: Secondary | ICD-10-CM | POA: Diagnosis not present

## 2021-12-27 ENCOUNTER — Other Ambulatory Visit (HOSPITAL_BASED_OUTPATIENT_CLINIC_OR_DEPARTMENT_OTHER): Payer: Self-pay

## 2021-12-31 DIAGNOSIS — J301 Allergic rhinitis due to pollen: Secondary | ICD-10-CM | POA: Diagnosis not present

## 2021-12-31 DIAGNOSIS — J3089 Other allergic rhinitis: Secondary | ICD-10-CM | POA: Diagnosis not present

## 2022-01-16 DIAGNOSIS — J3089 Other allergic rhinitis: Secondary | ICD-10-CM | POA: Diagnosis not present

## 2022-01-16 DIAGNOSIS — J301 Allergic rhinitis due to pollen: Secondary | ICD-10-CM | POA: Diagnosis not present

## 2022-01-16 DIAGNOSIS — J3081 Allergic rhinitis due to animal (cat) (dog) hair and dander: Secondary | ICD-10-CM | POA: Diagnosis not present

## 2022-01-21 DIAGNOSIS — J3089 Other allergic rhinitis: Secondary | ICD-10-CM | POA: Diagnosis not present

## 2022-01-21 DIAGNOSIS — J301 Allergic rhinitis due to pollen: Secondary | ICD-10-CM | POA: Diagnosis not present

## 2022-02-11 DIAGNOSIS — J301 Allergic rhinitis due to pollen: Secondary | ICD-10-CM | POA: Diagnosis not present

## 2022-02-11 DIAGNOSIS — J3089 Other allergic rhinitis: Secondary | ICD-10-CM | POA: Diagnosis not present

## 2022-02-11 DIAGNOSIS — J3081 Allergic rhinitis due to animal (cat) (dog) hair and dander: Secondary | ICD-10-CM | POA: Diagnosis not present

## 2022-02-17 DIAGNOSIS — J301 Allergic rhinitis due to pollen: Secondary | ICD-10-CM | POA: Diagnosis not present

## 2022-02-17 DIAGNOSIS — J3089 Other allergic rhinitis: Secondary | ICD-10-CM | POA: Diagnosis not present

## 2022-02-20 DIAGNOSIS — T63451D Toxic effect of venom of hornets, accidental (unintentional), subsequent encounter: Secondary | ICD-10-CM | POA: Diagnosis not present

## 2022-02-20 DIAGNOSIS — T63441D Toxic effect of venom of bees, accidental (unintentional), subsequent encounter: Secondary | ICD-10-CM | POA: Diagnosis not present

## 2022-02-26 DIAGNOSIS — J301 Allergic rhinitis due to pollen: Secondary | ICD-10-CM | POA: Diagnosis not present

## 2022-02-26 DIAGNOSIS — J3089 Other allergic rhinitis: Secondary | ICD-10-CM | POA: Diagnosis not present

## 2022-02-28 ENCOUNTER — Other Ambulatory Visit (HOSPITAL_BASED_OUTPATIENT_CLINIC_OR_DEPARTMENT_OTHER): Payer: Self-pay

## 2022-03-06 ENCOUNTER — Telehealth (INDEPENDENT_AMBULATORY_CARE_PROVIDER_SITE_OTHER): Payer: 59 | Admitting: Family Medicine

## 2022-03-06 ENCOUNTER — Encounter: Payer: Self-pay | Admitting: Family Medicine

## 2022-03-06 ENCOUNTER — Other Ambulatory Visit (HOSPITAL_BASED_OUTPATIENT_CLINIC_OR_DEPARTMENT_OTHER): Payer: Self-pay

## 2022-03-06 VITALS — Ht 72.0 in | Wt 198.0 lb

## 2022-03-06 DIAGNOSIS — G43009 Migraine without aura, not intractable, without status migrainosus: Secondary | ICD-10-CM

## 2022-03-06 MED ORDER — KETOROLAC TROMETHAMINE 15.75 MG/SPRAY NA SOLN
NASAL | 5 refills | Status: AC
Start: 2022-03-06 — End: ?
  Filled 2022-03-06: qty 1, fill #0

## 2022-03-06 MED ORDER — AMITRIPTYLINE HCL 10 MG PO TABS
10.0000 mg | ORAL_TABLET | Freq: Every day | ORAL | 5 refills | Status: DC
  Filled 2022-03-06: qty 30, 30d supply, fill #0
  Filled 2022-03-30: qty 30, 30d supply, fill #1
  Filled 2022-04-27: qty 30, 30d supply, fill #2
  Filled 2022-05-30: qty 30, 30d supply, fill #3
  Filled 2022-06-27: qty 30, 30d supply, fill #4

## 2022-03-06 NOTE — Progress Notes (Signed)
?Phone [phone] ?Virtual visit via Video note ?  ?Subjective:  ?Chief complaint: ?Chief Complaint  ?Patient presents with  ? Follow-up  ?  Pt would like to f/u on migraines he states they are becoming more frequent (3 a week) on a pain scale of 6/10.  ? ? ?This visit type was conducted due to national recommendations for restrictions regarding the COVID-19 Pandemic (e.g. social distancing).  This format is felt to be most appropriate for this patient at this time balancing risks to patient and risks to population by having him in for in person visit.  No physical exam was performed (except for noted visual exam or audio findings with Telehealth visits).   ? ?Our team/I connected with Aaron Gomez at 11:20 AM EDT by a video enabled telemedicine application (doxy.me or caregility through epic-mild technical difficulties) and verified that I am speaking with the correct person using two identifiers.  ?Location patient: Home-[place] ?Location provider: [facility], office ?Persons participating in the virtual visit:  patient ? ?Our team/I discussed the limitations of evaluation and management by telemedicine and the availability of in person appointments. In light of current covid-19 pandemic, patient also understands that we are trying to protect them by minimizing in office contact if at all possible.  The patient expressed consent for telemedicine visit and agreed to proceed. Patient understands insurance will be billed.  ? ?Past Medical History-  ?Patient Active Problem List  ? Diagnosis Date Noted  ? History of prostate cancer 01/26/2018  ?  Priority: High  ? Hypothyroidism 07/06/2015  ?  Priority: Medium   ? Anaphylaxis 05/23/2011  ?  Priority: Medium   ? Hyperlipidemia 01/12/2009  ?  Priority: Medium   ? Asthma 01/12/2009  ?  Priority: Medium   ? Common migraine 01/12/2009  ?  Priority: Medium   ? Genetic testing 02/26/2018  ?  Priority: Low  ? Family history of breast cancer   ?  Priority: Low  ? Family history  of melanoma   ?  Priority: Low  ? Family history of parotid cancer   ?  Priority: Low  ? Plantar fasciitis of right foot 01/17/2018  ?  Priority: Low  ? Eczema 01/11/2016  ?  Priority: Low  ? Snoring 02/23/2015  ?  Priority: Low  ? TINEA PEDIS 09/25/2009  ?  Priority: Low  ? Allergic rhinitis 01/12/2009  ?  Priority: Low  ? GERD 01/12/2009  ?  Priority: Low  ? NEPHROLITHIASIS, HX OF 01/12/2009  ?  Priority: Low  ? Acute non-recurrent sinusitis 10/11/2018  ? Chronic nonintractable headache 10/19/2017  ? Deviated septum 10/19/2017  ? Nasal turbinate hypertrophy 10/19/2017  ? ? ?Medications- reviewed and updated ?Current Outpatient Medications  ?Medication Sig Dispense Refill  ? albuterol (VENTOLIN HFA) 108 (90 Base) MCG/ACT inhaler Inhale 1-2 puffs every 4-6 hours as needed for cough/wheeze 18 g 0  ? ASPIRIN 81 PO Take 81 mg by mouth daily.     ? atorvastatin (LIPITOR) 20 MG tablet TAKE 1 TABLET (20 MG TOTAL) BY MOUTH DAILY. 90 tablet 3  ? baclofen (LIORESAL) 10 MG tablet Take 1-2 tablets (10-20 mg total) by mouth 3 (three) times daily as needed for muscle spasms. 60 each 3  ? budesonide-formoterol (SYMBICORT) 80-4.5 MCG/ACT inhaler Inhale 2 puffs into the lungs 2 (two) times daily. 10.2 g 5  ? COVID-19 At Home Antigen Test [facility] COVID-19 HOME TEST) KIT Use as directed 4 each 0  ? COVID-19 mRNA bivalent vaccine, Pfizer, (  PFIZER COVID-19 VAC BIVALENT) injection Inject into the muscle. 0.3 mL 0  ? EPINEPHrine 0.3 mg/0.3 mL IJ SOAJ injection Inject 0.3 mg into the muscle once.    ? famotidine (PEPCID) 20 MG tablet Take 20 mg by mouth daily.    ? fexofenadine (ALLEGRA) 180 MG tablet Take 180 mg by mouth daily.     ? levothyroxine (SYNTHROID) 100 MCG tablet TAKE 1 TABLET (100 MCG TOTAL) BY MOUTH DAILY BEFORE BREAKFAST. 90 tablet 3  ? oxyCODONE-acetaminophen (PERCOCET) 10-325 MG tablet Take 1 tablet by mouth every 6 (six) hours as needed for pain (migraine.). 20 tablet 0  ? rizatriptan (MAXALT-MLT) 10 MG disintegrating  tablet DISSOLVE 1 TABLET IN MOUTH ONCE DAILY AS NEEDED FOR MIGRAINE. 12 tablet 5  ? scopolamine (TRANSDERM-SCOP) 1 MG/3DAYS Place 1 patch (1.5 mg total) onto the skin every 3 (three) days. 10 patch 0  ? sildenafil (REVATIO) 20 MG tablet Take 20-100 mg by mouth at bedtime.    ? Spacer/Aero-Holding [facility] DIAMOND) MISC Use as directed 1 each 1  ? tiZANidine (ZANAFLEX) 2 MG tablet Take 1-2 tablets (2-4 mg total) by mouth at bedtime as needed for muscle spasms. 60 tablet 1  ? traMADol (ULTRAM) 50 MG tablet Take 1 tablet (50 mg total) by mouth every 6 (six) hours as needed. 30 tablet 0  ? triamcinolone ointment (KENALOG) 0.1 % apply sparingly to affected areas Externally twice a day prn 90 days 60 g 3  ? fluticasone (FLONASE) 50 MCG/ACT nasal spray Place 1 spray into the nose at bedtime.     ? ?No current facility-administered medications for this visit.  ? ?  ?Objective:  ?Ht 6' (1.829 m)   Wt 198 lb (89.8 kg)   BMI 26.85 kg/m?  self reported vitals ?Gen: NAD ?  ? ?Assessment and Plan  ? ?#Migraines ?S: Patient with known history of migraines.  At last visit in September 2022 patient reported taking 2-3 times a week rizatriptan sinuses were really bothering him but otherwise not taking very frequently. ? ?About 3 months ago maybe 5-6 per month. Over last month- started tracking as saw uptick and noted 3 migraines a week and intensity scaling up- now up to 6-8/10 from prior 3-4/10.  He usually wakes up in the morning with them so hard to get the rizatriptan in early in course. Will also take ibuprofen 87m. Nasal spray Toradol was for kidney stones but helped for migraines.  ? ?No clear triggers-he has journaled rather comprehensively during this time ?A/P: Patient with history of migraines but with poor control-recent worsening in frequency up to 3 times a week as well as intensity-wakes up with them in the morning so hard to treat them aggressively/early. ?- He would like to consider preventative  medication ?-Average resting rate of 51 over last 7 days on his [employer]-therefore I do not believe beta-blocker is ideal choice ?- We discussed trial of amitriptyline 10 mg and can titrate up to 25 mg as long as no significant side effects-she would like to trial this ?-If no improvement within a month discussed referral to neurology and MRI of the brain ?-Has had good success with Sprix in the past but has run out-I think we will fax an order form ? ?Recommended follow up: Keep September visit or sooner if needed ?Future Appointments  ?Date Time Provider DGreybull ?08/11/2022  9:40 AM HYong Channel SBrayton Mars MD [facility]  ? ? ?Lab/Order associations: ?  ICD-10-CM   ?1. Migraine without  aura and without status migrainosus, not intractable  G43.009   ?  ? ? ?Meds ordered this encounter  ?Medications  ? amitriptyline (ELAVIL) 10 MG tablet  ?  Sig: Take 1 tablet (10 mg total) by mouth at bedtime.  ?  Dispense:  30 tablet  ?  Refill:  5  ? Ketorolac Tromethamine 15.75 MG/SPRAY SOLN  ?  Sig: One spray (15.75 mg) in 1 nostril (total dose: 15.75 mg) every 6 to 8 hours; maximum dose: 4 doses (63 mg)/day  ?  Dispense:  1 each  ?  Refill:  5  ? ? ?Return precautions advised.  ?Garret Reddish, MD ? ? ?

## 2022-03-13 DIAGNOSIS — H524 Presbyopia: Secondary | ICD-10-CM | POA: Diagnosis not present

## 2022-03-18 DIAGNOSIS — T63441D Toxic effect of venom of bees, accidental (unintentional), subsequent encounter: Secondary | ICD-10-CM | POA: Diagnosis not present

## 2022-03-18 DIAGNOSIS — T63461D Toxic effect of venom of wasps, accidental (unintentional), subsequent encounter: Secondary | ICD-10-CM | POA: Diagnosis not present

## 2022-03-18 DIAGNOSIS — T63451D Toxic effect of venom of hornets, accidental (unintentional), subsequent encounter: Secondary | ICD-10-CM | POA: Diagnosis not present

## 2022-03-20 DIAGNOSIS — J3089 Other allergic rhinitis: Secondary | ICD-10-CM | POA: Diagnosis not present

## 2022-03-20 DIAGNOSIS — J301 Allergic rhinitis due to pollen: Secondary | ICD-10-CM | POA: Diagnosis not present

## 2022-03-20 DIAGNOSIS — J3081 Allergic rhinitis due to animal (cat) (dog) hair and dander: Secondary | ICD-10-CM | POA: Diagnosis not present

## 2022-03-21 DIAGNOSIS — M722 Plantar fascial fibromatosis: Secondary | ICD-10-CM | POA: Diagnosis not present

## 2022-03-21 DIAGNOSIS — M79672 Pain in left foot: Secondary | ICD-10-CM | POA: Diagnosis not present

## 2022-03-31 ENCOUNTER — Other Ambulatory Visit (HOSPITAL_BASED_OUTPATIENT_CLINIC_OR_DEPARTMENT_OTHER): Payer: Self-pay

## 2022-04-01 DIAGNOSIS — J3089 Other allergic rhinitis: Secondary | ICD-10-CM | POA: Diagnosis not present

## 2022-04-01 DIAGNOSIS — J301 Allergic rhinitis due to pollen: Secondary | ICD-10-CM | POA: Diagnosis not present

## 2022-04-17 ENCOUNTER — Other Ambulatory Visit (HOSPITAL_BASED_OUTPATIENT_CLINIC_OR_DEPARTMENT_OTHER): Payer: Self-pay

## 2022-04-17 DIAGNOSIS — J45990 Exercise induced bronchospasm: Secondary | ICD-10-CM | POA: Diagnosis not present

## 2022-04-17 DIAGNOSIS — J3089 Other allergic rhinitis: Secondary | ICD-10-CM | POA: Diagnosis not present

## 2022-04-17 DIAGNOSIS — J301 Allergic rhinitis due to pollen: Secondary | ICD-10-CM | POA: Diagnosis not present

## 2022-04-17 DIAGNOSIS — Z91018 Allergy to other foods: Secondary | ICD-10-CM | POA: Diagnosis not present

## 2022-04-17 MED ORDER — ALBUTEROL SULFATE HFA 108 (90 BASE) MCG/ACT IN AERS
INHALATION_SPRAY | RESPIRATORY_TRACT | 0 refills | Status: DC
  Filled 2022-04-17: qty 6.7, 17d supply, fill #0

## 2022-04-17 MED ORDER — BUDESONIDE-FORMOTEROL FUMARATE 80-4.5 MCG/ACT IN AERO
INHALATION_SPRAY | RESPIRATORY_TRACT | 5 refills | Status: DC
  Filled 2022-04-17: qty 10.2, 30d supply, fill #0

## 2022-04-21 DIAGNOSIS — T63461D Toxic effect of venom of wasps, accidental (unintentional), subsequent encounter: Secondary | ICD-10-CM | POA: Diagnosis not present

## 2022-04-21 DIAGNOSIS — T63451D Toxic effect of venom of hornets, accidental (unintentional), subsequent encounter: Secondary | ICD-10-CM | POA: Diagnosis not present

## 2022-04-23 DIAGNOSIS — J3089 Other allergic rhinitis: Secondary | ICD-10-CM | POA: Diagnosis not present

## 2022-04-23 DIAGNOSIS — J301 Allergic rhinitis due to pollen: Secondary | ICD-10-CM | POA: Diagnosis not present

## 2022-04-23 DIAGNOSIS — J3081 Allergic rhinitis due to animal (cat) (dog) hair and dander: Secondary | ICD-10-CM | POA: Diagnosis not present

## 2022-04-28 ENCOUNTER — Other Ambulatory Visit (HOSPITAL_BASED_OUTPATIENT_CLINIC_OR_DEPARTMENT_OTHER): Payer: Self-pay

## 2022-05-21 DIAGNOSIS — J301 Allergic rhinitis due to pollen: Secondary | ICD-10-CM | POA: Diagnosis not present

## 2022-05-21 DIAGNOSIS — J3089 Other allergic rhinitis: Secondary | ICD-10-CM | POA: Diagnosis not present

## 2022-05-23 DIAGNOSIS — T63421D Toxic effect of venom of ants, accidental (unintentional), subsequent encounter: Secondary | ICD-10-CM | POA: Diagnosis not present

## 2022-05-23 DIAGNOSIS — T63451D Toxic effect of venom of hornets, accidental (unintentional), subsequent encounter: Secondary | ICD-10-CM | POA: Diagnosis not present

## 2022-05-23 DIAGNOSIS — T63441D Toxic effect of venom of bees, accidental (unintentional), subsequent encounter: Secondary | ICD-10-CM | POA: Diagnosis not present

## 2022-05-30 ENCOUNTER — Other Ambulatory Visit (HOSPITAL_BASED_OUTPATIENT_CLINIC_OR_DEPARTMENT_OTHER): Payer: Self-pay

## 2022-06-10 DIAGNOSIS — J3089 Other allergic rhinitis: Secondary | ICD-10-CM | POA: Diagnosis not present

## 2022-06-10 DIAGNOSIS — J3081 Allergic rhinitis due to animal (cat) (dog) hair and dander: Secondary | ICD-10-CM | POA: Diagnosis not present

## 2022-06-10 DIAGNOSIS — J301 Allergic rhinitis due to pollen: Secondary | ICD-10-CM | POA: Diagnosis not present

## 2022-06-17 DIAGNOSIS — M170 Bilateral primary osteoarthritis of knee: Secondary | ICD-10-CM | POA: Diagnosis not present

## 2022-06-17 DIAGNOSIS — M25561 Pain in right knee: Secondary | ICD-10-CM | POA: Diagnosis not present

## 2022-06-20 DIAGNOSIS — T63441D Toxic effect of venom of bees, accidental (unintentional), subsequent encounter: Secondary | ICD-10-CM | POA: Diagnosis not present

## 2022-06-20 DIAGNOSIS — T63461D Toxic effect of venom of wasps, accidental (unintentional), subsequent encounter: Secondary | ICD-10-CM | POA: Diagnosis not present

## 2022-06-20 DIAGNOSIS — T63451D Toxic effect of venom of hornets, accidental (unintentional), subsequent encounter: Secondary | ICD-10-CM | POA: Diagnosis not present

## 2022-06-27 ENCOUNTER — Other Ambulatory Visit (HOSPITAL_BASED_OUTPATIENT_CLINIC_OR_DEPARTMENT_OTHER): Payer: Self-pay

## 2022-07-01 ENCOUNTER — Other Ambulatory Visit (HOSPITAL_COMMUNITY): Payer: Self-pay

## 2022-07-01 ENCOUNTER — Other Ambulatory Visit: Payer: Self-pay

## 2022-07-01 DIAGNOSIS — J3089 Other allergic rhinitis: Secondary | ICD-10-CM | POA: Diagnosis not present

## 2022-07-01 DIAGNOSIS — J301 Allergic rhinitis due to pollen: Secondary | ICD-10-CM | POA: Diagnosis not present

## 2022-07-01 DIAGNOSIS — J3081 Allergic rhinitis due to animal (cat) (dog) hair and dander: Secondary | ICD-10-CM | POA: Diagnosis not present

## 2022-07-01 MED ORDER — AMITRIPTYLINE HCL 10 MG PO TABS
10.0000 mg | ORAL_TABLET | Freq: Every day | ORAL | 3 refills | Status: DC
  Filled 2022-07-01 – 2022-08-07 (×3): qty 90, 90d supply, fill #0
  Filled 2022-11-07: qty 90, 90d supply, fill #1
  Filled 2023-03-09: qty 90, 90d supply, fill #2

## 2022-07-08 DIAGNOSIS — C61 Malignant neoplasm of prostate: Secondary | ICD-10-CM | POA: Diagnosis not present

## 2022-07-12 DIAGNOSIS — M25461 Effusion, right knee: Secondary | ICD-10-CM | POA: Diagnosis not present

## 2022-07-15 DIAGNOSIS — C61 Malignant neoplasm of prostate: Secondary | ICD-10-CM | POA: Diagnosis not present

## 2022-07-22 ENCOUNTER — Other Ambulatory Visit (HOSPITAL_BASED_OUTPATIENT_CLINIC_OR_DEPARTMENT_OTHER): Payer: Self-pay

## 2022-07-22 DIAGNOSIS — J3081 Allergic rhinitis due to animal (cat) (dog) hair and dander: Secondary | ICD-10-CM | POA: Diagnosis not present

## 2022-07-22 DIAGNOSIS — J3089 Other allergic rhinitis: Secondary | ICD-10-CM | POA: Diagnosis not present

## 2022-07-22 DIAGNOSIS — J301 Allergic rhinitis due to pollen: Secondary | ICD-10-CM | POA: Diagnosis not present

## 2022-07-22 DIAGNOSIS — M94261 Chondromalacia, right knee: Secondary | ICD-10-CM | POA: Diagnosis not present

## 2022-07-22 MED ORDER — CELECOXIB 200 MG PO CAPS
ORAL_CAPSULE | ORAL | 1 refills | Status: DC
  Filled 2022-07-22: qty 30, 15d supply, fill #0
  Filled 2022-08-06: qty 30, 15d supply, fill #1
  Filled 2022-08-07 (×2): qty 30, 15d supply, fill #0

## 2022-07-24 DIAGNOSIS — T63461D Toxic effect of venom of wasps, accidental (unintentional), subsequent encounter: Secondary | ICD-10-CM | POA: Diagnosis not present

## 2022-07-24 DIAGNOSIS — T63451D Toxic effect of venom of hornets, accidental (unintentional), subsequent encounter: Secondary | ICD-10-CM | POA: Diagnosis not present

## 2022-07-24 DIAGNOSIS — T63441D Toxic effect of venom of bees, accidental (unintentional), subsequent encounter: Secondary | ICD-10-CM | POA: Diagnosis not present

## 2022-08-07 ENCOUNTER — Other Ambulatory Visit (HOSPITAL_COMMUNITY): Payer: Self-pay

## 2022-08-07 ENCOUNTER — Other Ambulatory Visit (HOSPITAL_BASED_OUTPATIENT_CLINIC_OR_DEPARTMENT_OTHER): Payer: Self-pay

## 2022-08-07 DIAGNOSIS — M94261 Chondromalacia, right knee: Secondary | ICD-10-CM | POA: Diagnosis not present

## 2022-08-07 DIAGNOSIS — M2560 Stiffness of unspecified joint, not elsewhere classified: Secondary | ICD-10-CM | POA: Diagnosis not present

## 2022-08-07 DIAGNOSIS — M6281 Muscle weakness (generalized): Secondary | ICD-10-CM | POA: Diagnosis not present

## 2022-08-11 ENCOUNTER — Encounter: Payer: 59 | Admitting: Family Medicine

## 2022-08-11 ENCOUNTER — Encounter: Payer: Self-pay | Admitting: *Deleted

## 2022-08-11 DIAGNOSIS — M94261 Chondromalacia, right knee: Secondary | ICD-10-CM | POA: Diagnosis not present

## 2022-08-11 DIAGNOSIS — M2560 Stiffness of unspecified joint, not elsewhere classified: Secondary | ICD-10-CM | POA: Diagnosis not present

## 2022-08-11 DIAGNOSIS — M6281 Muscle weakness (generalized): Secondary | ICD-10-CM | POA: Diagnosis not present

## 2022-08-12 DIAGNOSIS — J3081 Allergic rhinitis due to animal (cat) (dog) hair and dander: Secondary | ICD-10-CM | POA: Diagnosis not present

## 2022-08-12 DIAGNOSIS — J301 Allergic rhinitis due to pollen: Secondary | ICD-10-CM | POA: Diagnosis not present

## 2022-08-12 DIAGNOSIS — J3089 Other allergic rhinitis: Secondary | ICD-10-CM | POA: Diagnosis not present

## 2022-08-13 DIAGNOSIS — M94261 Chondromalacia, right knee: Secondary | ICD-10-CM | POA: Diagnosis not present

## 2022-08-19 DIAGNOSIS — M2560 Stiffness of unspecified joint, not elsewhere classified: Secondary | ICD-10-CM | POA: Diagnosis not present

## 2022-08-19 DIAGNOSIS — M94261 Chondromalacia, right knee: Secondary | ICD-10-CM | POA: Diagnosis not present

## 2022-08-19 DIAGNOSIS — M6281 Muscle weakness (generalized): Secondary | ICD-10-CM | POA: Diagnosis not present

## 2022-08-27 DIAGNOSIS — M94261 Chondromalacia, right knee: Secondary | ICD-10-CM | POA: Diagnosis not present

## 2022-08-27 DIAGNOSIS — M6281 Muscle weakness (generalized): Secondary | ICD-10-CM | POA: Diagnosis not present

## 2022-08-27 DIAGNOSIS — M2560 Stiffness of unspecified joint, not elsewhere classified: Secondary | ICD-10-CM | POA: Diagnosis not present

## 2022-09-03 DIAGNOSIS — J3089 Other allergic rhinitis: Secondary | ICD-10-CM | POA: Diagnosis not present

## 2022-09-03 DIAGNOSIS — J301 Allergic rhinitis due to pollen: Secondary | ICD-10-CM | POA: Diagnosis not present

## 2022-09-08 DIAGNOSIS — M94261 Chondromalacia, right knee: Secondary | ICD-10-CM | POA: Diagnosis not present

## 2022-09-08 DIAGNOSIS — M2560 Stiffness of unspecified joint, not elsewhere classified: Secondary | ICD-10-CM | POA: Diagnosis not present

## 2022-09-08 DIAGNOSIS — M6281 Muscle weakness (generalized): Secondary | ICD-10-CM | POA: Diagnosis not present

## 2022-09-16 DIAGNOSIS — M94261 Chondromalacia, right knee: Secondary | ICD-10-CM | POA: Diagnosis not present

## 2022-09-16 DIAGNOSIS — M6281 Muscle weakness (generalized): Secondary | ICD-10-CM | POA: Diagnosis not present

## 2022-09-16 DIAGNOSIS — M2560 Stiffness of unspecified joint, not elsewhere classified: Secondary | ICD-10-CM | POA: Diagnosis not present

## 2022-09-18 ENCOUNTER — Other Ambulatory Visit (HOSPITAL_BASED_OUTPATIENT_CLINIC_OR_DEPARTMENT_OTHER): Payer: Self-pay

## 2022-09-18 MED ORDER — INFLUENZA VAC SPLIT QUAD 0.5 ML IM SUSY
PREFILLED_SYRINGE | INTRAMUSCULAR | 0 refills | Status: DC
  Filled 2022-09-18: qty 0.5, 1d supply, fill #0

## 2022-09-22 DIAGNOSIS — T63441D Toxic effect of venom of bees, accidental (unintentional), subsequent encounter: Secondary | ICD-10-CM | POA: Diagnosis not present

## 2022-09-22 DIAGNOSIS — T63451D Toxic effect of venom of hornets, accidental (unintentional), subsequent encounter: Secondary | ICD-10-CM | POA: Diagnosis not present

## 2022-09-22 DIAGNOSIS — T63461D Toxic effect of venom of wasps, accidental (unintentional), subsequent encounter: Secondary | ICD-10-CM | POA: Diagnosis not present

## 2022-09-24 DIAGNOSIS — J3081 Allergic rhinitis due to animal (cat) (dog) hair and dander: Secondary | ICD-10-CM | POA: Diagnosis not present

## 2022-09-24 DIAGNOSIS — J3089 Other allergic rhinitis: Secondary | ICD-10-CM | POA: Diagnosis not present

## 2022-09-24 DIAGNOSIS — J301 Allergic rhinitis due to pollen: Secondary | ICD-10-CM | POA: Diagnosis not present

## 2022-10-06 ENCOUNTER — Other Ambulatory Visit (HOSPITAL_COMMUNITY): Payer: Self-pay

## 2022-10-06 ENCOUNTER — Other Ambulatory Visit (HOSPITAL_BASED_OUTPATIENT_CLINIC_OR_DEPARTMENT_OTHER): Payer: Self-pay

## 2022-10-06 ENCOUNTER — Other Ambulatory Visit: Payer: Self-pay | Admitting: Family Medicine

## 2022-10-06 MED ORDER — LEVOTHYROXINE SODIUM 100 MCG PO TABS
100.0000 ug | ORAL_TABLET | Freq: Every day | ORAL | 3 refills | Status: DC
  Filled 2022-10-06 – 2022-10-08 (×2): qty 90, 90d supply, fill #0
  Filled 2023-01-04: qty 90, 90d supply, fill #1
  Filled 2023-04-01: qty 90, 90d supply, fill #2
  Filled 2023-07-02: qty 90, 90d supply, fill #3

## 2022-10-06 MED ORDER — CELECOXIB 200 MG PO CAPS
ORAL_CAPSULE | ORAL | 1 refills | Status: DC
  Filled 2022-10-06: qty 30, 15d supply, fill #0
  Filled 2022-11-07: qty 30, 15d supply, fill #1

## 2022-10-06 MED ORDER — ATORVASTATIN CALCIUM 20 MG PO TABS
20.0000 mg | ORAL_TABLET | Freq: Every day | ORAL | 3 refills | Status: DC
  Filled 2022-10-06 – 2022-10-08 (×2): qty 90, 90d supply, fill #0
  Filled 2023-01-04: qty 90, 90d supply, fill #1
  Filled 2023-04-01: qty 90, 90d supply, fill #2
  Filled 2023-07-02: qty 90, 90d supply, fill #3

## 2022-10-08 ENCOUNTER — Other Ambulatory Visit (HOSPITAL_BASED_OUTPATIENT_CLINIC_OR_DEPARTMENT_OTHER): Payer: Self-pay

## 2022-10-08 ENCOUNTER — Other Ambulatory Visit (HOSPITAL_COMMUNITY): Payer: Self-pay

## 2022-10-14 DIAGNOSIS — J301 Allergic rhinitis due to pollen: Secondary | ICD-10-CM | POA: Diagnosis not present

## 2022-10-14 DIAGNOSIS — J3089 Other allergic rhinitis: Secondary | ICD-10-CM | POA: Diagnosis not present

## 2022-10-23 DIAGNOSIS — T63451D Toxic effect of venom of hornets, accidental (unintentional), subsequent encounter: Secondary | ICD-10-CM | POA: Diagnosis not present

## 2022-10-23 DIAGNOSIS — T63461D Toxic effect of venom of wasps, accidental (unintentional), subsequent encounter: Secondary | ICD-10-CM | POA: Diagnosis not present

## 2022-10-23 DIAGNOSIS — T63441D Toxic effect of venom of bees, accidental (unintentional), subsequent encounter: Secondary | ICD-10-CM | POA: Diagnosis not present

## 2022-11-04 DIAGNOSIS — J3089 Other allergic rhinitis: Secondary | ICD-10-CM | POA: Diagnosis not present

## 2022-11-04 DIAGNOSIS — J301 Allergic rhinitis due to pollen: Secondary | ICD-10-CM | POA: Diagnosis not present

## 2022-11-07 ENCOUNTER — Other Ambulatory Visit: Payer: Self-pay | Admitting: Family Medicine

## 2022-11-07 ENCOUNTER — Other Ambulatory Visit: Payer: Self-pay

## 2022-11-11 ENCOUNTER — Other Ambulatory Visit (HOSPITAL_BASED_OUTPATIENT_CLINIC_OR_DEPARTMENT_OTHER): Payer: Self-pay

## 2022-11-11 MED ORDER — RIZATRIPTAN BENZOATE 10 MG PO TBDP
10.0000 mg | ORAL_TABLET | Freq: Every day | ORAL | 5 refills | Status: DC | PRN
  Filled 2022-11-11: qty 12, 20d supply, fill #0
  Filled 2023-03-09: qty 12, 20d supply, fill #1
  Filled 2023-07-02: qty 12, 20d supply, fill #2

## 2022-11-19 DIAGNOSIS — T63441D Toxic effect of venom of bees, accidental (unintentional), subsequent encounter: Secondary | ICD-10-CM | POA: Diagnosis not present

## 2022-11-19 DIAGNOSIS — T63461D Toxic effect of venom of wasps, accidental (unintentional), subsequent encounter: Secondary | ICD-10-CM | POA: Diagnosis not present

## 2022-11-19 DIAGNOSIS — T63451D Toxic effect of venom of hornets, accidental (unintentional), subsequent encounter: Secondary | ICD-10-CM | POA: Diagnosis not present

## 2022-11-27 DIAGNOSIS — J3089 Other allergic rhinitis: Secondary | ICD-10-CM | POA: Diagnosis not present

## 2022-11-27 DIAGNOSIS — J3081 Allergic rhinitis due to animal (cat) (dog) hair and dander: Secondary | ICD-10-CM | POA: Diagnosis not present

## 2022-11-27 DIAGNOSIS — J301 Allergic rhinitis due to pollen: Secondary | ICD-10-CM | POA: Diagnosis not present

## 2022-12-19 DIAGNOSIS — J3089 Other allergic rhinitis: Secondary | ICD-10-CM | POA: Diagnosis not present

## 2022-12-19 DIAGNOSIS — J301 Allergic rhinitis due to pollen: Secondary | ICD-10-CM | POA: Diagnosis not present

## 2022-12-22 DIAGNOSIS — T63441D Toxic effect of venom of bees, accidental (unintentional), subsequent encounter: Secondary | ICD-10-CM | POA: Diagnosis not present

## 2022-12-22 DIAGNOSIS — T63461D Toxic effect of venom of wasps, accidental (unintentional), subsequent encounter: Secondary | ICD-10-CM | POA: Diagnosis not present

## 2022-12-22 DIAGNOSIS — T63451D Toxic effect of venom of hornets, accidental (unintentional), subsequent encounter: Secondary | ICD-10-CM | POA: Diagnosis not present

## 2022-12-23 ENCOUNTER — Ambulatory Visit: Payer: Commercial Managed Care - PPO | Admitting: Family Medicine

## 2022-12-23 ENCOUNTER — Encounter: Payer: Self-pay | Admitting: Family Medicine

## 2022-12-23 VITALS — BP 136/88 | HR 58 | Temp 97.0°F | Ht 72.0 in | Wt 204.2 lb

## 2022-12-23 DIAGNOSIS — Z0000 Encounter for general adult medical examination without abnormal findings: Secondary | ICD-10-CM | POA: Diagnosis not present

## 2022-12-23 DIAGNOSIS — E039 Hypothyroidism, unspecified: Secondary | ICD-10-CM

## 2022-12-23 DIAGNOSIS — E785 Hyperlipidemia, unspecified: Secondary | ICD-10-CM

## 2022-12-23 DIAGNOSIS — Z8546 Personal history of malignant neoplasm of prostate: Secondary | ICD-10-CM | POA: Diagnosis not present

## 2022-12-23 DIAGNOSIS — Z23 Encounter for immunization: Secondary | ICD-10-CM

## 2022-12-23 DIAGNOSIS — J301 Allergic rhinitis due to pollen: Secondary | ICD-10-CM | POA: Diagnosis not present

## 2022-12-23 DIAGNOSIS — J3089 Other allergic rhinitis: Secondary | ICD-10-CM | POA: Diagnosis not present

## 2022-12-23 DIAGNOSIS — R739 Hyperglycemia, unspecified: Secondary | ICD-10-CM | POA: Diagnosis not present

## 2022-12-23 LAB — CBC WITH DIFFERENTIAL/PLATELET
Basophils Absolute: 0.1 10*3/uL (ref 0.0–0.1)
Basophils Relative: 0.8 % (ref 0.0–3.0)
Eosinophils Absolute: 0.2 10*3/uL (ref 0.0–0.7)
Eosinophils Relative: 2.5 % (ref 0.0–5.0)
HCT: 47.2 % (ref 39.0–52.0)
Hemoglobin: 16.1 g/dL (ref 13.0–17.0)
Lymphocytes Relative: 28.6 % (ref 12.0–46.0)
Lymphs Abs: 2.3 10*3/uL (ref 0.7–4.0)
MCHC: 34.1 g/dL (ref 30.0–36.0)
MCV: 91.6 fl (ref 78.0–100.0)
Monocytes Absolute: 0.8 10*3/uL (ref 0.1–1.0)
Monocytes Relative: 9.7 % (ref 3.0–12.0)
Neutro Abs: 4.6 10*3/uL (ref 1.4–7.7)
Neutrophils Relative %: 58.4 % (ref 43.0–77.0)
Platelets: 319 10*3/uL (ref 150.0–400.0)
RBC: 5.15 Mil/uL (ref 4.22–5.81)
RDW: 12.9 % (ref 11.5–15.5)
WBC: 7.9 10*3/uL (ref 4.0–10.5)

## 2022-12-23 LAB — COMPREHENSIVE METABOLIC PANEL
ALT: 76 U/L — ABNORMAL HIGH (ref 0–53)
AST: 34 U/L (ref 0–37)
Albumin: 4.9 g/dL (ref 3.5–5.2)
Alkaline Phosphatase: 74 U/L (ref 39–117)
BUN: 13 mg/dL (ref 6–23)
CO2: 27 mEq/L (ref 19–32)
Calcium: 9.9 mg/dL (ref 8.4–10.5)
Chloride: 101 mEq/L (ref 96–112)
Creatinine, Ser: 1.01 mg/dL (ref 0.40–1.50)
GFR: 83.2 mL/min (ref >60.00)
Glucose, Bld: 95 mg/dL (ref 70–99)
Potassium: 4.6 mEq/L (ref 3.5–5.1)
Sodium: 140 mEq/L (ref 135–145)
Total Bilirubin: 0.8 mg/dL (ref 0.2–1.2)
Total Protein: 7.1 g/dL (ref 6.0–8.3)

## 2022-12-23 LAB — LIPID PANEL
Cholesterol: 176 mg/dL (ref 0–200)
HDL: 45.5 mg/dL (ref >39.00)
NonHDL: 130.82
Total CHOL/HDL Ratio: 4
Triglycerides: 226 mg/dL — ABNORMAL HIGH (ref 0.0–149.0)
VLDL: 45.2 mg/dL — ABNORMAL HIGH (ref 0.0–40.0)

## 2022-12-23 LAB — LDL CHOLESTEROL, DIRECT: Direct LDL: 90 mg/dL

## 2022-12-23 LAB — HEMOGLOBIN A1C: Hgb A1c MFr Bld: 5.7 % (ref 4.6–6.5)

## 2022-12-23 LAB — TSH: TSH: 3.24 u[IU]/mL (ref 0.35–5.50)

## 2022-12-23 NOTE — Addendum Note (Signed)
Addended by: Bing Neighbors on: 12/23/2022 01:52 PM   Modules accepted: Orders

## 2022-12-23 NOTE — Patient Instructions (Addendum)
Tdap today  We will call you within two weeks about your referral to ct calcium scoring through [facility].  Their phone number is [phone].  Please call them if you have not heard in 1-2 weeks  Lets monitor blood pressure at home in lower stress environment- goal <135/85 on home readings. Update me in 2 weeks with at least 5 readings. Either wife can check or omron series 3 is reasonable (usually around $35)  Please stop by lab before you go If you have mychart- we will send your results within 3 business days of [place] receiving them.  If you do not have mychart- we will call you about results within 5 business days of [place] receiving them.  *please also note that you will see labs on mychart as soon as they post. I will later go in and write notes on them- will say "notes from Dr. Yong Channel"   Recommended follow up: Return in about 1 year (around 12/24/2023) for physical or sooner if needed.Schedule b4 you leave.

## 2022-12-23 NOTE — Progress Notes (Signed)
Phone: [phone]   Subjective:  Patient presents today for their annual physical. Chief complaint-noted.   See problem oriented charting- ROS- full  review of systems was completed and negative  except for: R knee pain, tinnitus stable and uses hearing aids, headaches, seasonal allergies  The following were reviewed and entered/updated in epic: Past Medical History:  Diagnosis Date   Allergy    venom anaphylaxis   Asthma    extrinsic   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   Complication of anesthesia    slow to wake up    Family history of breast cancer    Family history of melanoma    Family history of parotid cancer    GERD (gastroesophageal reflux disease)    Headache(784.0)    hx of migraines    History of kidney stones    Hyperlipidemia    Hypothyroidism    Nephrolithiasis     X 8,Dr Tannebaum   PONV (postoperative nausea and vomiting)    Prostate cancer ([place])    Tuberculosis    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 1    Patient Active Problem List   Diagnosis Date Noted   History of prostate cancer 01/26/2018    Priority: High   Hypothyroidism 07/06/2015    Priority: Medium    Anaphylaxis 05/23/2011    Priority: Medium    Hyperlipidemia 01/12/2009    Priority: Medium    Asthma 01/12/2009    Priority: Medium    Common migraine 01/12/2009    Priority: Medium    Genetic testing 02/26/2018    Priority: Low   Family history of breast cancer     Priority: Low   Family history of melanoma     Priority: Low   Family history of parotid cancer     Priority: Low   Plantar fasciitis of right foot 01/17/2018    Priority: Low   Eczema 01/11/2016    Priority: Low   Snoring 02/23/2015    Priority: Low   TINEA PEDIS 09/25/2009    Priority: Low   Allergic rhinitis 01/12/2009    Priority: Low   GERD 01/12/2009    Priority: Low   NEPHROLITHIASIS, HX OF 01/12/2009    Priority: Low   Acute non-recurrent sinusitis 10/11/2018   Chronic nonintractable headache 10/19/2017   Deviated septum  10/19/2017   Nasal turbinate hypertrophy 10/19/2017   Past Surgical History:  Procedure Laterality Date   CERVICAL SPINE SURGERY  11/2019   Dr. Annette Stable, disc replacements   COLONOSCOPY  2017   ELBOW SURGERY     [facility]   KNEE ARTHROSCOPY  2011   R knee; Dr Maureen Ralphs   LUMBAR FUSION  2004   Dr Trenton Gammon   LYMPHADENECTOMY Bilateral 03/25/2018   Procedure: LYMPHADENECTOMY, PELVIC;  Surgeon: Raynelle Bring, MD;  Location: [facility];  Service: Urology;  Laterality: Bilateral;   MICRODISCECTOMY LUMBAR  1999   L4-5,L5-S1;Dr. Poole   NASAL SEPTOPLASTY W/ TURBINOPLASTY     ROBOT ASSISTED LAPAROSCOPIC RADICAL PROSTATECTOMY N/A 03/25/2018   Procedure: XI ROBOTIC ASSISTED LAPAROSCOPIC RADICAL PROSTATECTOMY LEVEL 2;  Surgeon: Raynelle Bring, MD;  Location: [facility];  Service: Urology;  Laterality: N/A;   TONSILLECTOMY     VASECTOMY  2000    Family History  Problem Relation Age of Onset   Heart attack Father 85       died @ 65   Thyroid disease Mother    Heart attack Brother 66       1/2 brother  Crohn's disease Brother    Hypertension Paternal Grandmother    Heart disease Paternal Grandmother        MI in 60s   Diabetes Paternal Grandmother    Heart attack Paternal Grandfather 74   Heart attack Paternal Aunt 63   Breast cancer Sister 32   Melanoma Maternal Aunt    Cancer Maternal Grandfather        parotid mixed tumor- met lung   Colon cancer Neg Hx    Esophageal cancer Neg Hx    Pancreatic cancer Neg Hx    Prostate cancer Neg Hx    Rectal cancer Neg Hx    Stomach cancer Neg Hx     Medications- reviewed and updated Current Outpatient Medications  Medication Sig Dispense Refill   albuterol (VENTOLIN HFA) 108 (90 Base) MCG/ACT inhaler Inhale 1-2 puffs every 4-6 hours as needed for cough/wheeze 18 g 0   amitriptyline (ELAVIL) 10 MG tablet Take 1 tablet (10 mg total) by mouth at bedtime. 90 tablet 3   ASPIRIN 81 PO Take 81 mg by mouth daily.      atorvastatin (LIPITOR) 20 MG tablet Take 1  tablet (20 mg total) by mouth daily. 90 tablet 3   baclofen (LIORESAL) 10 MG tablet Take 1-2 tablets (10-20 mg total) by mouth 3 (three) times daily as needed for muscle spasms. 60 each 3   budesonide-formoterol (SYMBICORT) 80-4.5 MCG/ACT inhaler Inhale 2 puffs into the lungs 2 (two) times daily. 10.2 g 5   celecoxib (CELEBREX) 200 MG capsule Take 1 capsule (200 mg total) by mouth 2 times daily. 30 capsule 1   EPINEPHrine 0.3 mg/0.3 mL IJ SOAJ injection Inject 0.3 mg into the muscle once.     famotidine (PEPCID) 20 MG tablet Take 20 mg by mouth daily.     fexofenadine (ALLEGRA) 180 MG tablet Take 180 mg by mouth daily.      Ketorolac Tromethamine 15.75 MG/SPRAY SOLN One spray (15.75 mg) in 1 nostril (total dose: 15.75 mg) every 6 to 8 hours; maximum dose: 4 doses (63 mg)/day 1 each 5   levothyroxine (SYNTHROID) 100 MCG tablet Take 1 tablet (100 mcg total) by mouth daily before breakfast. 90 tablet 3   oxyCODONE-acetaminophen (PERCOCET) 10-325 MG tablet Take 1 tablet by mouth every 6 (six) hours as needed for pain (migraine.). 20 tablet 0   rizatriptan (MAXALT-MLT) 10 MG disintegrating tablet DISSOLVE 1 TABLET IN MOUTH ONCE DAILY AS NEEDED FOR MIGRAINE. 12 tablet 5   Spacer/Aero-Holding Chambers (OPTICHAMBER DIAMOND) MISC Use as directed 1 each 1   tiZANidine (ZANAFLEX) 2 MG tablet Take 1-2 tablets (2-4 mg total) by mouth at bedtime as needed for muscle spasms. 60 tablet 1   triamcinolone ointment (KENALOG) 0.1 % apply sparingly to affected areas Externally twice a day prn 90 days 60 g 3   fluticasone (FLONASE) 50 MCG/ACT nasal spray Place 1 spray into the nose at bedtime.      No current facility-administered medications for this visit.    Allergies-reviewed and updated Allergies  Allergen Reactions   Banana Anaphylaxis   Bee Venom Anaphylaxis    Also venom from wasps & hornets cause anaphylaxis; he carries Epi-Pen & has Medi Alert bracelet   Neomycin Sulfate Anaphylaxis    Anaphylactic  shock  Because of a history of documented adverse serious drug reaction;Medi Alert bracelet  is recommended   Strawberry Extract Anaphylaxis   Tape Anaphylaxis    adhesvie tape-rash, blisters   Venomil Mixed Vespid  [Mixed  Vespid Venom] Anaphylaxis   Neosporin [Bacitracin-Polymyxin B]    Percocet [Oxycodone-Acetaminophen] Nausea Only    Post surgery    Social History   Social History Narrative   Married. 2 kids-son and daughter. Daughter in [place]- was going to do PA school- now doing executive nannying. Son Eldridge grad- Recruitment consultant in [place].       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 19 scouts   Objective  Objective:  BP 136/88   Pulse (!) 58   Temp (!) 97 F (36.1 C)   Ht 6' (1.829 m)   Wt 204 lb 3.2 oz (92.6 kg)   SpO2 97%   BMI 27.69 kg/m  Gen: NAD, resting comfortably HEENT: Mucous membranes are moist. Oropharynx normal Neck: no  obvious thyromegaly- perhaps very mild CV: RRR no murmurs rubs or gallops Lungs: CTAB no crackles, wheeze, rhonchi Abdomen: soft/nontender/nondistended/normal bowel sounds. No rebound or guarding.  Ext: no edema Skin: warm, dry Neuro: grossly normal, moves all extremities, PERRLA    Assessment and Plan  57 y.o. male presenting for annual physical.  Health Maintenance counseling: 1. Anticipatory guidance: Patient counseled regarding regular dental exams -q6 months, eye exams -yearly,  avoiding smoking and second hand smoke , limiting alcohol to 2 beverages per day - 2 a week, no illicit drugs .   2. Risk factor reduction:  Advised patient of need for regular exercise and diet rich and fruits and vegetables to reduce risk of heart attack and stroke.  Exercise- limited right now- have discussed ways to try to work around this, doing some planking.  Diet/weight  management-weight within 2 pounds of last physical- trying to watch portion sizes and eat healthy foods.  Wt Readings from Last 3 Encounters:  12/23/22 204 lb 3.2 oz (92.6 kg)  03/06/22 198 lb (89.8 kg)  08/05/21 202 lb 6.4 oz (91.8 kg)  3. Immunizations/screenings/ancillary studies-declines COVID-19 vaccination for now- had covid July 2023, discussed Tdap today  Immunization History  Administered Date(s) Administered   Influenza,inj,Quad PF,6+ Mos 08/17/2013, 08/02/2019, 08/03/2020, 09/18/2022   Influenza-Unspecified 11/02/2015, 08/22/2016, 08/31/2017, 08/30/2018   PFIZER Comirnaty(Gray Top)Covid-19 Tri-Sucrose Vaccine 03/14/2021   PFIZER(Purple Top)SARS-COV-2 Vaccination 02/07/2020, 02/28/2020, 09/01/2020   Pfizer Covid-19 Vaccine Bivalent Booster 30yr & up 09/25/2021   Td 11/17/2001   Tdap 02/02/2012   Zoster Recombinat (Shingrix) 07/29/2018, 10/12/2018  4. Prostate cancer screening-  history of prostatectomy with Dr. BAlinda Money  Gleason was 5+4 = 9.  PSAs have significantly trended down-continued to be monitored by urology- states undetectable on last check- q6 month follow up  with them- next in march- will be 5 years.  Lab Results  Component Value Date   PSA <0.015 09/07/2020   PSA 0.015 06/25/2018   PSA 5.82 01/22/2018   5. Colon cancer screening -  colonoscopy 09/10/2016 with 10-year follow-up planned  6. Skin cancer screening--[facility] within last year. advised regular sunscreen use. Denies worrisome, changing, or new skin lesions.  7. Former smoker- 1.5 pack years quit in the 1980s.  No regular screening planned other than AAA scan at age 57.  Also offered urine- does this with urology  8. STD screening - opts out of monogamous  Status of chronic or acute concerns   #social update- got back from [place] cruise- really enjoyed it  #Right  knee pain from OA- unable to run has been hard for him- more sedentary. Biking bothers knee but thinks he can do recumbent. Down to 1  mile on his walks.  -on celebrex as needed through ortho   #Kidney stones- baclofen, oxycodone, tizanidine as needed, ketorolac nasal/sprix for kidney stones but works for migraines as well   #hyperlipidemia S: Medication:Atorvastatin 20 mg, aspirin 81 mg per patient preference primary prevention Lab Results  Component Value Date   CHOL 167 08/05/2021   HDL 51.00 08/05/2021   LDLCALC 86 08/05/2021   LDLDIRECT 88.0 08/02/2019   TRIG 146.0 08/05/2021   CHOLHDL 3 08/05/2021   A/P: close to ideal control and do not want to push cholesterol dose due to prediabetes risk -with family history we did opt to get ct calcium scoring to help [place] decide on goals and see if we should push LDL unde r70  #hypothyroidism S: compliant On thyroid medication-levothyroxine 100 mcg Lab Results  Component Value Date   TSH 3.32 08/05/2021   A/P:hopefully stable- update tsh today. Continue current meds for now   # Hyperglycemia/insulin resistance/prediabetes S:  Medication: none Exercise and diet-  see above Lab Results  Component Value Date   HGBA1C 6.0 08/05/2021   HGBA1C 5.7 (H) 08/03/2020   HGBA1C 5.8 08/02/2019   A/P: update a1c with labs- hoping at least stable but could be up with sedentary activity as compared to prior running   # Migraines S: Medication: Amitriptyline 10 mg (1 or 2 a month down from average 3-4 per week), Maxalt 10 mg as needed . If misses for 2 days on amitriptyline may get migraine A/P: reasonable control continue current medications      # Asthma/allergies-follows with allergist Dr. Remus Blake S: Maintenance Medication: Symbicort in the past- mainly in the spring, Allegra As needed medication: Albuterol.   A/P: both issues stable lately- continue current medications    #BP up compared to last year. Did have hour long meeting with financial advisor before this- some stress.  BP Readings from Last 3 Encounters:  12/23/22 136/88  08/05/21 118/84  08/03/20 118/82  Lets  monitor blood pressure at home in lower stress environment- goal <135/85 on home readings. Update me in 2 weeks with at least 5 readings. Either wife can check or omron series 3 is reasonable (usually around $35)  Recommended follow up: Return in about 1 year (around 12/24/2023) for physical or sooner if needed.Schedule b4 you leave.  Lab/Order associations: fasting   ICD-10-CM   1. Preventative health care  Z00.00 CBC with Differential/Platelet    Comprehensive metabolic panel    Lipid panel    TSH    HgB A1c    CT CARDIAC SCORING ([facility] LOCATIONS ONLY)    2. History of prostate cancer  Z85.46     3. Hyperlipidemia, unspecified hyperlipidemia type  E78.5 CBC with Differential/Platelet    Comprehensive metabolic panel    Lipid panel    CT CARDIAC SCORING ([facility] LOCATIONS ONLY)    4. Hypothyroidism, unspecified type  E03.9 TSH    5. Hyperglycemia  R73.9 HgB A1c      No orders of the defined types were placed in this encounter.   Return precautions advised.  Garret Reddish, MD

## 2023-01-04 ENCOUNTER — Other Ambulatory Visit (HOSPITAL_BASED_OUTPATIENT_CLINIC_OR_DEPARTMENT_OTHER): Payer: Self-pay

## 2023-01-05 ENCOUNTER — Other Ambulatory Visit: Payer: Self-pay

## 2023-01-05 ENCOUNTER — Other Ambulatory Visit (HOSPITAL_BASED_OUTPATIENT_CLINIC_OR_DEPARTMENT_OTHER): Payer: Self-pay

## 2023-01-05 MED ORDER — CELECOXIB 200 MG PO CAPS
ORAL_CAPSULE | ORAL | 1 refills | Status: DC
  Filled 2023-01-05: qty 60, 30d supply, fill #0

## 2023-01-15 ENCOUNTER — Ambulatory Visit
Admission: RE | Admit: 2023-01-15 | Discharge: 2023-01-15 | Disposition: A | Discharge status: Home / Self Care | Payer: Commercial Managed Care - PPO | Source: Ambulatory Visit | Attending: Family Medicine | Admitting: Family Medicine

## 2023-01-15 ENCOUNTER — Encounter: Payer: Self-pay | Admitting: Family Medicine

## 2023-01-15 DIAGNOSIS — Z0000 Encounter for general adult medical examination without abnormal findings: Secondary | ICD-10-CM

## 2023-01-15 DIAGNOSIS — E785 Hyperlipidemia, unspecified: Secondary | ICD-10-CM

## 2023-01-16 NOTE — Telephone Encounter (Signed)
See below

## 2023-01-19 DIAGNOSIS — J3081 Allergic rhinitis due to animal (cat) (dog) hair and dander: Secondary | ICD-10-CM | POA: Diagnosis not present

## 2023-01-19 DIAGNOSIS — J301 Allergic rhinitis due to pollen: Secondary | ICD-10-CM | POA: Diagnosis not present

## 2023-01-19 DIAGNOSIS — J3089 Other allergic rhinitis: Secondary | ICD-10-CM | POA: Diagnosis not present

## 2023-01-23 DIAGNOSIS — T63451D Toxic effect of venom of hornets, accidental (unintentional), subsequent encounter: Secondary | ICD-10-CM | POA: Diagnosis not present

## 2023-01-23 DIAGNOSIS — T63441D Toxic effect of venom of bees, accidental (unintentional), subsequent encounter: Secondary | ICD-10-CM | POA: Diagnosis not present

## 2023-01-23 DIAGNOSIS — T63461D Toxic effect of venom of wasps, accidental (unintentional), subsequent encounter: Secondary | ICD-10-CM | POA: Diagnosis not present

## 2023-01-26 DIAGNOSIS — J3081 Allergic rhinitis due to animal (cat) (dog) hair and dander: Secondary | ICD-10-CM | POA: Diagnosis not present

## 2023-01-26 DIAGNOSIS — J301 Allergic rhinitis due to pollen: Secondary | ICD-10-CM | POA: Diagnosis not present

## 2023-01-26 DIAGNOSIS — J3089 Other allergic rhinitis: Secondary | ICD-10-CM | POA: Diagnosis not present

## 2023-02-09 ENCOUNTER — Other Ambulatory Visit (HOSPITAL_BASED_OUTPATIENT_CLINIC_OR_DEPARTMENT_OTHER): Payer: Self-pay

## 2023-02-09 DIAGNOSIS — J3081 Allergic rhinitis due to animal (cat) (dog) hair and dander: Secondary | ICD-10-CM | POA: Diagnosis not present

## 2023-02-09 DIAGNOSIS — J45990 Exercise induced bronchospasm: Secondary | ICD-10-CM | POA: Diagnosis not present

## 2023-02-09 DIAGNOSIS — J3089 Other allergic rhinitis: Secondary | ICD-10-CM | POA: Diagnosis not present

## 2023-02-09 DIAGNOSIS — Z91018 Allergy to other foods: Secondary | ICD-10-CM | POA: Diagnosis not present

## 2023-02-09 DIAGNOSIS — J301 Allergic rhinitis due to pollen: Secondary | ICD-10-CM | POA: Diagnosis not present

## 2023-02-09 MED ORDER — ALBUTEROL SULFATE HFA 108 (90 BASE) MCG/ACT IN AERS
INHALATION_SPRAY | RESPIRATORY_TRACT | 0 refills | Status: DC
  Filled 2023-02-09: qty 6.7, 17d supply, fill #0

## 2023-02-09 MED ORDER — EPINEPHRINE 0.3 MG/0.3ML IJ SOAJ
INTRAMUSCULAR | 1 refills | Status: AC
  Filled 2023-02-09: qty 2, 30d supply, fill #0

## 2023-02-09 MED ORDER — BUDESONIDE-FORMOTEROL FUMARATE 80-4.5 MCG/ACT IN AERO
INHALATION_SPRAY | RESPIRATORY_TRACT | 5 refills | Status: AC
  Filled 2023-02-09: qty 10.2, 30d supply, fill #0
  Filled 2023-03-09: qty 10.2, 30d supply, fill #1

## 2023-02-10 ENCOUNTER — Other Ambulatory Visit: Payer: Self-pay

## 2023-02-12 ENCOUNTER — Other Ambulatory Visit (HOSPITAL_BASED_OUTPATIENT_CLINIC_OR_DEPARTMENT_OTHER): Payer: Self-pay

## 2023-02-26 DIAGNOSIS — T63451D Toxic effect of venom of hornets, accidental (unintentional), subsequent encounter: Secondary | ICD-10-CM | POA: Diagnosis not present

## 2023-02-26 DIAGNOSIS — T63461D Toxic effect of venom of wasps, accidental (unintentional), subsequent encounter: Secondary | ICD-10-CM | POA: Diagnosis not present

## 2023-02-26 DIAGNOSIS — T63441D Toxic effect of venom of bees, accidental (unintentional), subsequent encounter: Secondary | ICD-10-CM | POA: Diagnosis not present

## 2023-03-04 DIAGNOSIS — C61 Malignant neoplasm of prostate: Secondary | ICD-10-CM | POA: Diagnosis not present

## 2023-03-05 DIAGNOSIS — J3089 Other allergic rhinitis: Secondary | ICD-10-CM | POA: Diagnosis not present

## 2023-03-05 DIAGNOSIS — J3081 Allergic rhinitis due to animal (cat) (dog) hair and dander: Secondary | ICD-10-CM | POA: Diagnosis not present

## 2023-03-05 DIAGNOSIS — J301 Allergic rhinitis due to pollen: Secondary | ICD-10-CM | POA: Diagnosis not present

## 2023-03-09 ENCOUNTER — Other Ambulatory Visit (HOSPITAL_BASED_OUTPATIENT_CLINIC_OR_DEPARTMENT_OTHER): Payer: Self-pay

## 2023-03-09 MED ORDER — ALBUTEROL SULFATE HFA 108 (90 BASE) MCG/ACT IN AERS
INHALATION_SPRAY | RESPIRATORY_TRACT | 0 refills | Status: AC
  Filled 2023-03-09: qty 6.7, 17d supply, fill #0

## 2023-03-09 MED ORDER — CELECOXIB 200 MG PO CAPS
200.0000 mg | ORAL_CAPSULE | Freq: Two times a day (BID) | ORAL | 1 refills | Status: DC
  Filled 2023-03-09: qty 30, 15d supply, fill #0
  Filled 2023-04-01: qty 30, 15d supply, fill #1

## 2023-03-10 ENCOUNTER — Other Ambulatory Visit: Payer: Self-pay

## 2023-03-10 ENCOUNTER — Other Ambulatory Visit (HOSPITAL_BASED_OUTPATIENT_CLINIC_OR_DEPARTMENT_OTHER): Payer: Self-pay

## 2023-03-11 DIAGNOSIS — C61 Malignant neoplasm of prostate: Secondary | ICD-10-CM | POA: Diagnosis not present

## 2023-03-12 DIAGNOSIS — J3089 Other allergic rhinitis: Secondary | ICD-10-CM | POA: Diagnosis not present

## 2023-03-12 DIAGNOSIS — J301 Allergic rhinitis due to pollen: Secondary | ICD-10-CM | POA: Diagnosis not present

## 2023-03-19 DIAGNOSIS — J3089 Other allergic rhinitis: Secondary | ICD-10-CM | POA: Diagnosis not present

## 2023-03-19 DIAGNOSIS — J3081 Allergic rhinitis due to animal (cat) (dog) hair and dander: Secondary | ICD-10-CM | POA: Diagnosis not present

## 2023-03-19 DIAGNOSIS — J301 Allergic rhinitis due to pollen: Secondary | ICD-10-CM | POA: Diagnosis not present

## 2023-03-24 DIAGNOSIS — T63461D Toxic effect of venom of wasps, accidental (unintentional), subsequent encounter: Secondary | ICD-10-CM | POA: Diagnosis not present

## 2023-03-24 DIAGNOSIS — T63441D Toxic effect of venom of bees, accidental (unintentional), subsequent encounter: Secondary | ICD-10-CM | POA: Diagnosis not present

## 2023-04-07 DIAGNOSIS — J3089 Other allergic rhinitis: Secondary | ICD-10-CM | POA: Diagnosis not present

## 2023-04-07 DIAGNOSIS — J301 Allergic rhinitis due to pollen: Secondary | ICD-10-CM | POA: Diagnosis not present

## 2023-04-20 DIAGNOSIS — T63451D Toxic effect of venom of hornets, accidental (unintentional), subsequent encounter: Secondary | ICD-10-CM | POA: Diagnosis not present

## 2023-04-20 DIAGNOSIS — T63461D Toxic effect of venom of wasps, accidental (unintentional), subsequent encounter: Secondary | ICD-10-CM | POA: Diagnosis not present

## 2023-04-24 DIAGNOSIS — J301 Allergic rhinitis due to pollen: Secondary | ICD-10-CM | POA: Diagnosis not present

## 2023-04-24 DIAGNOSIS — J3089 Other allergic rhinitis: Secondary | ICD-10-CM | POA: Diagnosis not present

## 2023-04-24 DIAGNOSIS — J3081 Allergic rhinitis due to animal (cat) (dog) hair and dander: Secondary | ICD-10-CM | POA: Diagnosis not present

## 2023-05-15 DIAGNOSIS — J3089 Other allergic rhinitis: Secondary | ICD-10-CM | POA: Diagnosis not present

## 2023-05-15 DIAGNOSIS — J301 Allergic rhinitis due to pollen: Secondary | ICD-10-CM | POA: Diagnosis not present

## 2023-05-15 DIAGNOSIS — J3081 Allergic rhinitis due to animal (cat) (dog) hair and dander: Secondary | ICD-10-CM | POA: Diagnosis not present

## 2023-05-19 DIAGNOSIS — T63451D Toxic effect of venom of hornets, accidental (unintentional), subsequent encounter: Secondary | ICD-10-CM | POA: Diagnosis not present

## 2023-05-19 DIAGNOSIS — T63461D Toxic effect of venom of wasps, accidental (unintentional), subsequent encounter: Secondary | ICD-10-CM | POA: Diagnosis not present

## 2023-05-19 DIAGNOSIS — T63441D Toxic effect of venom of bees, accidental (unintentional), subsequent encounter: Secondary | ICD-10-CM | POA: Diagnosis not present

## 2023-06-02 DIAGNOSIS — J3089 Other allergic rhinitis: Secondary | ICD-10-CM | POA: Diagnosis not present

## 2023-06-02 DIAGNOSIS — J301 Allergic rhinitis due to pollen: Secondary | ICD-10-CM | POA: Diagnosis not present

## 2023-06-02 DIAGNOSIS — J3081 Allergic rhinitis due to animal (cat) (dog) hair and dander: Secondary | ICD-10-CM | POA: Diagnosis not present

## 2023-06-23 ENCOUNTER — Other Ambulatory Visit (HOSPITAL_BASED_OUTPATIENT_CLINIC_OR_DEPARTMENT_OTHER): Payer: Self-pay

## 2023-06-23 DIAGNOSIS — J301 Allergic rhinitis due to pollen: Secondary | ICD-10-CM | POA: Diagnosis not present

## 2023-06-23 DIAGNOSIS — J3081 Allergic rhinitis due to animal (cat) (dog) hair and dander: Secondary | ICD-10-CM | POA: Diagnosis not present

## 2023-06-23 DIAGNOSIS — J3089 Other allergic rhinitis: Secondary | ICD-10-CM | POA: Diagnosis not present

## 2023-06-23 DIAGNOSIS — N50811 Right testicular pain: Secondary | ICD-10-CM | POA: Diagnosis not present

## 2023-06-23 MED ORDER — MELOXICAM 15 MG PO TABS
15.0000 mg | ORAL_TABLET | Freq: Every day | ORAL | 1 refills | Status: DC
  Filled 2023-06-23 (×2): qty 15, 15d supply, fill #0
  Filled 2023-07-02 – 2023-07-03 (×2): qty 15, 15d supply, fill #1

## 2023-06-23 MED ORDER — METHYLPREDNISOLONE 4 MG PO TBPK
ORAL_TABLET | ORAL | 0 refills | Status: DC
  Filled 2023-06-23: qty 21, 6d supply, fill #0

## 2023-06-29 DIAGNOSIS — T63451D Toxic effect of venom of hornets, accidental (unintentional), subsequent encounter: Secondary | ICD-10-CM | POA: Diagnosis not present

## 2023-06-29 DIAGNOSIS — T63461D Toxic effect of venom of wasps, accidental (unintentional), subsequent encounter: Secondary | ICD-10-CM | POA: Diagnosis not present

## 2023-07-01 ENCOUNTER — Other Ambulatory Visit: Payer: Self-pay

## 2023-07-02 ENCOUNTER — Other Ambulatory Visit (HOSPITAL_BASED_OUTPATIENT_CLINIC_OR_DEPARTMENT_OTHER): Payer: Self-pay

## 2023-07-02 ENCOUNTER — Other Ambulatory Visit: Payer: Self-pay | Admitting: Family Medicine

## 2023-07-02 ENCOUNTER — Other Ambulatory Visit: Payer: Self-pay

## 2023-07-02 MED ORDER — AMITRIPTYLINE HCL 10 MG PO TABS
10.0000 mg | ORAL_TABLET | Freq: Every day | ORAL | 3 refills | Status: DC
  Filled 2023-07-02: qty 90, 90d supply, fill #0
  Filled 2023-09-29: qty 90, 90d supply, fill #1
  Filled 2023-12-29: qty 90, 90d supply, fill #2
  Filled 2024-03-27: qty 90, 90d supply, fill #3

## 2023-07-03 ENCOUNTER — Other Ambulatory Visit (HOSPITAL_BASED_OUTPATIENT_CLINIC_OR_DEPARTMENT_OTHER): Payer: Self-pay

## 2023-07-10 DIAGNOSIS — T63451D Toxic effect of venom of hornets, accidental (unintentional), subsequent encounter: Secondary | ICD-10-CM | POA: Diagnosis not present

## 2023-07-10 DIAGNOSIS — T63461D Toxic effect of venom of wasps, accidental (unintentional), subsequent encounter: Secondary | ICD-10-CM | POA: Diagnosis not present

## 2023-07-10 DIAGNOSIS — T63441D Toxic effect of venom of bees, accidental (unintentional), subsequent encounter: Secondary | ICD-10-CM | POA: Diagnosis not present

## 2023-07-21 DIAGNOSIS — C61 Malignant neoplasm of prostate: Secondary | ICD-10-CM | POA: Diagnosis not present

## 2023-07-21 DIAGNOSIS — J301 Allergic rhinitis due to pollen: Secondary | ICD-10-CM | POA: Diagnosis not present

## 2023-07-21 DIAGNOSIS — N50811 Right testicular pain: Secondary | ICD-10-CM | POA: Diagnosis not present

## 2023-07-21 DIAGNOSIS — J3089 Other allergic rhinitis: Secondary | ICD-10-CM | POA: Diagnosis not present

## 2023-07-23 DIAGNOSIS — J301 Allergic rhinitis due to pollen: Secondary | ICD-10-CM | POA: Diagnosis not present

## 2023-07-23 DIAGNOSIS — J3081 Allergic rhinitis due to animal (cat) (dog) hair and dander: Secondary | ICD-10-CM | POA: Diagnosis not present

## 2023-07-23 DIAGNOSIS — J3089 Other allergic rhinitis: Secondary | ICD-10-CM | POA: Diagnosis not present

## 2023-07-28 DIAGNOSIS — J3089 Other allergic rhinitis: Secondary | ICD-10-CM | POA: Diagnosis not present

## 2023-07-28 DIAGNOSIS — J301 Allergic rhinitis due to pollen: Secondary | ICD-10-CM | POA: Diagnosis not present

## 2023-07-31 DIAGNOSIS — T63451D Toxic effect of venom of hornets, accidental (unintentional), subsequent encounter: Secondary | ICD-10-CM | POA: Diagnosis not present

## 2023-07-31 DIAGNOSIS — T63461D Toxic effect of venom of wasps, accidental (unintentional), subsequent encounter: Secondary | ICD-10-CM | POA: Diagnosis not present

## 2023-07-31 DIAGNOSIS — T63441D Toxic effect of venom of bees, accidental (unintentional), subsequent encounter: Secondary | ICD-10-CM | POA: Diagnosis not present

## 2023-08-06 ENCOUNTER — Other Ambulatory Visit (HOSPITAL_BASED_OUTPATIENT_CLINIC_OR_DEPARTMENT_OTHER): Payer: Self-pay

## 2023-08-06 ENCOUNTER — Telehealth: Payer: Commercial Managed Care - PPO | Admitting: Physician Assistant

## 2023-08-06 DIAGNOSIS — J019 Acute sinusitis, unspecified: Secondary | ICD-10-CM

## 2023-08-06 DIAGNOSIS — B9689 Other specified bacterial agents as the cause of diseases classified elsewhere: Secondary | ICD-10-CM

## 2023-08-06 MED ORDER — AMOXICILLIN-POT CLAVULANATE 875-125 MG PO TABS
1.0000 | ORAL_TABLET | Freq: Two times a day (BID) | ORAL | 0 refills | Status: DC
Start: 2023-08-06 — End: 2023-12-28
  Filled 2023-08-06: qty 14, 7d supply, fill #0

## 2023-08-06 NOTE — Progress Notes (Signed)

## 2023-08-06 NOTE — Progress Notes (Signed)
I have spent 5 minutes in review of e-visit questionnaire, review and updating patient chart, medical decision making and response to patient.   Mia Milan Cody Jacklynn Dehaas, PA-C    

## 2023-08-07 DIAGNOSIS — J301 Allergic rhinitis due to pollen: Secondary | ICD-10-CM | POA: Diagnosis not present

## 2023-08-07 DIAGNOSIS — J3081 Allergic rhinitis due to animal (cat) (dog) hair and dander: Secondary | ICD-10-CM | POA: Diagnosis not present

## 2023-08-07 DIAGNOSIS — J3089 Other allergic rhinitis: Secondary | ICD-10-CM | POA: Diagnosis not present

## 2023-08-10 DIAGNOSIS — H524 Presbyopia: Secondary | ICD-10-CM | POA: Diagnosis not present

## 2023-08-18 DIAGNOSIS — J3089 Other allergic rhinitis: Secondary | ICD-10-CM | POA: Diagnosis not present

## 2023-08-18 DIAGNOSIS — J301 Allergic rhinitis due to pollen: Secondary | ICD-10-CM | POA: Diagnosis not present

## 2023-08-18 DIAGNOSIS — J3081 Allergic rhinitis due to animal (cat) (dog) hair and dander: Secondary | ICD-10-CM | POA: Diagnosis not present

## 2023-09-04 DIAGNOSIS — T63441D Toxic effect of venom of bees, accidental (unintentional), subsequent encounter: Secondary | ICD-10-CM | POA: Diagnosis not present

## 2023-09-04 DIAGNOSIS — T63451D Toxic effect of venom of hornets, accidental (unintentional), subsequent encounter: Secondary | ICD-10-CM | POA: Diagnosis not present

## 2023-09-04 DIAGNOSIS — T63461D Toxic effect of venom of wasps, accidental (unintentional), subsequent encounter: Secondary | ICD-10-CM | POA: Diagnosis not present

## 2023-09-11 DIAGNOSIS — J3089 Other allergic rhinitis: Secondary | ICD-10-CM | POA: Diagnosis not present

## 2023-09-11 DIAGNOSIS — J301 Allergic rhinitis due to pollen: Secondary | ICD-10-CM | POA: Diagnosis not present

## 2023-09-11 DIAGNOSIS — J3081 Allergic rhinitis due to animal (cat) (dog) hair and dander: Secondary | ICD-10-CM | POA: Diagnosis not present

## 2023-09-29 ENCOUNTER — Other Ambulatory Visit: Payer: Self-pay | Admitting: Family Medicine

## 2023-09-30 ENCOUNTER — Other Ambulatory Visit: Payer: Self-pay

## 2023-09-30 ENCOUNTER — Other Ambulatory Visit (HOSPITAL_BASED_OUTPATIENT_CLINIC_OR_DEPARTMENT_OTHER): Payer: Self-pay

## 2023-09-30 MED ORDER — LEVOTHYROXINE SODIUM 100 MCG PO TABS
100.0000 ug | ORAL_TABLET | Freq: Every day | ORAL | 3 refills | Status: DC
  Filled 2023-09-30 (×2): qty 90, 90d supply, fill #0

## 2023-09-30 MED ORDER — ATORVASTATIN CALCIUM 20 MG PO TABS
20.0000 mg | ORAL_TABLET | Freq: Every day | ORAL | 3 refills | Status: DC
  Filled 2023-09-30: qty 90, 90d supply, fill #0

## 2023-10-05 DIAGNOSIS — J3081 Allergic rhinitis due to animal (cat) (dog) hair and dander: Secondary | ICD-10-CM | POA: Diagnosis not present

## 2023-10-05 DIAGNOSIS — J3089 Other allergic rhinitis: Secondary | ICD-10-CM | POA: Diagnosis not present

## 2023-10-05 DIAGNOSIS — J301 Allergic rhinitis due to pollen: Secondary | ICD-10-CM | POA: Diagnosis not present

## 2023-10-12 DIAGNOSIS — T63451D Toxic effect of venom of hornets, accidental (unintentional), subsequent encounter: Secondary | ICD-10-CM | POA: Diagnosis not present

## 2023-10-12 DIAGNOSIS — T63441D Toxic effect of venom of bees, accidental (unintentional), subsequent encounter: Secondary | ICD-10-CM | POA: Diagnosis not present

## 2023-10-12 DIAGNOSIS — T63461D Toxic effect of venom of wasps, accidental (unintentional), subsequent encounter: Secondary | ICD-10-CM | POA: Diagnosis not present

## 2023-10-14 ENCOUNTER — Other Ambulatory Visit: Payer: Self-pay | Admitting: Medical Genetics

## 2023-10-21 ENCOUNTER — Other Ambulatory Visit: Payer: Self-pay | Admitting: Medical Genetics

## 2023-10-27 DIAGNOSIS — J3089 Other allergic rhinitis: Secondary | ICD-10-CM | POA: Diagnosis not present

## 2023-10-27 DIAGNOSIS — J301 Allergic rhinitis due to pollen: Secondary | ICD-10-CM | POA: Diagnosis not present

## 2023-11-02 DIAGNOSIS — J301 Allergic rhinitis due to pollen: Secondary | ICD-10-CM | POA: Diagnosis not present

## 2023-11-03 DIAGNOSIS — J3089 Other allergic rhinitis: Secondary | ICD-10-CM | POA: Diagnosis not present

## 2023-11-09 DIAGNOSIS — T63451D Toxic effect of venom of hornets, accidental (unintentional), subsequent encounter: Secondary | ICD-10-CM | POA: Diagnosis not present

## 2023-11-09 DIAGNOSIS — T63461D Toxic effect of venom of wasps, accidental (unintentional), subsequent encounter: Secondary | ICD-10-CM | POA: Diagnosis not present

## 2023-11-09 DIAGNOSIS — T63441D Toxic effect of venom of bees, accidental (unintentional), subsequent encounter: Secondary | ICD-10-CM | POA: Diagnosis not present

## 2023-11-16 ENCOUNTER — Other Ambulatory Visit (HOSPITAL_COMMUNITY)
Admission: RE | Admit: 2023-11-16 | Discharge: 2023-11-16 | Disposition: A | Discharge status: Home / Self Care | Payer: Self-pay | Source: Ambulatory Visit | Attending: Oncology | Admitting: Oncology

## 2023-11-19 DIAGNOSIS — J301 Allergic rhinitis due to pollen: Secondary | ICD-10-CM | POA: Diagnosis not present

## 2023-11-19 DIAGNOSIS — J3081 Allergic rhinitis due to animal (cat) (dog) hair and dander: Secondary | ICD-10-CM | POA: Diagnosis not present

## 2023-11-19 DIAGNOSIS — J3089 Other allergic rhinitis: Secondary | ICD-10-CM | POA: Diagnosis not present

## 2023-11-28 LAB — GENECONNECT MOLECULAR SCREEN: Genetic Analysis Overall Interpretation: NEGATIVE

## 2023-12-14 DIAGNOSIS — J301 Allergic rhinitis due to pollen: Secondary | ICD-10-CM | POA: Diagnosis not present

## 2023-12-14 DIAGNOSIS — J3081 Allergic rhinitis due to animal (cat) (dog) hair and dander: Secondary | ICD-10-CM | POA: Diagnosis not present

## 2023-12-14 DIAGNOSIS — J3089 Other allergic rhinitis: Secondary | ICD-10-CM | POA: Diagnosis not present

## 2023-12-16 ENCOUNTER — Other Ambulatory Visit (HOSPITAL_BASED_OUTPATIENT_CLINIC_OR_DEPARTMENT_OTHER): Payer: Self-pay

## 2023-12-16 DIAGNOSIS — M25561 Pain in right knee: Secondary | ICD-10-CM | POA: Diagnosis not present

## 2023-12-16 MED ORDER — METHYLPREDNISOLONE 4 MG PO TBPK
ORAL_TABLET | ORAL | 0 refills | Status: DC
  Filled 2023-12-16: qty 21, 6d supply, fill #0

## 2023-12-17 ENCOUNTER — Other Ambulatory Visit (HOSPITAL_BASED_OUTPATIENT_CLINIC_OR_DEPARTMENT_OTHER): Payer: Self-pay

## 2023-12-17 DIAGNOSIS — T63451D Toxic effect of venom of hornets, accidental (unintentional), subsequent encounter: Secondary | ICD-10-CM | POA: Diagnosis not present

## 2023-12-17 DIAGNOSIS — T63441D Toxic effect of venom of bees, accidental (unintentional), subsequent encounter: Secondary | ICD-10-CM | POA: Diagnosis not present

## 2023-12-17 DIAGNOSIS — T63461D Toxic effect of venom of wasps, accidental (unintentional), subsequent encounter: Secondary | ICD-10-CM | POA: Diagnosis not present

## 2023-12-28 ENCOUNTER — Encounter: Payer: Self-pay | Admitting: Family Medicine

## 2023-12-28 ENCOUNTER — Ambulatory Visit (INDEPENDENT_AMBULATORY_CARE_PROVIDER_SITE_OTHER): Payer: Commercial Managed Care - PPO | Admitting: Family Medicine

## 2023-12-28 VITALS — BP 138/82 | HR 68 | Temp 98.2°F | Ht 72.0 in | Wt 208.2 lb

## 2023-12-28 DIAGNOSIS — Z0001 Encounter for general adult medical examination with abnormal findings: Secondary | ICD-10-CM | POA: Diagnosis not present

## 2023-12-28 DIAGNOSIS — B9789 Other viral agents as the cause of diseases classified elsewhere: Secondary | ICD-10-CM | POA: Diagnosis not present

## 2023-12-28 DIAGNOSIS — R059 Cough, unspecified: Secondary | ICD-10-CM | POA: Diagnosis not present

## 2023-12-28 DIAGNOSIS — Z125 Encounter for screening for malignant neoplasm of prostate: Secondary | ICD-10-CM | POA: Diagnosis not present

## 2023-12-28 DIAGNOSIS — J329 Chronic sinusitis, unspecified: Secondary | ICD-10-CM | POA: Diagnosis not present

## 2023-12-28 DIAGNOSIS — E785 Hyperlipidemia, unspecified: Secondary | ICD-10-CM

## 2023-12-28 DIAGNOSIS — R6889 Other general symptoms and signs: Secondary | ICD-10-CM

## 2023-12-28 DIAGNOSIS — R739 Hyperglycemia, unspecified: Secondary | ICD-10-CM

## 2023-12-28 DIAGNOSIS — Z131 Encounter for screening for diabetes mellitus: Secondary | ICD-10-CM | POA: Diagnosis not present

## 2023-12-28 DIAGNOSIS — E039 Hypothyroidism, unspecified: Secondary | ICD-10-CM

## 2023-12-28 LAB — CBC WITH DIFFERENTIAL/PLATELET
Basophils Absolute: 0.1 10*3/uL (ref 0.0–0.1)
Basophils Relative: 0.7 % (ref 0.0–3.0)
Eosinophils Absolute: 0.6 10*3/uL (ref 0.0–0.7)
Eosinophils Relative: 5.6 % — ABNORMAL HIGH (ref 0.0–5.0)
HCT: 47.7 % (ref 39.0–52.0)
Hemoglobin: 16.2 g/dL (ref 13.0–17.0)
Lymphocytes Relative: 21.4 % (ref 12.0–46.0)
Lymphs Abs: 2.1 10*3/uL (ref 0.7–4.0)
MCHC: 33.9 g/dL (ref 30.0–36.0)
MCV: 92.6 fL (ref 78.0–100.0)
Monocytes Absolute: 0.9 10*3/uL (ref 0.1–1.0)
Monocytes Relative: 9.6 % (ref 3.0–12.0)
Neutro Abs: 6.2 10*3/uL (ref 1.4–7.7)
Neutrophils Relative %: 62.7 % (ref 43.0–77.0)
Platelets: 288 10*3/uL (ref 150.0–400.0)
RBC: 5.15 Mil/uL (ref 4.22–5.81)
RDW: 12.7 % (ref 11.5–15.5)
WBC: 9.9 10*3/uL (ref 4.0–10.5)

## 2023-12-28 LAB — TSH: TSH: 7.01 u[IU]/mL — ABNORMAL HIGH (ref 0.35–5.50)

## 2023-12-28 LAB — POCT INFLUENZA A/B
Influenza A, POC: NEGATIVE
Influenza B, POC: NEGATIVE

## 2023-12-28 LAB — COMPREHENSIVE METABOLIC PANEL
ALT: 141 U/L — ABNORMAL HIGH (ref 0–53)
AST: 74 U/L — ABNORMAL HIGH (ref 0–37)
Albumin: 4.4 g/dL (ref 3.5–5.2)
Alkaline Phosphatase: 89 U/L (ref 39–117)
BUN: 11 mg/dL (ref 6–23)
CO2: 29 meq/L (ref 19–32)
Calcium: 9.2 mg/dL (ref 8.4–10.5)
Chloride: 100 meq/L (ref 96–112)
Creatinine, Ser: 1 mg/dL (ref 0.40–1.50)
GFR: 83.61 mL/min (ref >60.00)
Glucose, Bld: 98 mg/dL (ref 70–99)
Potassium: 4 meq/L (ref 3.5–5.1)
Sodium: 139 meq/L (ref 135–145)
Total Bilirubin: 0.7 mg/dL (ref 0.2–1.2)
Total Protein: 6.9 g/dL (ref 6.0–8.3)

## 2023-12-28 LAB — LIPID PANEL
Cholesterol: 175 mg/dL (ref 0–200)
HDL: 52.7 mg/dL (ref >39.00)
LDL Cholesterol: 75 mg/dL (ref 0–99)
NonHDL: 122.48
Total CHOL/HDL Ratio: 3
Triglycerides: 239 mg/dL — ABNORMAL HIGH (ref 0.0–149.0)
VLDL: 47.8 mg/dL — ABNORMAL HIGH (ref 0.0–40.0)

## 2023-12-28 LAB — POC COVID19 BINAXNOW: SARS Coronavirus 2 Ag: NEGATIVE

## 2023-12-28 LAB — HEMOGLOBIN A1C: Hgb A1c MFr Bld: 6.1 % (ref 4.6–6.5)

## 2023-12-28 NOTE — Progress Notes (Signed)
 Phone: [phone]   Subjective:  Patient presents today for their annual physical. Chief complaint-noted.   See problem oriented charting- ROS- full  review of systems was completed and negative  except for: See separate note about upper respiratory, some knee pain- MRI scheduled with Dr. Ricardo Chamber  The following were reviewed and entered/updated in epic: Past Medical History:  Diagnosis Date   Allergy    venom anaphylaxis   Arthritis 2023   Patellofemoral Chondrosis Rt Knee   Asthma    extrinsic   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   Complication of anesthesia    slow to wake up    Family history of breast cancer    Family history of melanoma    Family history of parotid cancer    GERD (gastroesophageal reflux disease)    Headache(784.0)    hx of migraines    History of kidney stones    Hyperlipidemia    Hypothyroidism    Nephrolithiasis     X 8,Dr Tannebaum   PONV (postoperative nausea and vomiting)    Prostate cancer (HCC)    Tuberculosis    hx of ox 1    Patient Active Problem List   Diagnosis Date Noted   History of prostate cancer 01/26/2018    Priority: High   Hypothyroidism 07/06/2015    Priority: Medium    Anaphylaxis 05/23/2011    Priority: Medium    Hyperlipidemia 01/12/2009    Priority: Medium    Asthma 01/12/2009    Priority: Medium    Common migraine 01/12/2009    Priority: Medium    Genetic testing 02/26/2018    Priority: Low   Family history of breast cancer     Priority: Low   Family history of melanoma     Priority: Low   Family history of parotid cancer     Priority: Low   Plantar fasciitis of right foot 01/17/2018    Priority: Low   Eczema 01/11/2016    Priority: Low   Snoring 02/23/2015    Priority: Low   TINEA PEDIS 09/25/2009    Priority: Low   Allergic rhinitis 01/12/2009    Priority: Low   GERD 01/12/2009    Priority: Low   NEPHROLITHIASIS, HX OF 01/12/2009    Priority: Low   Acute non-recurrent sinusitis 10/11/2018    Chronic nonintractable headache 10/19/2017   Deviated septum 10/19/2017   Nasal turbinate hypertrophy 10/19/2017   Past Surgical History:  Procedure Laterality Date   CERVICAL SPINE SURGERY  11/2019   Dr. Adonis Alamin, disc replacements   COLONOSCOPY  2017   ELBOW SURGERY     Tommy Nashon's   FRACTURE SURGERY  2003   L4/L5/S1 double fusion   KNEE ARTHROSCOPY  2011   R knee; Dr Rossie Coon   LUMBAR FUSION  2004   Dr Gwendlyn Lemmings   LYMPHADENECTOMY Bilateral 03/25/2018   Procedure: LYMPHADENECTOMY, PELVIC;  Surgeon: Florencio Hunting, MD;  Location: [facility];  Service: Urology;  Laterality: Bilateral;   MICRODISCECTOMY LUMBAR  1999   L4-5,L5-S1;Dr. Poole   NASAL SEPTOPLASTY W/ TURBINOPLASTY     ROBOT ASSISTED LAPAROSCOPIC RADICAL PROSTATECTOMY N/A 03/25/2018   Procedure: XI ROBOTIC ASSISTED LAPAROSCOPIC RADICAL PROSTATECTOMY LEVEL 2;  Surgeon: Florencio Hunting, MD;  Location: [facility];  Service: Urology;  Laterality: N/A;   SPINE SURGERY  2021   C5/C6/C7 Disc Replacement   TONSILLECTOMY     VASECTOMY  2000    Family History  Problem Relation Age of Onset  Heart attack Father 53       died @ 74   Heart disease Father    Thyroid  disease Mother    Cancer Mother    Heart attack Brother 30       1/2 brother   Crohn's disease Brother    Hypertension Paternal Grandmother    Heart disease Paternal Grandmother        MI in 55s   Diabetes Paternal Grandmother    Heart attack Paternal Grandfather 3   Heart disease Paternal Grandfather    Heart attack Paternal Aunt 59   Breast cancer Sister 58   Cancer Sister    Melanoma Maternal Aunt    Cancer Maternal Grandfather        parotid mixed tumor- met lung   Colon cancer Neg Hx    Esophageal cancer Neg Hx    Pancreatic cancer Neg Hx    Prostate cancer Neg Hx    Rectal cancer Neg Hx    Stomach cancer Neg Hx     Medications- reviewed and updated Current Outpatient Medications  Medication Sig Dispense Refill   albuterol  (VENTOLIN  HFA) 108 (90 Base)  MCG/ACT inhaler Inhale 2 puffs into the lungs every 4 to 6 hours as needed for cough/wheeze. 6.7 g 0   amitriptyline  (ELAVIL ) 10 MG tablet Take 1 tablet (10 mg total) by mouth at bedtime. 90 tablet 3   ASPIRIN  81 PO Take 81 mg by mouth daily.      atorvastatin  (LIPITOR) 20 MG tablet Take 1 tablet (20 mg total) by mouth daily. 90 tablet 3   budesonide -formoterol  (SYMBICORT ) 80-4.5 MCG/ACT inhaler Inhale 2 puffs by mouth twice daily. 10.2 g 5   celecoxib  (CELEBREX ) 200 MG capsule Take 1 capsule (200 mg total) by mouth 2 (two) times daily. 30 capsule 1   EPINEPHrine  0.3 mg/0.3 mL IJ SOAJ injection Inject as directed. 2 each 1   famotidine  (PEPCID ) 20 MG tablet Take 20 mg by mouth daily.     fexofenadine (ALLEGRA) 180 MG tablet Take 180 mg by mouth daily.      fluticasone  (FLONASE ) 50 MCG/ACT nasal spray Place 1 spray into the nose at bedtime.      Ketorolac  Tromethamine  15.75 MG/SPRAY SOLN One spray (15.75 mg) in 1 nostril (total dose: 15.75 mg) every 6 to 8 hours; maximum dose: 4 doses (63 mg)/day 1 each 5   levothyroxine  (SYNTHROID ) 100 MCG tablet Take 1 tablet (100 mcg total) by mouth daily before breakfast. 90 tablet 3   Spacer/Aero-Holding Chambers (OPTICHAMBER DIAMOND ) MISC Use as directed 1 each 1   rizatriptan  (MAXALT -MLT) 10 MG disintegrating tablet DISSOLVE 1 TABLET IN MOUTH ONCE DAILY AS NEEDED FOR MIGRAINE. 12 tablet 5   No current facility-administered medications for this visit.    Allergies-reviewed and updated Allergies  Allergen Reactions   Banana Anaphylaxis   Bee Venom Anaphylaxis    Also venom from wasps & hornets cause anaphylaxis; he carries Epi-Pen & has Medi Alert bracelet   Neomycin Sulfate Anaphylaxis    Anaphylactic shock  Because of a history of documented adverse serious drug reaction;Medi Alert bracelet  is recommended   Strawberry Extract Anaphylaxis   Tape Anaphylaxis    adhesvie tape-rash, blisters   Venomil Mixed Vespid  [Mixed Vespid Venom] Anaphylaxis    Neosporin [Bacitracin-Polymyxin B]    Percocet [Oxycodone -Acetaminophen ] Nausea Only    Post surgery    Social History   Social History Narrative   Married. 2 kids-son and daughter. Daughter in [place]-  was going to do PA school- now doing executive nannying. Son [employer] grad- Public affairs consultant in [place].       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 19 scouts   Objective  Objective:  BP 138/82 (BP Location: Left Arm, Patient Position: Sitting, Cuff Size: Normal)   Pulse 68   Temp 98.2 F (36.8 C) (Temporal)   Ht 6' (1.829 m)   Wt 208 lb 3.2 oz (94.4 kg)   SpO2 98%   BMI 28.24 kg/m  Gen: NAD, resting comfortably HEENT: Mucous membranes are moist. Nasal turbinates erythematous and edematous, tympanic membrane normal bilaterally, pharynx with mild erythema and drainage Neck: no thyromegaly CV: RRR no murmurs rubs or gallops Lungs: CTAB no crackles, wheeze, rhonchi Abdomen: soft/nontender/nondistended/normal bowel sounds. No rebound or guarding.  Ext: no edema Skin: warm, dry Neuro: grossly normal, moves all extremities, PERRLA Declines genitourinary and rectal exam   Assessment and Plan  58 y.o. male presenting for annual physical.  Health Maintenance counseling: 1. Anticipatory guidance: Patient counseled regarding regular dental exams -q6 months, eye exams -yearly,  avoiding smoking and second hand smoke , limiting alcohol to 2 beverages per day - glass of wine a week, no illicit drugs .   2. Risk factor reduction:  Advised patient of need for regular exercise and diet rich and fruits and vegetables to reduce risk of heart attack and stroke.  Exercise- biking bothers knees, has y membership could use pool but doesn't love, rowing bothers it- big challenge and wants to address knee issues.  Diet/weight management-weight  up 4 pounds but just got back from a cruise- doing hello fresh 3-4 days a week and avoiding eating out- wants to work to get back under 200- got as low as 190 or 192 about 4 months ago but feels activity reduction is affecting him.  Wt Readings from Last 3 Encounters:  12/28/23 208 lb 3.2 oz (94.4 kg)  12/23/22 204 lb 3.2 oz (92.6 kg)  03/06/22 198 lb (89.8 kg)  3. Immunizations/screenings/ancillary studies-outside of COVID-19 and flu shot particular with being ill, discussed Prevnar 20 in the future due to asthma- could call back for nurse visit when feels better Immunization History  Administered Date(s) Administered   Influenza,inj,Quad PF,6+ Mos 08/17/2013, 08/02/2019, 08/03/2020, 09/18/2022   Influenza-Unspecified 11/02/2015, 08/22/2016, 08/31/2017, 08/30/2018, 08/05/2021   PFIZER Comirnaty(Gray Top)Covid-19 Tri-Sucrose Vaccine 03/14/2021   PFIZER(Purple Top)SARS-COV-2 Vaccination 02/07/2020, 02/28/2020, 09/01/2020   Pfizer Covid-19 Vaccine Bivalent Booster 70yrs & up 09/25/2021   Td 11/17/2001   Tdap 02/02/2012, 12/23/2022   Zoster Recombinant(Shingrix ) 07/29/2018, 10/12/2018  4. Prostate cancer screening- history of prostatectomy with Dr. Rozanne Corners Gleason was 5+4 equal 9.  Offered PSA but does this with urology. 5 years cancer free!   5. Colon cancer screening - 09/10/2016 with 10-year repeat planned 6. Skin cancer screening-works with [facility] dermatology- no recent visit. advised regular sunscreen use. Denies worrisome, changing, or new skin lesions.  7. Smoking associated screening (lung cancer screening, AAA screen 65-75, UA)-former smoker- quit in the 1980s with only 1.5 pack years.  No regular screening other than AAA scan at 65.  Get UA with urology 8. STD screening -opts out as monogamous  Status of chronic or acute concerns   # Social update-was got back from cruise-but got sick last few days -doing more  admin on scouts- but will have forms next year for [place]  trip  #hyperlipidemia-CT calcium  scoring of 0 on 01/15/2023 S: Medication:Atorvastatin  20 mg, aspirin  81 mg per patient preference primary prevention Lab Results  Component Value Date   CHOL 176 12/23/2022   HDL 45.50 12/23/2022   LDLCALC 86 08/05/2021   LDLDIRECT 90.0 12/23/2022   TRIG 226.0 (H) 12/23/2022   CHOLHDL 4 12/23/2022   A/P: cholesterol mildly above ideal goal but with score of 0 on CT calcium  we opted to stay steady on dose  #hypothyroidism S: compliant On thyroid  medication-levothyroxine  100 mcg Lab Results  Component Value Date   TSH 3.24 12/23/2022  A/P:hopefully stable- update tsh today. Continue current meds for now     # Migraines S: Medication: Amitriptyline  10 mg- with migraine maybe 1 every 2 weeks, Maxalt  10 mg as needed or sprix - may use   A/P: doing well- continue current medications      # Asthma/allergies-follows with allergist Dr. Phill Brazil S: Maintenance Medication: Symbicort  prescription- took twice on ship, Allegra, Flonase  As needed medication: Albuterol .  A/P: overall stable even with recent illlness- continue to monitor    #Right knee pain from OA-working with Dr. Ricardo Chamber and pending MRI  #Kidney stones- one in last 6 years- thankfully at bay  #BP high normal- continue to monitor- work on weight loss  Recommended follow up: Return in about 1 year (around 12/27/2024) for physical or sooner if needed.Schedule b4 you leave.  Lab/Order associations: fasting   ICD-10-CM   1. Preventative health care  Z00.00     2. Hyperlipidemia, unspecified hyperlipidemia type  E78.5 Comprehensive metabolic panel    CBC with Differential/Platelet    Lipid panel    3. Hypothyroidism, unspecified type  E03.9 TSH    4. Screening for prostate cancer  Z12.5 CANCELED: PSA    5. Screening for diabetes mellitus  Z13.1 Hemoglobin A1c    6. Hyperglycemia  R73.9 Hemoglobin A1c    7. Cough, unspecified type  R05.9 POC COVID-19 BinaxNow    8. Flu-like symptoms   R68.89 POCT Influenza A/B      No orders of the defined types were placed in this encounter.   Return precautions advised.  Clarisa Crooked, MD

## 2023-12-28 NOTE — Patient Instructions (Addendum)
 Health Maintenance Due  Topic Date Due   Pneumococcal Vaccine 65-58 Years old (1 of 2 - PCV) Never done  In future and if you decide to do flu- can call back for nurse visit  Please stop by lab before you go If you have mychart- we will send your results within 3 business days of us  receiving them.  If you do not have mychart- we will call you about results within 5 business days of us  receiving them.  *please also note that you will see labs on mychart as soon as they post. I will later go in and write notes on them- will say "notes from Dr. Arlene Ben"   patient with productive cough and productive when blows nose as well on day 6 but slightly improving- suspect viral sinusitis- we discussed this far in that he should be improving daily- if he gets worse or is not significantly better by Friday would be a sign of potential bacterial superinfection and I need him to call or mychart me if either of those happen and will send in antibiotic.   - likely augmentin   Recommended follow up: Return in about 1 year (around 12/27/2024) for physical or sooner if needed.Schedule b4 you leave.

## 2023-12-28 NOTE — Progress Notes (Signed)
 Phone [phone] In person visit   Subjective:   Aaron Gomez is a 58 y.o. year old very pleasant male patient who presents for/with See problem oriented charting  Past Medical History-  Patient Active Problem List   Diagnosis Date Noted   History of prostate cancer 01/26/2018    Priority: High   Hypothyroidism 07/06/2015    Priority: Medium    Anaphylaxis 05/23/2011    Priority: Medium    Hyperlipidemia 01/12/2009    Priority: Medium    Asthma 01/12/2009    Priority: Medium    Common migraine 01/12/2009    Priority: Medium    Genetic testing 02/26/2018    Priority: Low   Family history of breast cancer     Priority: Low   Family history of melanoma     Priority: Low   Family history of parotid cancer     Priority: Low   Plantar fasciitis of right foot 01/17/2018    Priority: Low   Eczema 01/11/2016    Priority: Low   Snoring 02/23/2015    Priority: Low   TINEA PEDIS 09/25/2009    Priority: Low   Allergic rhinitis 01/12/2009    Priority: Low   GERD 01/12/2009    Priority: Low   NEPHROLITHIASIS, HX OF 01/12/2009    Priority: Low   Acute non-recurrent sinusitis 10/11/2018   Chronic nonintractable headache 10/19/2017   Deviated septum 10/19/2017   Nasal turbinate hypertrophy 10/19/2017    Medications- reviewed and updated Current Outpatient Medications  Medication Sig Dispense Refill   albuterol  (VENTOLIN  HFA) 108 (90 Base) MCG/ACT inhaler Inhale 2 puffs into the lungs every 4 to 6 hours as needed for cough/wheeze. 6.7 g 0   amitriptyline  (ELAVIL ) 10 MG tablet Take 1 tablet (10 mg total) by mouth at bedtime. 90 tablet 3   ASPIRIN  81 PO Take 81 mg by mouth daily.      atorvastatin  (LIPITOR) 20 MG tablet Take 1 tablet (20 mg total) by mouth daily. 90 tablet 3   budesonide -formoterol  (SYMBICORT ) 80-4.5 MCG/ACT inhaler Inhale 2 puffs by mouth twice daily. 10.2 g 5   celecoxib  (CELEBREX ) 200 MG capsule Take 1 capsule (200 mg total) by mouth 2 (two) times daily.  30 capsule 1   EPINEPHrine  0.3 mg/0.3 mL IJ SOAJ injection Inject as directed. 2 each 1   famotidine  (PEPCID ) 20 MG tablet Take 20 mg by mouth daily.     fexofenadine (ALLEGRA) 180 MG tablet Take 180 mg by mouth daily.      fluticasone  (FLONASE ) 50 MCG/ACT nasal spray Place 1 spray into the nose at bedtime.      Ketorolac  Tromethamine  15.75 MG/SPRAY SOLN One spray (15.75 mg) in 1 nostril (total dose: 15.75 mg) every 6 to 8 hours; maximum dose: 4 doses (63 mg)/day 1 each 5   levothyroxine  (SYNTHROID ) 100 MCG tablet Take 1 tablet (100 mcg total) by mouth daily before breakfast. 90 tablet 3   Spacer/Aero-Holding Chambers (OPTICHAMBER DIAMOND ) MISC Use as directed 1 each 1   rizatriptan  (MAXALT -MLT) 10 MG disintegrating tablet DISSOLVE 1 TABLET IN MOUTH ONCE DAILY AS NEEDED FOR MIGRAINE. 12 tablet 5   No current facility-administered medications for this visit.     Objective:  BP 138/82 (BP Location: Left Arm, Patient Position: Sitting, Cuff Size: Normal)   Pulse 68   Temp 98.2 F (36.8 C) (Temporal)   Ht 6' (1.829 m)   Wt 208 lb 3.2 oz (94.4 kg)   SpO2 98%   BMI 28.24  kg/m  Gen: NAD, resting comfortably See separate note    Assessment and Plan   # Productive cough S:went on cruise and midway through developed scratchy throat - around the 5th. Reports was on steroid taper for knee at the time-may have been immunosuppressed as result. Day 6 of illness today. No known fever. Some chills and sweats better now. No shortness of breath but talking will start cough cycle. Productive of green sputum. Hot shower to try to break things up and Neti pot. No sinus pressure. Feels like mainly upper chest. Asthma doesn't feel flared- only used Symbicort . Feeling slightly better today. Some mouth breathing. Productive when blows nose. No chest pain.  A/P: patient with productive cough and productive when blows nose as well on day 6 but slightly improving- suspect viral sinusitis- we discussed this far in  that he should be improving daily- if he gets worse or is not significantly bteter by Friday would be a sign of potential bacterial superinfection and I need him to call or mychart me if either of those happen and will send in antibiotic.    Recommended follow up: Return in about 1 year (around 12/27/2024) for physical or sooner if needed.Schedule b4 you leave.  Lab/Order associations:   ICD-10-CM   1. Viral sinusitis  J32.9    B97.89    No orders of the defined types were placed in this encounter.  Return precautions advised.  Clarisa Crooked, MD

## 2023-12-29 ENCOUNTER — Encounter (HOSPITAL_COMMUNITY): Payer: Self-pay

## 2023-12-29 ENCOUNTER — Other Ambulatory Visit (HOSPITAL_COMMUNITY): Payer: Self-pay

## 2023-12-29 ENCOUNTER — Other Ambulatory Visit (HOSPITAL_BASED_OUTPATIENT_CLINIC_OR_DEPARTMENT_OTHER): Payer: Self-pay

## 2023-12-29 MED ORDER — AMOXICILLIN-POT CLAVULANATE 875-125 MG PO TABS
1.0000 | ORAL_TABLET | Freq: Two times a day (BID) | ORAL | 0 refills | Status: AC
  Filled 2023-12-29 – 2023-12-30 (×2): qty 14, 7d supply, fill #0

## 2023-12-29 MED ORDER — LEVOTHYROXINE SODIUM 112 MCG PO TABS
112.0000 ug | ORAL_TABLET | Freq: Every day | ORAL | 3 refills | Status: DC
  Filled 2023-12-29 (×2): qty 90, 90d supply, fill #0
  Filled 2024-03-27: qty 90, 90d supply, fill #1
  Filled 2024-06-23: qty 90, 90d supply, fill #2
  Filled 2024-09-24: qty 90, 90d supply, fill #3

## 2023-12-29 NOTE — Addendum Note (Signed)
Addended by: Filomena Jungling on: 12/29/2023 11:52 AM   Modules accepted: Orders

## 2023-12-30 ENCOUNTER — Other Ambulatory Visit (HOSPITAL_BASED_OUTPATIENT_CLINIC_OR_DEPARTMENT_OTHER): Payer: Self-pay

## 2023-12-30 ENCOUNTER — Other Ambulatory Visit (HOSPITAL_COMMUNITY): Payer: Self-pay

## 2024-01-07 DIAGNOSIS — M25561 Pain in right knee: Secondary | ICD-10-CM | POA: Diagnosis not present

## 2024-01-12 DIAGNOSIS — J3081 Allergic rhinitis due to animal (cat) (dog) hair and dander: Secondary | ICD-10-CM | POA: Diagnosis not present

## 2024-01-12 DIAGNOSIS — J301 Allergic rhinitis due to pollen: Secondary | ICD-10-CM | POA: Diagnosis not present

## 2024-01-12 DIAGNOSIS — J3089 Other allergic rhinitis: Secondary | ICD-10-CM | POA: Diagnosis not present

## 2024-01-14 DIAGNOSIS — M2241 Chondromalacia patellae, right knee: Secondary | ICD-10-CM | POA: Diagnosis not present

## 2024-01-18 DIAGNOSIS — T63451D Toxic effect of venom of hornets, accidental (unintentional), subsequent encounter: Secondary | ICD-10-CM | POA: Diagnosis not present

## 2024-01-18 DIAGNOSIS — T63441D Toxic effect of venom of bees, accidental (unintentional), subsequent encounter: Secondary | ICD-10-CM | POA: Diagnosis not present

## 2024-01-18 DIAGNOSIS — T63461D Toxic effect of venom of wasps, accidental (unintentional), subsequent encounter: Secondary | ICD-10-CM | POA: Diagnosis not present

## 2024-02-01 ENCOUNTER — Encounter: Payer: Self-pay | Admitting: Family Medicine

## 2024-02-01 DIAGNOSIS — J301 Allergic rhinitis due to pollen: Secondary | ICD-10-CM | POA: Diagnosis not present

## 2024-02-01 DIAGNOSIS — J3089 Other allergic rhinitis: Secondary | ICD-10-CM | POA: Diagnosis not present

## 2024-02-01 DIAGNOSIS — J3081 Allergic rhinitis due to animal (cat) (dog) hair and dander: Secondary | ICD-10-CM | POA: Diagnosis not present

## 2024-02-08 ENCOUNTER — Other Ambulatory Visit: Payer: Self-pay

## 2024-02-08 ENCOUNTER — Other Ambulatory Visit

## 2024-02-08 DIAGNOSIS — E785 Hyperlipidemia, unspecified: Secondary | ICD-10-CM

## 2024-02-08 DIAGNOSIS — J3089 Other allergic rhinitis: Secondary | ICD-10-CM | POA: Diagnosis not present

## 2024-02-08 DIAGNOSIS — J301 Allergic rhinitis due to pollen: Secondary | ICD-10-CM | POA: Diagnosis not present

## 2024-02-08 DIAGNOSIS — E039 Hypothyroidism, unspecified: Secondary | ICD-10-CM

## 2024-02-08 DIAGNOSIS — J3081 Allergic rhinitis due to animal (cat) (dog) hair and dander: Secondary | ICD-10-CM | POA: Diagnosis not present

## 2024-02-08 LAB — COMPREHENSIVE METABOLIC PANEL
ALT: 88 U/L — ABNORMAL HIGH (ref 0–53)
AST: 41 U/L — ABNORMAL HIGH (ref 0–37)
Albumin: 4.5 g/dL (ref 3.5–5.2)
Alkaline Phosphatase: 72 U/L (ref 39–117)
BUN: 14 mg/dL (ref 6–23)
CO2: 28 meq/L (ref 19–32)
Calcium: 9.4 mg/dL (ref 8.4–10.5)
Chloride: 102 meq/L (ref 96–112)
Creatinine, Ser: 1.12 mg/dL (ref 0.40–1.50)
GFR: 72.92 mL/min (ref >60.00)
Glucose, Bld: 101 mg/dL — ABNORMAL HIGH (ref 70–99)
Potassium: 4.1 meq/L (ref 3.5–5.1)
Sodium: 137 meq/L (ref 135–145)
Total Bilirubin: 0.7 mg/dL (ref 0.2–1.2)
Total Protein: 7 g/dL (ref 6.0–8.3)

## 2024-02-08 LAB — TSH: TSH: 2.42 u[IU]/mL (ref 0.35–5.50)

## 2024-02-16 DIAGNOSIS — Z91018 Allergy to other foods: Secondary | ICD-10-CM | POA: Diagnosis not present

## 2024-02-16 DIAGNOSIS — J45990 Exercise induced bronchospasm: Secondary | ICD-10-CM | POA: Diagnosis not present

## 2024-02-16 DIAGNOSIS — J301 Allergic rhinitis due to pollen: Secondary | ICD-10-CM | POA: Diagnosis not present

## 2024-02-16 DIAGNOSIS — J3089 Other allergic rhinitis: Secondary | ICD-10-CM | POA: Diagnosis not present

## 2024-03-01 DIAGNOSIS — J3089 Other allergic rhinitis: Secondary | ICD-10-CM | POA: Diagnosis not present

## 2024-03-01 DIAGNOSIS — J301 Allergic rhinitis due to pollen: Secondary | ICD-10-CM | POA: Diagnosis not present

## 2024-03-04 ENCOUNTER — Other Ambulatory Visit (HOSPITAL_BASED_OUTPATIENT_CLINIC_OR_DEPARTMENT_OTHER): Payer: Self-pay

## 2024-03-04 ENCOUNTER — Other Ambulatory Visit: Payer: Self-pay | Admitting: Family Medicine

## 2024-03-04 MED ORDER — METHOCARBAMOL 500 MG PO TABS
500.0000 mg | ORAL_TABLET | Freq: Four times a day (QID) | ORAL | 0 refills | Status: AC | PRN
  Filled 2024-03-04: qty 40, 10d supply, fill #0

## 2024-03-04 MED ORDER — HYDROCODONE-ACETAMINOPHEN 5-325 MG PO TABS
1.0000 | ORAL_TABLET | Freq: Four times a day (QID) | ORAL | 0 refills | Status: AC | PRN
  Filled 2024-03-04: qty 20, 5d supply, fill #0

## 2024-03-04 MED ORDER — ASPIRIN 81 MG PO TBEC
81.0000 mg | DELAYED_RELEASE_TABLET | Freq: Every day | ORAL | 0 refills | Status: AC
  Filled 2024-03-04: qty 21, 21d supply, fill #0

## 2024-03-07 ENCOUNTER — Other Ambulatory Visit (HOSPITAL_BASED_OUTPATIENT_CLINIC_OR_DEPARTMENT_OTHER): Payer: Self-pay

## 2024-03-07 ENCOUNTER — Other Ambulatory Visit: Payer: Self-pay | Admitting: Family Medicine

## 2024-03-08 ENCOUNTER — Other Ambulatory Visit (HOSPITAL_BASED_OUTPATIENT_CLINIC_OR_DEPARTMENT_OTHER): Payer: Self-pay

## 2024-03-08 DIAGNOSIS — M2241 Chondromalacia patellae, right knee: Secondary | ICD-10-CM | POA: Diagnosis not present

## 2024-03-08 DIAGNOSIS — M958 Other specified acquired deformities of musculoskeletal system: Secondary | ICD-10-CM | POA: Diagnosis not present

## 2024-03-08 DIAGNOSIS — G8918 Other acute postprocedural pain: Secondary | ICD-10-CM | POA: Diagnosis not present

## 2024-03-08 MED ORDER — RIZATRIPTAN BENZOATE 10 MG PO TBDP
10.0000 mg | ORAL_TABLET | Freq: Every day | ORAL | 5 refills | Status: AC | PRN
  Filled 2024-03-08: qty 12, 30d supply, fill #0
  Filled 2024-06-23: qty 12, 30d supply, fill #1

## 2024-03-08 MED ORDER — ONDANSETRON HCL 4 MG PO TABS
4.0000 mg | ORAL_TABLET | Freq: Four times a day (QID) | ORAL | 0 refills | Status: AC | PRN
  Filled 2024-03-08: qty 20, 5d supply, fill #0

## 2024-03-17 DIAGNOSIS — J3089 Other allergic rhinitis: Secondary | ICD-10-CM | POA: Diagnosis not present

## 2024-03-17 DIAGNOSIS — J3081 Allergic rhinitis due to animal (cat) (dog) hair and dander: Secondary | ICD-10-CM | POA: Diagnosis not present

## 2024-03-17 DIAGNOSIS — J301 Allergic rhinitis due to pollen: Secondary | ICD-10-CM | POA: Diagnosis not present

## 2024-03-28 ENCOUNTER — Other Ambulatory Visit: Payer: Self-pay

## 2024-03-28 DIAGNOSIS — J3089 Other allergic rhinitis: Secondary | ICD-10-CM | POA: Diagnosis not present

## 2024-03-28 DIAGNOSIS — C61 Malignant neoplasm of prostate: Secondary | ICD-10-CM | POA: Diagnosis not present

## 2024-03-28 DIAGNOSIS — J301 Allergic rhinitis due to pollen: Secondary | ICD-10-CM | POA: Diagnosis not present

## 2024-03-28 DIAGNOSIS — J3081 Allergic rhinitis due to animal (cat) (dog) hair and dander: Secondary | ICD-10-CM | POA: Diagnosis not present

## 2024-03-29 ENCOUNTER — Other Ambulatory Visit (HOSPITAL_BASED_OUTPATIENT_CLINIC_OR_DEPARTMENT_OTHER): Payer: Self-pay

## 2024-04-04 DIAGNOSIS — C61 Malignant neoplasm of prostate: Secondary | ICD-10-CM | POA: Diagnosis not present

## 2024-04-15 DIAGNOSIS — T63441D Toxic effect of venom of bees, accidental (unintentional), subsequent encounter: Secondary | ICD-10-CM | POA: Diagnosis not present

## 2024-04-15 DIAGNOSIS — T63451D Toxic effect of venom of hornets, accidental (unintentional), subsequent encounter: Secondary | ICD-10-CM | POA: Diagnosis not present

## 2024-04-15 DIAGNOSIS — T63461D Toxic effect of venom of wasps, accidental (unintentional), subsequent encounter: Secondary | ICD-10-CM | POA: Diagnosis not present

## 2024-04-26 DIAGNOSIS — J301 Allergic rhinitis due to pollen: Secondary | ICD-10-CM | POA: Diagnosis not present

## 2024-04-26 DIAGNOSIS — J3089 Other allergic rhinitis: Secondary | ICD-10-CM | POA: Diagnosis not present

## 2024-04-26 DIAGNOSIS — J3081 Allergic rhinitis due to animal (cat) (dog) hair and dander: Secondary | ICD-10-CM | POA: Diagnosis not present

## 2024-05-17 DIAGNOSIS — J301 Allergic rhinitis due to pollen: Secondary | ICD-10-CM | POA: Diagnosis not present

## 2024-05-17 DIAGNOSIS — J3089 Other allergic rhinitis: Secondary | ICD-10-CM | POA: Diagnosis not present

## 2024-05-17 DIAGNOSIS — J3081 Allergic rhinitis due to animal (cat) (dog) hair and dander: Secondary | ICD-10-CM | POA: Diagnosis not present

## 2024-05-19 DIAGNOSIS — T63441D Toxic effect of venom of bees, accidental (unintentional), subsequent encounter: Secondary | ICD-10-CM | POA: Diagnosis not present

## 2024-05-19 DIAGNOSIS — T63461D Toxic effect of venom of wasps, accidental (unintentional), subsequent encounter: Secondary | ICD-10-CM | POA: Diagnosis not present

## 2024-05-19 DIAGNOSIS — T63451D Toxic effect of venom of hornets, accidental (unintentional), subsequent encounter: Secondary | ICD-10-CM | POA: Diagnosis not present

## 2024-06-07 ENCOUNTER — Encounter: Payer: Self-pay | Admitting: Family Medicine

## 2024-06-07 DIAGNOSIS — J301 Allergic rhinitis due to pollen: Secondary | ICD-10-CM | POA: Diagnosis not present

## 2024-06-07 DIAGNOSIS — J3081 Allergic rhinitis due to animal (cat) (dog) hair and dander: Secondary | ICD-10-CM | POA: Diagnosis not present

## 2024-06-07 DIAGNOSIS — J3089 Other allergic rhinitis: Secondary | ICD-10-CM | POA: Diagnosis not present

## 2024-06-08 ENCOUNTER — Other Ambulatory Visit: Payer: Self-pay

## 2024-06-08 DIAGNOSIS — R0683 Snoring: Secondary | ICD-10-CM

## 2024-06-08 NOTE — Telephone Encounter (Signed)
 See below

## 2024-06-16 DIAGNOSIS — T63451D Toxic effect of venom of hornets, accidental (unintentional), subsequent encounter: Secondary | ICD-10-CM | POA: Diagnosis not present

## 2024-06-16 DIAGNOSIS — T63461D Toxic effect of venom of wasps, accidental (unintentional), subsequent encounter: Secondary | ICD-10-CM | POA: Diagnosis not present

## 2024-06-23 ENCOUNTER — Other Ambulatory Visit: Payer: Self-pay | Admitting: Family Medicine

## 2024-06-24 ENCOUNTER — Other Ambulatory Visit (HOSPITAL_BASED_OUTPATIENT_CLINIC_OR_DEPARTMENT_OTHER): Payer: Self-pay

## 2024-06-24 ENCOUNTER — Other Ambulatory Visit: Payer: Self-pay

## 2024-06-24 MED ORDER — AMITRIPTYLINE HCL 10 MG PO TABS
10.0000 mg | ORAL_TABLET | Freq: Every day | ORAL | 1 refills | Status: DC
  Filled 2024-06-24: qty 90, 90d supply, fill #0
  Filled 2024-09-24: qty 90, 90d supply, fill #1

## 2024-06-24 NOTE — Telephone Encounter (Signed)
 Last OV 12/28/23 Next OV 01/03/25  Last refill 07/02/23 Qty #90/3

## 2024-06-27 DIAGNOSIS — J3089 Other allergic rhinitis: Secondary | ICD-10-CM | POA: Diagnosis not present

## 2024-06-27 DIAGNOSIS — J301 Allergic rhinitis due to pollen: Secondary | ICD-10-CM | POA: Diagnosis not present

## 2024-06-30 ENCOUNTER — Ambulatory Visit (HOSPITAL_BASED_OUTPATIENT_CLINIC_OR_DEPARTMENT_OTHER): Admitting: Adult Health

## 2024-06-30 ENCOUNTER — Encounter (HOSPITAL_BASED_OUTPATIENT_CLINIC_OR_DEPARTMENT_OTHER): Payer: Self-pay | Admitting: Adult Health

## 2024-06-30 VITALS — BP 148/91 | HR 69 | Ht 72.0 in | Wt 206.0 lb

## 2024-06-30 DIAGNOSIS — Z87891 Personal history of nicotine dependence: Secondary | ICD-10-CM | POA: Diagnosis not present

## 2024-06-30 DIAGNOSIS — R0683 Snoring: Secondary | ICD-10-CM

## 2024-06-30 NOTE — Progress Notes (Signed)
 Epworth Sleepiness Scale  Use the following scale to choose the most appropriate number for each situation. 0 Would never nod off 1  Slight  chance of nodding off 2 Moderate chance of nodding off 3 High chance of nodding off  Sitting and reading: 0 Watching TV: 0 Sitting, inactive, in a public place (e.g., in a meeting, theater, or dinner event): 0 As a passenger in a car for an hour or more without stopping for a break: 0 Lying down to rest when circumstances permit:1 Sitting and talking to someone: 0 Sitting quietly after a meal without alcohol: 0 In a car, while stopped for a few  minutes in traffic or at a light: 0  TOTOAL: 1

## 2024-06-30 NOTE — Progress Notes (Signed)
 @Patient  ID: Aaron Gomez, male    DOB: May 13, 1966, 58 y.o.   MRN: [id number]  Chief Complaint  Patient presents with   Establish Care    New Sleep    Referring provider: Katrinka Garnette KIDD, MD  HPI: 58 year old male seen for sleep consult June 30, 2024 for snoring and witnessed apneic events  TEST/EVENTS :   06/30/2024 Sleep consult  Discussed the use of AI scribe software for clinical note transcription with the patient, who gave verbal consent to proceed.  History of Present Illness Aaron Gomez is a 58 year old male who presents with new onset sleep apnea symptoms. He was kindly referred by Dr. Katrinka for evaluation of sleep apnea symptoms.  He has a history of snoring and underwent nasal surgery for a deviated septum, which initially decreased his snoring. Over the past six months, he has developed new symptoms indicative of sleep apnea, including episodes of stopped breathing lasting 15-20 seconds and obstructive breathing struggles during sleep. He also wakes up with a dry and cracked tongue. Spouse is concerned with new episodes of apnea during his sleep.  He takes amitriptyline  at night to manage migraine headaches, which occur three to four times a week without medication. With amitriptyline , the frequency of migraines has reduced to about one or two every week or two, and the severity has decreased. He reports sleeping well and waking up feeling refreshed, without daytime sleepiness.  No symptoms suspicious for cataplexy or sleep paralysis.  Typically goes to bed about 9 PM.  Takes 5 to 10 minutes to go to sleep.  Gets up between 7 and 9 AM.  Weight is up about 5 pounds over the last 2 years.  Current weight is at 206 pounds with a BMI of 27     06/30/2024   10:00 AM  Results of the Epworth flowsheet  Sitting and reading 0  Watching TV 0  Sitting, inactive in a public place (e.g. a theatre or a meeting) 0  As a passenger in a car for an hour without a break 0  Lying down  to rest in the afternoon when circumstances permit 1  Sitting and talking to someone 0  Sitting quietly after a lunch without alcohol 0  In a car, while stopped for a few minutes in traffic 0  Total score 1     His past medical history includes prostate cancer, allergic asthma, hypothyroidism for which he takes Synthroid , and a history of knee issues that required surgery. . He has a history of high blood pressure noted recently during a visit for blood donation appointment.  Socially, he is retired from [employer], married, and has three adult children. He consumes alcohol socially and does not smoke. He drinks two cups of caffeine daily.  Family history includes a paternal history of heart attack at age 27 and issues with snoring, but no known family history of sleep apnea.    Allergies  Allergen Reactions   Banana Anaphylaxis   Bee Venom Anaphylaxis    Also venom from wasps & hornets cause anaphylaxis; he carries Epi-Pen & has Medi Alert bracelet   Neomycin Sulfate Anaphylaxis    Anaphylactic shock  Because of a history of documented adverse serious drug reaction;Medi Alert bracelet  is recommended   Strawberry Extract Anaphylaxis   Tape Anaphylaxis    adhesvie tape-rash, blisters   Venomil Mixed Vespid  [Mixed Vespid Venom] Anaphylaxis   Neosporin [Bacitracin-Polymyxin B]    Percocet [Oxycodone -Acetaminophen ]  Nausea Only    Post surgery    Immunization History  Administered Date(s) Administered   Influenza,inj,Quad PF,6+ Mos 08/17/2013, 08/02/2019, 08/03/2020, 09/18/2022   Influenza-Unspecified 11/02/2015, 08/22/2016, 08/31/2017, 08/30/2018, 08/05/2021   PFIZER Comirnaty(Gray Top)Covid-19 Tri-Sucrose Vaccine 03/14/2021   PFIZER(Purple Top)SARS-COV-2 Vaccination 02/07/2020, 02/28/2020, 09/01/2020   Pfizer Covid-19 Vaccine Bivalent Booster 93yrs & up 09/25/2021   Td 11/17/2001   Tdap 02/02/2012, 12/23/2022   Zoster Recombinant(Shingrix ) 07/29/2018, 10/12/2018     Past Medical History:  Diagnosis Date   Allergy    venom anaphylaxis   Arthritis 2023   Patellofemoral Chondrosis Rt Knee   Asthma    extrinsic   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   Complication of anesthesia    slow to wake up    Family history of breast cancer    Family history of melanoma    Family history of parotid cancer    GERD (gastroesophageal reflux disease)    Headache(784.0)    hx of migraines    History of kidney stones    Hyperlipidemia    Hypothyroidism    Nephrolithiasis     X 8,Dr Tannebaum   PONV (postoperative nausea and vomiting)    Prostate cancer (HCC)    Tuberculosis    hx of ox 1     Tobacco History: Social History   Tobacco Use  Smoking Status Former   Current packs/day: 0.00   Average packs/day: 0.5 packs/day for 3.0 years (1.5 ttl pk-yrs)   Types: Cigarettes   Start date: 11/18/1983   Quit date: 11/17/1986   Years since quitting: 37.6  Smokeless Tobacco Never  Tobacco Comments   smoked 1980-1988, up to 1 ppd   Counseling given: Not Answered Tobacco comments: smoked 1980-1988, up to 1 ppd   Outpatient Medications Prior to Visit  Medication Sig Dispense Refill   albuterol  (VENTOLIN  HFA) 108 (90 Base) MCG/ACT inhaler Inhale 2 puffs into the lungs every 4 to 6 hours as needed for cough/wheeze. 6.7 g 0   amitriptyline  (ELAVIL ) 10 MG tablet Take 1 tablet (10 mg total) by mouth at bedtime. 90 tablet 1   ASPIRIN  81 PO Take 81 mg by mouth daily.      aspirin  EC (ASPIRIN  LOW DOSE) 81 MG tablet Take 1 tablet (81 mg total) by mouth daily for three weeks following surgery 21 tablet 0   atorvastatin  (LIPITOR) 20 MG tablet Take 1 tablet (20 mg total) by mouth daily. 90 tablet 3   budesonide -formoterol  (SYMBICORT ) 80-4.5 MCG/ACT inhaler Inhale 2 puffs by mouth twice daily. 10.2 g 5   celecoxib  (CELEBREX ) 200 MG capsule Take 1 capsule (200 mg total) by mouth 2 (two) times daily. 30 capsule 1   EPINEPHrine  0.3 mg/0.3 mL IJ SOAJ injection Inject as  directed. 2 each 1   famotidine  (PEPCID ) 20 MG tablet Take 20 mg by mouth daily.     fexofenadine (ALLEGRA) 180 MG tablet Take 180 mg by mouth daily.      fluticasone  (FLONASE ) 50 MCG/ACT nasal spray Place 1 spray into the nose at bedtime.      HYDROcodone -acetaminophen  (NORCO/VICODIN) 5-325 MG tablet Take 1 tablet by mouth every 6 (six) hours as needed for pain 20 tablet 0   Ketorolac  Tromethamine  15.75 MG/SPRAY SOLN One spray (15.75 mg) in 1 nostril (total dose: 15.75 mg) every 6 to 8 hours; maximum dose: 4 doses (63 mg)/day 1 each 5   levothyroxine  (SYNTHROID ) 112 MCG tablet Take 1 tablet (112 mcg total) by mouth daily  before breakfast. 90 tablet 3   methocarbamol  (ROBAXIN ) 500 MG tablet Take 1 tablet (500 mg total) by mouth every 6 (six) hours as needed for muscle spasms 40 tablet 0   ondansetron  (ZOFRAN ) 4 MG tablet Take 1 tablet (4 mg total) by mouth every 6 (six) hours as needed for nausea. 20 tablet 0   rizatriptan  (MAXALT -MLT) 10 MG disintegrating tablet DISSOLVE 1 TABLET IN MOUTH ONCE DAILY AS NEEDED FOR MIGRAINE. 12 tablet 5   Spacer/Aero-Holding Chambers (OPTICHAMBER DIAMOND ) MISC Use as directed 1 each 1   No facility-administered medications prior to visit.     Review of Systems:   Constitutional:   No  weight loss, night sweats,  Fevers, chills, fatigue, or  lassitude.  HEENT:   No headaches,  Difficulty swallowing,  Tooth/dental problems, or  Sore throat,                No sneezing, itching, ear ache, nasal congestion, post nasal drip,   CV:  No chest pain,  Orthopnea, PND, swelling in lower extremities, anasarca, dizziness, palpitations, syncope.   GI  No heartburn, indigestion, abdominal pain, nausea, vomiting, diarrhea, change in bowel habits, loss of appetite, bloody stools.   Resp: No shortness of breath with exertion or at rest.  No excess mucus, no productive cough,  No non-productive cough,  No coughing up of blood.  No change in color of mucus.  No wheezing.  No  chest wall deformity  Skin: no rash or lesions.  GU: no dysuria, change in color of urine, no urgency or frequency.  No flank pain, no hematuria   MS:  No joint pain or swelling.  No decreased range of motion.  No back pain.    Physical Exam  Ht 6' (1.829 m)   Wt 206 lb (93.4 kg)   BMI 27.94 kg/m   GEN: A/Ox3; pleasant , NAD, well nourished    HEENT:  [place]/AT,    NOSE-clear, THROAT-clear, no lesions, no postnasal drip or exudate noted.  Class III MP airway  NECK:  Supple w/ fair ROM; no JVD; normal carotid impulses w/o bruits; no thyromegaly or nodules palpated; no lymphadenopathy.    RESP  Clear  P & A; w/o, wheezes/ rales/ or rhonchi. no accessory muscle use, no dullness to percussion  CARD:  RRR, no m/r/g, no peripheral edema, pulses intact, no cyanosis or clubbing.  GI:   Soft & nt; nml bowel sounds; no organomegaly or masses detected.   Musco: Warm bil, no deformities or joint swelling noted.   Neuro: alert, no focal deficits noted.    Skin: Warm, no lesions or rashes    Lab Results:  CBC    Component Value Date/Time   WBC 9.9 12/28/2023 1000   RBC 5.15 12/28/2023 1000   HGB 16.2 12/28/2023 1000   HCT 47.7 12/28/2023 1000   PLT 288.0 12/28/2023 1000   MCV 92.6 12/28/2023 1000   MCH 30.3 08/03/2020 1054   MCHC 33.9 12/28/2023 1000   RDW 12.7 12/28/2023 1000   LYMPHSABS 2.1 12/28/2023 1000   MONOABS 0.9 12/28/2023 1000   EOSABS 0.6 12/28/2023 1000   BASOSABS 0.1 12/28/2023 1000    BMET    Component Value Date/Time   NA 137 02/08/2024 0912   K 4.1 02/08/2024 0912   CL 102 02/08/2024 0912   CO2 28 02/08/2024 0912   GLUCOSE 101 (H) 02/08/2024 0912   BUN 14 02/08/2024 0912   CREATININE 1.12 02/08/2024 0912   CREATININE 1.08  08/03/2020 1054   CALCIUM  9.4 02/08/2024 0912   GFRNONAA 77 08/03/2020 1054   GFRAA 90 08/03/2020 1054    BNP No results found for: BNP  ProBNP No results found for: PROBNP  Imaging: No results  found.  Administration History     None           No data to display          No results found for: NITRICOXIDE      Assessment & Plan:   No problem-specific Assessment & Plan notes found for this encounter.   Assessment and Plan Assessment & Plan Suspected obstructive sleep apnea   He experiences snoring and witnessed apneic events over the past six months, with dry mouth upon waking.  Has had chronic snoring for years no daytime sleepiness is reported Untreated sleep apnea may lead to cardiovascular and metabolic issues, low oxygen levels, memory issues, heart arrhythmias, and systemic problems like diabetes. Order a home sleep study through [facility]. Schedule a follow-up visit in six weeks to review sleep study results. .- discussed how weight can impact sleep and risk for sleep disordered breathing - discussed options to assist with weight loss: combination of diet modification, cardiovascular and strength training exercises   - had an extensive discussion regarding the adverse health consequences related to untreated sleep disordered breathing - specifically discussed the risks for hypertension, coronary artery disease, cardiac dysrhythmias, cerebrovascular disease, and diabetes - lifestyle modification discussed   - discussed how sleep disruption can increase risk of accidents, particularly when driving - safe driving practices were discussed     Migraine headaches   Migraine headaches occur one or two times every week or two, reduced from three to four times per week with amitriptyline , decreasing severity.  Keep follow-up with primary care  Hypertension   Recent onset of elevated blood pressure, possibly connected to suspected obstructive sleep apnea.  Continue follow-up with primary care    Madelin Stank, NP 06/30/2024

## 2024-06-30 NOTE — Patient Instructions (Signed)
 Set up for home sleep study  Work on healthy weight  Do not drive if sleepy  Use caution with sedating medications  Follow up in 6 weeks to discuss results and treatment plan

## 2024-07-04 ENCOUNTER — Ambulatory Visit: Payer: Self-pay | Admitting: Physician Assistant

## 2024-07-04 ENCOUNTER — Encounter: Payer: Self-pay | Admitting: Family Medicine

## 2024-07-04 ENCOUNTER — Ambulatory Visit: Admitting: Physician Assistant

## 2024-07-04 ENCOUNTER — Other Ambulatory Visit (HOSPITAL_BASED_OUTPATIENT_CLINIC_OR_DEPARTMENT_OTHER): Payer: Self-pay

## 2024-07-04 ENCOUNTER — Encounter: Payer: Self-pay | Admitting: Physician Assistant

## 2024-07-04 VITALS — BP 150/102 | HR 57 | Temp 98.2°F | Ht 72.0 in | Wt 207.4 lb

## 2024-07-04 DIAGNOSIS — R7989 Other specified abnormal findings of blood chemistry: Secondary | ICD-10-CM

## 2024-07-04 DIAGNOSIS — E0590 Thyrotoxicosis, unspecified without thyrotoxic crisis or storm: Secondary | ICD-10-CM | POA: Diagnosis not present

## 2024-07-04 DIAGNOSIS — I10 Essential (primary) hypertension: Secondary | ICD-10-CM | POA: Diagnosis not present

## 2024-07-04 DIAGNOSIS — R7401 Elevation of levels of liver transaminase levels: Secondary | ICD-10-CM

## 2024-07-04 DIAGNOSIS — G43009 Migraine without aura, not intractable, without status migrainosus: Secondary | ICD-10-CM

## 2024-07-04 DIAGNOSIS — R29818 Other symptoms and signs involving the nervous system: Secondary | ICD-10-CM

## 2024-07-04 LAB — COMPREHENSIVE METABOLIC PANEL WITH GFR
ALT: 76 U/L — ABNORMAL HIGH (ref 0–53)
AST: 41 U/L — ABNORMAL HIGH (ref 0–37)
Albumin: 4.5 g/dL (ref 3.5–5.2)
Alkaline Phosphatase: 75 U/L (ref 39–117)
BUN: 14 mg/dL (ref 6–23)
CO2: 31 meq/L (ref 19–32)
Calcium: 9.7 mg/dL (ref 8.4–10.5)
Chloride: 99 meq/L (ref 96–112)
Creatinine, Ser: 1.07 mg/dL (ref 0.40–1.50)
GFR: 76.81 mL/min (ref >60.00)
Glucose, Bld: 100 mg/dL — ABNORMAL HIGH (ref 70–99)
Potassium: 4.2 meq/L (ref 3.5–5.1)
Sodium: 138 meq/L (ref 135–145)
Total Bilirubin: 0.6 mg/dL (ref 0.2–1.2)
Total Protein: 7.1 g/dL (ref 6.0–8.3)

## 2024-07-04 MED ORDER — AMLODIPINE BESYLATE 5 MG PO TABS
5.0000 mg | ORAL_TABLET | Freq: Every day | ORAL | 1 refills | Status: DC
  Filled 2024-07-04: qty 90, 90d supply, fill #0
  Filled 2024-09-24: qty 90, 90d supply, fill #1

## 2024-07-04 NOTE — Patient Instructions (Addendum)
 It was great to see you!  Start 5 mg amlodipine  and send us  blood pressure readings in 1 month or so  Your blood pressure is elevated in our office today.  I recommend that you monitor this at home.  Your goal blood pressure should be around < 130/80, unless you are over 59 years old, your goal may be closer to 140-150/90. Please note if you have been given other goals from a cardiologist or other healthcare provider, please defer to their recommendations.  When preparing to take your blood pressure: Plan ahead. Don't smoke, drink caffeine or exercise within 30 minutes before taking your blood pressure. Empty your bladder. Don't take the measurement over clothes. Remove the clothing over the arm that will be used to measure blood pressure. You can use either arm unless otherwise told by a healthcare provider. Usually there is not a big difference between readings on them. Be still. Allow at least five minutes of quiet rest before measurements. Don't talk or use the phone. Sit correctly. Sit with your back straight and supported (on a dining chair, rather than a sofa). Your feet should be flat on the floor. Do not cross your legs. Support your arm on a flat surface. The middle of the cuff should be placed on the upper arm at heart level.  Measure at the same time of the day. Take multiple readings and record the results. Each time you measure, take two readings one minute apart. Record the results and bring in to your next office visit.  In order to know how well the medication is working, I would like you to take your readings 1-2 hours after taking your blood pressure medication if possible. Take your blood pressure measurements and record 2-3 days per week.  If you get a high blood pressure reading: A single high reading is not an immediate cause for alarm. If you get a reading that is higher than normal, take your blood pressure a second time. Write down the results of both measurements.  Check with your health care professional to see if there's a health concern or whether there may be problems with your monitor. If your blood pressure readings are suddenly higher than 180/120 mm Hg, wait at least one minute and test again. If your readings are still very high, contact your health care professional immediately. You could be having a hypertensive crisis. Call 911 if your blood pressure is higher than 180/120 mm Hg and if you are having new signs or symptoms that may include: Chest pain Shortness of breath Back pain Numbness Weakness Change in vision Difficulty speaking Confusion Dizziness Vomiting

## 2024-07-04 NOTE — Progress Notes (Signed)
 Aaron Gomez is a 58 y.o. male here for a follow up of a pre-existing problem.  History of Present Illness:   Chief Complaint  Patient presents with   c/o elevated blood pressure    Pt has been having elevated blood pressure for the past few weeks, has been checking blood pressure daily, systolic 146-154, diastolic 107-108. This morning was 164/109. Pt says he gets migraines,but not anything abnormal. Denies dizziness or blurred vision, only with migraines.    Discussed the use of AI scribe software for clinical note transcription with the patient, who gave verbal consent to proceed.  History of Present Illness Aaron Gomez is a 58 year old male with hypertension who presents with elevated blood pressure readings. He is accompanied by his wife.  He has experienced elevated blood pressure readings since July, initially noted during a blood donation attempt with readings of 146/100 mmHg and 131/98 mmHg. Home monitoring consistently shows high readings. A recent pulmonology appointment also noted significant elevation.  He is being evaluated for suspected sleep apnea, experiencing snoring, dry mouth upon waking, and gasping episodes during sleep. A home sleep study is pending.  He takes amitriptyline  10 mg daily for migraines, which has helped to reduce frequency to about four times a month. He uses Maxalt , Tylenol , and Sprix  for acute management.  Knee surgery two and a half months ago has limited his physical activity. He now walks four to five times a week instead of running.  Dietary habits include moderate caffeine intake, limited alcohol consumption, and minimal salt use. Family history includes hypertension and early cardiac events on the paternal side. He has had a calcium  score test last year that was 0.  He manages hyperthyroidism with levothyroxine , with normal thyroid  levels last checked in March. He previously took atorvastatin  but stopped due to elevated liver enzymes, with  ongoing liver function monitoring.    Past Medical History:  Diagnosis Date   Allergy    venom anaphylaxis   Arthritis 2023   Patellofemoral Chondrosis Rt Knee   Asthma    extrinsic   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   Complication of anesthesia    slow to wake up    Family history of breast cancer    Family history of melanoma    Family history of parotid cancer    GERD (gastroesophageal reflux disease)    Headache(784.0)    hx of migraines    History of kidney stones    Hyperlipidemia    Hypothyroidism    Nephrolithiasis     X 8,Dr Tannebaum   PONV (postoperative nausea and vomiting)    Prostate cancer (HCC)    Tuberculosis    hx of ox 1      Social History   Tobacco Use   Smoking status: Former    Current packs/day: 0.00    Average packs/day: 0.5 packs/day for 3.0 years (1.5 ttl pk-yrs)    Types: Cigarettes    Start date: 11/18/1983    Quit date: 11/17/1986    Years since quitting: 37.6   Smokeless tobacco: Never   Tobacco comments:    smoked 1980-1988, up to 1 ppd  Vaping Use   Vaping status: Never Used  Substance Use Topics   Alcohol use: Yes    Alcohol/week: 2.0 standard drinks of alcohol    Types: 2 Glasses of wine per week    Comment: periodic    Drug use: No    Past Surgical History:  Procedure Laterality Date   CERVICAL SPINE SURGERY  11/2019   Dr. Louis, disc replacements   COLONOSCOPY  2017   ELBOW SURGERY     [facility]   FRACTURE SURGERY  2003   L4/L5/S1 double fusion   KNEE ARTHROSCOPY  2011   R knee; Dr Hiram   LUMBAR FUSION  2004   Dr Malcolm   LYMPHADENECTOMY Bilateral 03/25/2018   Procedure: LYMPHADENECTOMY, PELVIC;  Surgeon: Renda Glance, MD;  Location: [facility];  Service: Urology;  Laterality: Bilateral;   MICRODISCECTOMY LUMBAR  1999   L4-5,L5-S1;Dr. Poole   NASAL SEPTOPLASTY W/ TURBINOPLASTY     ROBOT ASSISTED LAPAROSCOPIC RADICAL PROSTATECTOMY N/A 03/25/2018   Procedure: XI ROBOTIC ASSISTED LAPAROSCOPIC RADICAL PROSTATECTOMY  LEVEL 2;  Surgeon: Renda Glance, MD;  Location: [facility];  Service: Urology;  Laterality: N/A;   SPINE SURGERY  2021   C5/C6/C7 Disc Replacement   TONSILLECTOMY     VASECTOMY  2000    Family History  Problem Relation Age of Onset   Heart attack Father 87       died @ 70   Heart disease Father    Thyroid  disease Mother    Cancer Mother    Heart attack Brother 29       1/2 brother   Crohn's disease Brother    Hypertension Paternal Grandmother    Heart disease Paternal Grandmother        MI in 16s   Diabetes Paternal Grandmother    Heart attack Paternal Grandfather 20   Heart disease Paternal Grandfather    Heart attack Paternal Aunt 14   Breast cancer Sister 52   Cancer Sister    Melanoma Maternal Aunt    Cancer Maternal Grandfather        parotid mixed tumor- met lung   Colon cancer Neg Hx    Esophageal cancer Neg Hx    Pancreatic cancer Neg Hx    Prostate cancer Neg Hx    Rectal cancer Neg Hx    Stomach cancer Neg Hx     Allergies  Allergen Reactions   Banana Anaphylaxis   Bee Venom Anaphylaxis    Also venom from wasps & hornets cause anaphylaxis; he carries Epi-Pen & has Medi Alert bracelet   Neomycin Sulfate Anaphylaxis    Anaphylactic shock  Because of a history of documented adverse serious drug reaction;Medi Alert bracelet  is recommended   Strawberry Extract Anaphylaxis   Tape Anaphylaxis    adhesvie tape-rash, blisters   Venomil Mixed Vespid  [Mixed Vespid Venom] Anaphylaxis   Neosporin [Bacitracin-Polymyxin B]    Percocet [Oxycodone -Acetaminophen ] Nausea Only    Post surgery    Current Medications:   Current Outpatient Medications:    albuterol  (VENTOLIN  HFA) 108 (90 Base) MCG/ACT inhaler, Inhale 2 puffs into the lungs every 4 to 6 hours as needed for cough/wheeze., Disp: 6.7 g, Rfl: 0   amitriptyline  (ELAVIL ) 10 MG tablet, Take 1 tablet (10 mg total) by mouth at bedtime., Disp: 90 tablet, Rfl: 1   amLODipine  (NORVASC ) 5 MG tablet, Take 1 tablet (5  mg total) by mouth daily., Disp: 90 tablet, Rfl: 1   aspirin  EC (ASPIRIN  LOW DOSE) 81 MG tablet, Take 1 tablet (81 mg total) by mouth daily for three weeks following surgery, Disp: 21 tablet, Rfl: 0   budesonide -formoterol  (SYMBICORT ) 80-4.5 MCG/ACT inhaler, Inhale 2 puffs by mouth twice daily., Disp: 10.2 g, Rfl: 5   EPINEPHrine  0.3 mg/0.3 mL IJ SOAJ injection, Inject as directed.,  Disp: 2 each, Rfl: 1   famotidine  (PEPCID ) 20 MG tablet, Take 20 mg by mouth daily., Disp: , Rfl:    fexofenadine (ALLEGRA) 180 MG tablet, Take 180 mg by mouth daily. , Disp: , Rfl:    fluticasone  (FLONASE ) 50 MCG/ACT nasal spray, Place 1 spray into the nose at bedtime.  (Patient taking differently: Place 1 spray into the nose as needed.), Disp: , Rfl:    HYDROcodone -acetaminophen  (NORCO/VICODIN) 5-325 MG tablet, Take 1 tablet by mouth every 6 (six) hours as needed for pain, Disp: 20 tablet, Rfl: 0   Ketorolac  Tromethamine  15.75 MG/SPRAY SOLN, One spray (15.75 mg) in 1 nostril (total dose: 15.75 mg) every 6 to 8 hours; maximum dose: 4 doses (63 mg)/day, Disp: 1 each, Rfl: 5   levothyroxine  (SYNTHROID ) 112 MCG tablet, Take 1 tablet (112 mcg total) by mouth daily before breakfast., Disp: 90 tablet, Rfl: 3   methocarbamol  (ROBAXIN ) 500 MG tablet, Take 1 tablet (500 mg total) by mouth every 6 (six) hours as needed for muscle spasms, Disp: 40 tablet, Rfl: 0   ondansetron  (ZOFRAN ) 4 MG tablet, Take 1 tablet (4 mg total) by mouth every 6 (six) hours as needed for nausea., Disp: 20 tablet, Rfl: 0   rizatriptan  (MAXALT -MLT) 10 MG disintegrating tablet, DISSOLVE 1 TABLET IN MOUTH ONCE DAILY AS NEEDED FOR MIGRAINE., Disp: 12 tablet, Rfl: 5   Spacer/Aero-Holding Chambers (OPTICHAMBER DIAMOND ) MISC, Use as directed, Disp: 1 each, Rfl: 1   Review of Systems:   Negative unless otherwise specified per HPI.  Vitals:   Vitals:   07/04/24 1132 07/04/24 1228  BP: (!) 146/100 (!) 150/102  Pulse: (!) 57   Temp: 98.2 F (36.8 C)    TempSrc: Temporal   SpO2: 97%   Weight: 207 lb 6.1 oz (94.1 kg)   Height: 6' (1.829 m)      Body mass index is 28.13 kg/m.  Physical Exam:   Physical Exam Vitals and nursing note reviewed.  Constitutional:      General: He is not in acute distress.    Appearance: He is well-developed. He is not ill-appearing or toxic-appearing.  Cardiovascular:     Rate and Rhythm: Normal rate and regular rhythm.     Pulses: Normal pulses.     Heart sounds: Normal heart sounds, S1 normal and S2 normal.  Pulmonary:     Effort: Pulmonary effort is normal.     Breath sounds: Normal breath sounds.  Skin:    General: Skin is warm and dry.  Neurological:     Mental Status: He is alert.     GCS: GCS eye subscore is 4. GCS verbal subscore is 5. GCS motor subscore is 6.  Psychiatric:        Speech: Speech normal.        Behavior: Behavior normal. Behavior is cooperative.     Assessment and Plan:   Assessment and Plan Assessment & Plan Hypertension Hypertension with elevated readings, possibly exacerbated by untreated sleep apnea and reduced physical activity post-knee surgery. Family history of early cardiac events noted. - Prescribed amlodipine  5 mg daily. If lower extremity edema -- asked him to let us  know. - Monitor blood pressure at home and report readings in 1-2 months. - Continue evaluation and treatment for suspected sleep apnea.  Suspected obstructive sleep apnea Symptoms suggestive of obstructive sleep apnea. Home sleep study pending. - Complete home sleep study. - Follow up with results and potential CPAP initiation.  Migraine, improved on amitriptyline  Migraines reduced  in frequency and severity with amitriptyline . - Continue amitriptyline  10 mg daily. - Use Maxalt , Tylenol , and Sprix  as needed for acute migraine management.  Hyperthyroidism, well controlled Hyperthyroidism well controlled with levothyroxine . Last TSH normal. - Continue current dose of levothyroxine . - No  immediate need for TSH recheck unless symptoms change.  Elevated liver enzymes, monitoring Previous liver enzyme elevation possibly related to atorvastatin . Potential hepatic steatosis discussed. - Order liver function tests today. - Review results and consider further evaluation if enzymes remain elevated.     Lucie Buttner, PA-C

## 2024-07-07 ENCOUNTER — Ambulatory Visit
Admission: RE | Admit: 2024-07-07 | Discharge: 2024-07-07 | Disposition: A | Discharge status: Home / Self Care | Source: Ambulatory Visit | Attending: Family Medicine | Admitting: Family Medicine

## 2024-07-07 DIAGNOSIS — R7401 Elevation of levels of liver transaminase levels: Secondary | ICD-10-CM

## 2024-07-07 DIAGNOSIS — N281 Cyst of kidney, acquired: Secondary | ICD-10-CM | POA: Diagnosis not present

## 2024-07-13 ENCOUNTER — Ambulatory Visit: Payer: Self-pay | Admitting: Family Medicine

## 2024-07-14 NOTE — Progress Notes (Signed)
Results seen in my chart 

## 2024-07-19 ENCOUNTER — Encounter

## 2024-07-19 DIAGNOSIS — J301 Allergic rhinitis due to pollen: Secondary | ICD-10-CM | POA: Diagnosis not present

## 2024-07-19 DIAGNOSIS — J3089 Other allergic rhinitis: Secondary | ICD-10-CM | POA: Diagnosis not present

## 2024-07-19 DIAGNOSIS — R0683 Snoring: Secondary | ICD-10-CM

## 2024-07-21 DIAGNOSIS — T63451D Toxic effect of venom of hornets, accidental (unintentional), subsequent encounter: Secondary | ICD-10-CM | POA: Diagnosis not present

## 2024-07-21 DIAGNOSIS — T63441D Toxic effect of venom of bees, accidental (unintentional), subsequent encounter: Secondary | ICD-10-CM | POA: Diagnosis not present

## 2024-07-21 DIAGNOSIS — T63461D Toxic effect of venom of wasps, accidental (unintentional), subsequent encounter: Secondary | ICD-10-CM | POA: Diagnosis not present

## 2024-07-29 DIAGNOSIS — G4733 Obstructive sleep apnea (adult) (pediatric): Secondary | ICD-10-CM | POA: Diagnosis not present

## 2024-08-05 DIAGNOSIS — J3089 Other allergic rhinitis: Secondary | ICD-10-CM | POA: Diagnosis not present

## 2024-08-05 DIAGNOSIS — J301 Allergic rhinitis due to pollen: Secondary | ICD-10-CM | POA: Diagnosis not present

## 2024-08-05 DIAGNOSIS — J3081 Allergic rhinitis due to animal (cat) (dog) hair and dander: Secondary | ICD-10-CM | POA: Diagnosis not present

## 2024-08-23 ENCOUNTER — Ambulatory Visit: Admitting: Adult Health

## 2024-08-26 ENCOUNTER — Ambulatory Visit: Payer: Self-pay | Admitting: Adult Health

## 2024-08-29 DIAGNOSIS — M25512 Pain in left shoulder: Secondary | ICD-10-CM | POA: Diagnosis not present

## 2024-08-29 DIAGNOSIS — J3089 Other allergic rhinitis: Secondary | ICD-10-CM | POA: Diagnosis not present

## 2024-08-29 DIAGNOSIS — J3081 Allergic rhinitis due to animal (cat) (dog) hair and dander: Secondary | ICD-10-CM | POA: Diagnosis not present

## 2024-08-29 DIAGNOSIS — J301 Allergic rhinitis due to pollen: Secondary | ICD-10-CM | POA: Diagnosis not present

## 2024-08-29 NOTE — Progress Notes (Signed)
 Called and spoke to pt - advised of HST results per Tammy Parrett. Moved pt's appt to 09/15/24 at 1:30, pt is unavailable earlier due to travel. Pt verbalized understanding, NFN.

## 2024-09-02 DIAGNOSIS — T63461D Toxic effect of venom of wasps, accidental (unintentional), subsequent encounter: Secondary | ICD-10-CM | POA: Diagnosis not present

## 2024-09-02 DIAGNOSIS — T63441D Toxic effect of venom of bees, accidental (unintentional), subsequent encounter: Secondary | ICD-10-CM | POA: Diagnosis not present

## 2024-09-02 DIAGNOSIS — T63451D Toxic effect of venom of hornets, accidental (unintentional), subsequent encounter: Secondary | ICD-10-CM | POA: Diagnosis not present

## 2024-09-06 DIAGNOSIS — J301 Allergic rhinitis due to pollen: Secondary | ICD-10-CM | POA: Diagnosis not present

## 2024-09-06 DIAGNOSIS — J3089 Other allergic rhinitis: Secondary | ICD-10-CM | POA: Diagnosis not present

## 2024-09-08 DIAGNOSIS — T63451D Toxic effect of venom of hornets, accidental (unintentional), subsequent encounter: Secondary | ICD-10-CM | POA: Diagnosis not present

## 2024-09-08 DIAGNOSIS — T63441D Toxic effect of venom of bees, accidental (unintentional), subsequent encounter: Secondary | ICD-10-CM | POA: Diagnosis not present

## 2024-09-08 DIAGNOSIS — T63461D Toxic effect of venom of wasps, accidental (unintentional), subsequent encounter: Secondary | ICD-10-CM | POA: Diagnosis not present

## 2024-09-12 DIAGNOSIS — J301 Allergic rhinitis due to pollen: Secondary | ICD-10-CM | POA: Diagnosis not present

## 2024-09-12 DIAGNOSIS — H524 Presbyopia: Secondary | ICD-10-CM | POA: Diagnosis not present

## 2024-09-12 DIAGNOSIS — J3089 Other allergic rhinitis: Secondary | ICD-10-CM | POA: Diagnosis not present

## 2024-09-12 DIAGNOSIS — J3081 Allergic rhinitis due to animal (cat) (dog) hair and dander: Secondary | ICD-10-CM | POA: Diagnosis not present

## 2024-09-12 DIAGNOSIS — M25512 Pain in left shoulder: Secondary | ICD-10-CM | POA: Diagnosis not present

## 2024-09-15 ENCOUNTER — Encounter: Payer: Self-pay | Admitting: Adult Health

## 2024-09-15 ENCOUNTER — Ambulatory Visit: Admitting: Adult Health

## 2024-09-15 VITALS — BP 132/79 | HR 80 | Temp 98.0°F | Ht 72.0 in | Wt 206.8 lb

## 2024-09-15 DIAGNOSIS — G4733 Obstructive sleep apnea (adult) (pediatric): Secondary | ICD-10-CM | POA: Diagnosis not present

## 2024-09-15 DIAGNOSIS — T63461D Toxic effect of venom of wasps, accidental (unintentional), subsequent encounter: Secondary | ICD-10-CM | POA: Diagnosis not present

## 2024-09-15 DIAGNOSIS — J309 Allergic rhinitis, unspecified: Secondary | ICD-10-CM

## 2024-09-15 DIAGNOSIS — I10 Essential (primary) hypertension: Secondary | ICD-10-CM | POA: Diagnosis not present

## 2024-09-15 DIAGNOSIS — Z6828 Body mass index (BMI) 28.0-28.9, adult: Secondary | ICD-10-CM

## 2024-09-15 DIAGNOSIS — T63451D Toxic effect of venom of hornets, accidental (unintentional), subsequent encounter: Secondary | ICD-10-CM | POA: Diagnosis not present

## 2024-09-15 NOTE — Progress Notes (Signed)
 @Patient  ID: Aaron Gomez, male    DOB: 1965-12-30, 58 y.o.   MRN: [id number]  Chief Complaint  Patient presents with   Obstructive Sleep Apnea    Sleep teset f/u    Referring provider: Katrinka Garnette KIDD, MD  HPI: 58 year old male seen for sleep consult June 30, 2024 for snoring and witnessed apneic events found to have severe obstructive sleep apnea    TEST/EVENTS : Reviewed 09/15/2024  Home sleep study July 19, 2024 AHI 42.0/hour SpO2 low at 83%  Discussed the use of AI scribe software for clinical note transcription with the patient, who gave verbal consent to proceed.  History of Present Illness Aaron Gomez is a 58 year old male with severe sleep apnea who presents for evaluation and management of his condition.  A recent sleep study showed AHI 42/hour and SpO2 low at 83%, spent 15 minutes below 88% during sleep study.  Patient has significant symptom burden with loud snoring, severe dry mouth, and daytime fatigue.   He wakes up with a dry mouth, which he attributes to mouth breathing during sleep. He has a history of headaches and nasal congestion, which he believes may contribute to his sleep issues. He has previously undergone sinus surgery but continues to experience nasal congestion and loud snoring.SABRA   He has been monitoring his blood pressure, which has been elevated at home. He was recently started on medication by his primary care provider, but the blood pressure remains high.   He travels frequently for work and expresses concern about the portability of CPAP equipment.     Allergies  Allergen Reactions   Banana Anaphylaxis   Bee Venom Anaphylaxis    Also venom from wasps & hornets cause anaphylaxis; he carries Epi-Pen & has Medi Alert bracelet   Neomycin Sulfate Anaphylaxis    Anaphylactic shock  Because of a history of documented adverse serious drug reaction;Medi Alert bracelet  is recommended   Strawberry Extract Anaphylaxis   Tape Anaphylaxis     adhesvie tape-rash, blisters   Venomil Mixed Vespid  [Mixed Vespid Venom] Anaphylaxis   Neosporin [Bacitracin-Polymyxin B]    Percocet [Oxycodone -Acetaminophen ] Nausea Only    Post surgery    Immunization History  Administered Date(s) Administered   Influenza,inj,Quad PF,6+ Mos 08/17/2013, 08/02/2019, 08/03/2020, 09/18/2022   Influenza-Unspecified 11/02/2015, 08/22/2016, 08/31/2017, 08/30/2018, 08/05/2021   PFIZER Comirnaty(Gray Top)Covid-19 Tri-Sucrose Vaccine 03/14/2021   PFIZER(Purple Top)SARS-COV-2 Vaccination 02/07/2020, 02/28/2020, 09/01/2020   Pfizer Covid-19 Vaccine Bivalent Booster 19yrs & up 09/25/2021   Td 11/17/2001   Tdap 02/02/2012, 12/23/2022   Zoster Recombinant(Shingrix ) 07/29/2018, 10/12/2018    Past Medical History:  Diagnosis Date   Allergy    venom anaphylaxis   Arthritis 2023   Patellofemoral Chondrosis Rt Knee   Asthma    extrinsic   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   Complication of anesthesia    slow to wake up    Family history of breast cancer    Family history of melanoma    Family history of parotid cancer    GERD (gastroesophageal reflux disease)    Headache(784.0)    hx of migraines    History of kidney stones    Hyperlipidemia    Hypothyroidism    Nephrolithiasis     X 8,Dr Tannebaum   PONV (postoperative nausea and vomiting)    Prostate cancer (HCC)    Tuberculosis    hx of ox 1     Tobacco History: Social History  Tobacco Use  Smoking Status Former   Current packs/day: 0.00   Average packs/day: 0.5 packs/day for 3.0 years (1.5 ttl pk-yrs)   Types: Cigarettes   Start date: 11/18/1983   Quit date: 11/17/1986   Years since quitting: 37.8  Smokeless Tobacco Never  Tobacco Comments   smoked 1980-1988, up to 1 ppd   Counseling given: Not Answered Tobacco comments: smoked 1980-1988, up to 1 ppd   Outpatient Medications Prior to Visit  Medication Sig Dispense Refill   albuterol  (VENTOLIN  HFA) 108 (90 Base) MCG/ACT inhaler  Inhale 2 puffs into the lungs every 4 to 6 hours as needed for cough/wheeze. 6.7 g 0   amitriptyline  (ELAVIL ) 10 MG tablet Take 1 tablet (10 mg total) by mouth at bedtime. 90 tablet 1   amLODipine  (NORVASC ) 5 MG tablet Take 1 tablet (5 mg total) by mouth daily. 90 tablet 1   aspirin  EC (ASPIRIN  LOW DOSE) 81 MG tablet Take 1 tablet (81 mg total) by mouth daily for three weeks following surgery 21 tablet 0   budesonide -formoterol  (SYMBICORT ) 80-4.5 MCG/ACT inhaler Inhale 2 puffs by mouth twice daily. 10.2 g 5   EPINEPHrine  0.3 mg/0.3 mL IJ SOAJ injection Inject as directed. 2 each 1   famotidine  (PEPCID ) 20 MG tablet Take 20 mg by mouth daily.     fexofenadine (ALLEGRA) 180 MG tablet Take 180 mg by mouth daily.      fluticasone  (FLONASE ) 50 MCG/ACT nasal spray Place 1 spray into the nose at bedtime.  (Patient taking differently: Place 1 spray into the nose as needed.)     HYDROcodone -acetaminophen  (NORCO/VICODIN) 5-325 MG tablet Take 1 tablet by mouth every 6 (six) hours as needed for pain 20 tablet 0   Ketorolac  Tromethamine  15.75 MG/SPRAY SOLN One spray (15.75 mg) in 1 nostril (total dose: 15.75 mg) every 6 to 8 hours; maximum dose: 4 doses (63 mg)/day 1 each 5   levothyroxine  (SYNTHROID ) 112 MCG tablet Take 1 tablet (112 mcg total) by mouth daily before breakfast. 90 tablet 3   methocarbamol  (ROBAXIN ) 500 MG tablet Take 1 tablet (500 mg total) by mouth every 6 (six) hours as needed for muscle spasms 40 tablet 0   ondansetron  (ZOFRAN ) 4 MG tablet Take 1 tablet (4 mg total) by mouth every 6 (six) hours as needed for nausea. 20 tablet 0   rizatriptan  (MAXALT -MLT) 10 MG disintegrating tablet DISSOLVE 1 TABLET IN MOUTH ONCE DAILY AS NEEDED FOR MIGRAINE. 12 tablet 5   Spacer/Aero-Holding Chambers (OPTICHAMBER DIAMOND ) MISC Use as directed 1 each 1   No facility-administered medications prior to visit.     Review of Systems:   Constitutional:   No  weight loss, night sweats,  Fevers, chills,  +fatigue, or  lassitude.  HEENT:   No headaches,  Difficulty swallowing,  Tooth/dental problems, or  Sore throat,                No sneezing, itching, ear ache, +nasal congestion, post nasal drip,   CV:  No chest pain,  Orthopnea, PND, swelling in lower extremities, anasarca, dizziness, palpitations, syncope.   GI  No heartburn, indigestion, abdominal pain, nausea, vomiting, diarrhea, change in bowel habits, loss of appetite, bloody stools.   Resp: No shortness of breath with exertion or at rest.  No excess mucus, no productive cough,  No non-productive cough,  No coughing up of blood.  No change in color of mucus.  No wheezing.  No chest wall deformity  Skin: no rash or lesions.  GU: no dysuria, change in color of urine, no urgency or frequency.  No flank pain, no hematuria   MS:  No joint pain or swelling.  No decreased range of motion.  No back pain.    Physical Exam  BP 132/79   Pulse 80   Temp 98 F (36.7 C)   Ht 6' (1.829 m) Comment: per pt  Wt 206 lb 12.8 oz (93.8 kg)   SpO2 93% Comment: RA  BMI 28.05 kg/m   GEN: A/Ox3; pleasant , NAD, well nourished    HEENT:  [place]/AT,   NOSE-clear, THROAT-clear, no lesions, no postnasal drip or exudate noted. Class 3 MP airway  NECK:  Supple w/ fair ROM; no JVD; normal carotid impulses w/o bruits; no thyromegaly or nodules palpated; no lymphadenopathy.    RESP  Clear  P & A; w/o, wheezes/ rales/ or rhonchi. no accessory muscle use, no dullness to percussion  CARD:  RRR, no m/r/g, no peripheral edema, pulses intact, no cyanosis or clubbing.  GI:   Soft & nt; nml bowel sounds; no organomegaly or masses detected.   Musco: Warm bil, no deformities or joint swelling noted.   Neuro: alert, no focal deficits noted.    Skin: Warm, no lesions or rashes    Lab Results:Reviewed 09/15/2024     BNP No results found for: BNP  ProBNP No results found for: PROBNP  Imaging: No results found.  Administration History     None            No data to display          No results found for: NITRICOXIDE     06/30/2024   10:00 AM  Results of the [facility] flowsheet  Sitting and reading 0  Watching TV 0  Sitting, inactive in a public place (e.g. a theatre or a meeting) 0  As a passenger in a car for an hour without a break 0  Lying down to rest in the afternoon when circumstances permit 1  Sitting and talking to someone 0  Sitting quietly after a lunch without alcohol 0  In a car, while stopped for a few minutes in traffic 0  Total score 1        Assessment & Plan:   Assessment and Plan Assessment & Plan Severe obstructive sleep apnea  - Severe obstructive sleep apnea on sleep study.  We reviewed his sleep study results in detail went over treatment options including weight loss oral appliance and CPAP therapy.  With underlying severe sleep apnea recommend proceeding with CPAP therapy.. Order CPAP therapy through insurance and DME. Provide information on mask options, including the Dreamwear nasal mask. Instruct on CPAP maintenance and cleaning. Advise on using saline nasal spray and gel to prevent nasal dryness. Discuss the potential for an Inspire implant if CPAP is intolerable. Schedule a follow-up in three months to assess CPAP effectiveness.  Begin auto CPAP 5 to 15 cm H2O. - discussed how weight can impact sleep and risk for sleep disordered breathing - discussed options to assist with weight loss: combination of diet modification, cardiovascular and strength training exercises   - had an extensive discussion regarding the adverse health consequences related to untreated sleep disordered breathing - specifically discussed the risks for hypertension, coronary artery disease, cardiac dysrhythmias, cerebrovascular disease, and diabetes - lifestyle modification discussed   - discussed how sleep disruption can increase risk of accidents, particularly when driving - safe driving practices were  discussed  Essential hypertension  -continue to monitor.  Appears stable.  BMI 28-maintain healthy weight through diet and exercise    Chronic rhinitis with Nasal dryness and irritation   He experiences nasal dryness and irritation, possibly due to environmental factors and CPAP use. Recent epistaxis is likely related to nasal dryness, with no significant nasal abnormalities observed on examination. Use saline nasal spray and gel to alleviate dryness. Monitor for any further nasal symptoms.   Plan  Patient Instructions  Begin CPAP At bedtime, wear all night long  Work on healthy weight  Do not drive if sleepy  Use caution with sedating medications  Follow up in 3 months and As needed          Madelin Stank, NP 09/15/2024

## 2024-09-15 NOTE — Patient Instructions (Signed)
 Begin CPAP At bedtime, wear all night long  Work on healthy weight  Do not drive if sleepy  Use caution with sedating medications  Follow up in 3 months and As needed

## 2024-09-26 ENCOUNTER — Encounter: Payer: Self-pay | Admitting: Pharmacist

## 2024-09-26 ENCOUNTER — Other Ambulatory Visit: Payer: Self-pay

## 2024-09-27 ENCOUNTER — Ambulatory Visit: Admitting: Adult Health

## 2024-10-03 DIAGNOSIS — J3081 Allergic rhinitis due to animal (cat) (dog) hair and dander: Secondary | ICD-10-CM | POA: Diagnosis not present

## 2024-10-03 DIAGNOSIS — J3089 Other allergic rhinitis: Secondary | ICD-10-CM | POA: Diagnosis not present

## 2024-10-03 DIAGNOSIS — J301 Allergic rhinitis due to pollen: Secondary | ICD-10-CM | POA: Diagnosis not present

## 2024-10-04 ENCOUNTER — Other Ambulatory Visit (HOSPITAL_BASED_OUTPATIENT_CLINIC_OR_DEPARTMENT_OTHER): Payer: Self-pay

## 2024-10-04 MED ORDER — FLUZONE 0.5 ML IM SUSY
0.5000 mL | PREFILLED_SYRINGE | Freq: Once | INTRAMUSCULAR | 0 refills | Status: AC
  Filled 2024-10-04: qty 0.5, 1d supply, fill #0

## 2024-10-05 DIAGNOSIS — G4733 Obstructive sleep apnea (adult) (pediatric): Secondary | ICD-10-CM | POA: Diagnosis not present

## 2024-10-17 DIAGNOSIS — T63461D Toxic effect of venom of wasps, accidental (unintentional), subsequent encounter: Secondary | ICD-10-CM | POA: Diagnosis not present

## 2024-10-17 DIAGNOSIS — T63451D Toxic effect of venom of hornets, accidental (unintentional), subsequent encounter: Secondary | ICD-10-CM | POA: Diagnosis not present

## 2024-10-24 DIAGNOSIS — J3089 Other allergic rhinitis: Secondary | ICD-10-CM | POA: Diagnosis not present

## 2024-10-24 DIAGNOSIS — J3081 Allergic rhinitis due to animal (cat) (dog) hair and dander: Secondary | ICD-10-CM | POA: Diagnosis not present

## 2024-10-24 DIAGNOSIS — J301 Allergic rhinitis due to pollen: Secondary | ICD-10-CM | POA: Diagnosis not present

## 2024-11-04 DIAGNOSIS — G4733 Obstructive sleep apnea (adult) (pediatric): Secondary | ICD-10-CM | POA: Diagnosis not present

## 2024-11-14 DIAGNOSIS — J3089 Other allergic rhinitis: Secondary | ICD-10-CM | POA: Diagnosis not present

## 2024-11-14 DIAGNOSIS — J3081 Allergic rhinitis due to animal (cat) (dog) hair and dander: Secondary | ICD-10-CM | POA: Diagnosis not present

## 2024-11-14 DIAGNOSIS — J301 Allergic rhinitis due to pollen: Secondary | ICD-10-CM | POA: Diagnosis not present

## 2024-12-18 ENCOUNTER — Other Ambulatory Visit: Payer: Self-pay | Admitting: Physician Assistant

## 2024-12-18 ENCOUNTER — Other Ambulatory Visit: Payer: Self-pay | Admitting: Family Medicine

## 2024-12-19 ENCOUNTER — Other Ambulatory Visit (HOSPITAL_COMMUNITY): Payer: Self-pay

## 2024-12-19 ENCOUNTER — Other Ambulatory Visit: Payer: Self-pay

## 2024-12-19 MED ORDER — AMLODIPINE BESYLATE 5 MG PO TABS
5.0000 mg | ORAL_TABLET | Freq: Every day | ORAL | 1 refills | Status: AC
  Filled 2024-12-19: qty 90, 90d supply, fill #0

## 2024-12-19 MED ORDER — AMITRIPTYLINE HCL 10 MG PO TABS
10.0000 mg | ORAL_TABLET | Freq: Every day | ORAL | 1 refills | Status: AC
  Filled 2024-12-19: qty 90, 90d supply, fill #0

## 2024-12-19 MED ORDER — LEVOTHYROXINE SODIUM 112 MCG PO TABS
112.0000 ug | ORAL_TABLET | Freq: Every day | ORAL | 3 refills | Status: AC
  Filled 2024-12-19: qty 90, 90d supply, fill #0

## 2024-12-26 ENCOUNTER — Ambulatory Visit: Admitting: Adult Health

## 2025-01-03 ENCOUNTER — Encounter: Payer: Commercial Managed Care - PPO | Admitting: Family Medicine
